# Patient Record
Sex: Female | Born: 1960
Health system: Southern US, Community
[De-identification: ages and names within clinical notes are randomized; demographics above are authoritative.]

## PROBLEM LIST (undated history)

## (undated) DIAGNOSIS — I351 Nonrheumatic aortic (valve) insufficiency: Secondary | ICD-10-CM

## (undated) DIAGNOSIS — Z0389 Encounter for observation for other suspected diseases and conditions ruled out: Secondary | ICD-10-CM

## (undated) DIAGNOSIS — G709 Myoneural disorder, unspecified: Secondary | ICD-10-CM

## (undated) DIAGNOSIS — F3181 Bipolar II disorder: Secondary | ICD-10-CM

## (undated) DIAGNOSIS — G35D Multiple sclerosis, unspecified: Secondary | ICD-10-CM

## (undated) DIAGNOSIS — I471 Supraventricular tachycardia: Principal | ICD-10-CM

## (undated) DIAGNOSIS — T7840XA Allergy, unspecified, initial encounter: Secondary | ICD-10-CM

## (undated) DIAGNOSIS — E559 Vitamin D deficiency, unspecified: Secondary | ICD-10-CM

## (undated) DIAGNOSIS — E785 Hyperlipidemia, unspecified: Secondary | ICD-10-CM

## (undated) DIAGNOSIS — N3281 Overactive bladder: Secondary | ICD-10-CM

## (undated) DIAGNOSIS — I493 Ventricular premature depolarization: Secondary | ICD-10-CM

## (undated) DIAGNOSIS — G35 Multiple sclerosis: Secondary | ICD-10-CM

## (undated) DIAGNOSIS — IMO0001 Reserved for inherently not codable concepts without codable children: Secondary | ICD-10-CM

## (undated) DIAGNOSIS — F419 Anxiety disorder, unspecified: Secondary | ICD-10-CM

## (undated) DIAGNOSIS — I340 Nonrheumatic mitral (valve) insufficiency: Secondary | ICD-10-CM

## (undated) DIAGNOSIS — F32A Depression, unspecified: Secondary | ICD-10-CM

## (undated) DIAGNOSIS — D72819 Decreased white blood cell count, unspecified: Secondary | ICD-10-CM

## (undated) HISTORY — DX: Multiple sclerosis, unspecified: G35.D

## (undated) HISTORY — DX: Hyperlipidemia, unspecified: E78.5

## (undated) HISTORY — DX: Multiple sclerosis: G35

## (undated) HISTORY — DX: Nonrheumatic mitral (valve) insufficiency: I34.0

## (undated) HISTORY — DX: Nonrheumatic aortic (valve) insufficiency: I35.1

## (undated) HISTORY — DX: Depression, unspecified: F32.A

## (undated) HISTORY — PX: CARPAL TUNNEL RELEASE: SHX101

## (undated) HISTORY — DX: Overactive bladder: N32.81

## (undated) HISTORY — DX: Encounter for observation for other suspected diseases and conditions ruled out: Z03.89

## (undated) HISTORY — DX: Reserved for inherently not codable concepts without codable children: IMO0001

## (undated) HISTORY — DX: Allergy, unspecified, initial encounter: T78.40XA

## (undated) HISTORY — DX: Anxiety disorder, unspecified: F41.9

## (undated) HISTORY — DX: Vitamin D deficiency, unspecified: E55.9

## (undated) HISTORY — DX: Ventricular premature depolarization: I49.3

## (undated) HISTORY — DX: Myoneural disorder, unspecified: G70.9

## (undated) HISTORY — DX: Decreased white blood cell count, unspecified: D72.819

## (undated) HISTORY — DX: Supraventricular tachycardia: I47.1

## (undated) HISTORY — DX: Bipolar II disorder: F31.81

## (undated) HISTORY — PX: CHOLECYSTECTOMY: SHX55

## (undated) HISTORY — PX: OTHER SURGICAL HISTORY: SHX169

---

## 2000-07-22 HISTORY — PX: BREAST BIOPSY: SHX20

## 2008-07-22 HISTORY — PX: COLONOSCOPY: SHX174

## 2008-07-22 HISTORY — PX: UPPER GASTROINTESTINAL ENDOSCOPY: SHX188

## 2008-07-22 HISTORY — PX: POLYPECTOMY: SHX149

## 2013-04-20 LAB — HM MAMMOGRAPHY

## 2016-04-09 DIAGNOSIS — N329 Bladder disorder, unspecified: Secondary | ICD-10-CM | POA: Insufficient documentation

## 2016-04-09 DIAGNOSIS — F29 Unspecified psychosis not due to a substance or known physiological condition: Secondary | ICD-10-CM | POA: Insufficient documentation

## 2016-04-09 DIAGNOSIS — I493 Ventricular premature depolarization: Secondary | ICD-10-CM

## 2016-04-09 DIAGNOSIS — G9332 Myalgic encephalomyelitis/chronic fatigue syndrome: Secondary | ICD-10-CM | POA: Insufficient documentation

## 2016-04-09 DIAGNOSIS — R5382 Chronic fatigue, unspecified: Secondary | ICD-10-CM

## 2016-04-09 DIAGNOSIS — R002 Palpitations: Secondary | ICD-10-CM | POA: Insufficient documentation

## 2016-04-09 DIAGNOSIS — G43909 Migraine, unspecified, not intractable, without status migrainosus: Secondary | ICD-10-CM | POA: Insufficient documentation

## 2016-04-09 DIAGNOSIS — N3090 Cystitis, unspecified without hematuria: Secondary | ICD-10-CM | POA: Insufficient documentation

## 2016-04-09 DIAGNOSIS — N6019 Diffuse cystic mastopathy of unspecified breast: Secondary | ICD-10-CM | POA: Insufficient documentation

## 2016-04-09 DIAGNOSIS — M797 Fibromyalgia: Secondary | ICD-10-CM | POA: Insufficient documentation

## 2016-04-09 DIAGNOSIS — F411 Generalized anxiety disorder: Secondary | ICD-10-CM | POA: Insufficient documentation

## 2016-04-09 HISTORY — DX: Ventricular premature depolarization: I49.3

## 2016-06-16 LAB — FECAL OCCULT BLOOD, IMMUNOCHEMICAL: IMMUNOLOGICAL FECAL OCCULT BLOOD TEST: NEGATIVE

## 2017-01-10 DIAGNOSIS — F3181 Bipolar II disorder: Secondary | ICD-10-CM | POA: Diagnosis not present

## 2017-01-28 DIAGNOSIS — B369 Superficial mycosis, unspecified: Secondary | ICD-10-CM | POA: Diagnosis not present

## 2017-02-17 DIAGNOSIS — F3181 Bipolar II disorder: Secondary | ICD-10-CM | POA: Diagnosis not present

## 2017-02-26 ENCOUNTER — Encounter: Payer: Self-pay | Admitting: Family Medicine

## 2017-02-26 ENCOUNTER — Ambulatory Visit (INDEPENDENT_AMBULATORY_CARE_PROVIDER_SITE_OTHER): Payer: Medicare PPO | Admitting: Family Medicine

## 2017-02-26 ENCOUNTER — Telehealth: Payer: Self-pay

## 2017-02-26 VITALS — BP 138/80 | HR 75 | Ht 66.0 in | Wt 223.8 lb

## 2017-02-26 DIAGNOSIS — Z7689 Persons encountering health services in other specified circumstances: Secondary | ICD-10-CM

## 2017-02-26 DIAGNOSIS — F3181 Bipolar II disorder: Secondary | ICD-10-CM

## 2017-02-26 DIAGNOSIS — R300 Dysuria: Secondary | ICD-10-CM

## 2017-02-26 DIAGNOSIS — E785 Hyperlipidemia, unspecified: Secondary | ICD-10-CM | POA: Diagnosis not present

## 2017-02-26 DIAGNOSIS — N3281 Overactive bladder: Secondary | ICD-10-CM

## 2017-02-26 DIAGNOSIS — D72819 Decreased white blood cell count, unspecified: Secondary | ICD-10-CM

## 2017-02-26 DIAGNOSIS — G35 Multiple sclerosis: Secondary | ICD-10-CM | POA: Diagnosis not present

## 2017-02-26 DIAGNOSIS — E559 Vitamin D deficiency, unspecified: Secondary | ICD-10-CM | POA: Diagnosis not present

## 2017-02-26 DIAGNOSIS — F319 Bipolar disorder, unspecified: Secondary | ICD-10-CM | POA: Diagnosis not present

## 2017-02-26 LAB — POCT URINALYSIS DIP (PROADVANTAGE DEVICE)
Bilirubin, UA: NEGATIVE
GLUCOSE UA: NEGATIVE mg/dL
Ketones, POC UA: NEGATIVE mg/dL
LEUKOCYTES UA: NEGATIVE
NITRITE UA: NEGATIVE
Protein Ur, POC: NEGATIVE mg/dL
RBC UA: NEGATIVE
Specific Gravity, Urine: 1.02
UUROB: NEGATIVE
pH, UA: 6.5 (ref 5.0–8.0)

## 2017-02-26 LAB — CBC WITH DIFFERENTIAL/PLATELET
Basophils Absolute: 0 cells/uL (ref 0–200)
Basophils Relative: 0 %
Eosinophils Absolute: 88 cells/uL (ref 15–500)
Eosinophils Relative: 4 %
HEMATOCRIT: 38.2 % (ref 35.0–45.0)
HEMOGLOBIN: 12.5 g/dL (ref 11.7–15.5)
Lymphocytes Relative: 13 %
Lymphs Abs: 286 cells/uL — ABNORMAL LOW (ref 850–3900)
MCH: 28.3 pg (ref 27.0–33.0)
MCHC: 32.7 g/dL (ref 32.0–36.0)
MCV: 86.6 fL (ref 80.0–100.0)
MPV: 9.8 fL (ref 7.5–12.5)
Monocytes Absolute: 220 cells/uL (ref 200–950)
Monocytes Relative: 10 %
NEUTROS PCT: 73 %
Neutro Abs: 1606 cells/uL (ref 1500–7800)
Platelets: 142 10*3/uL (ref 140–400)
RBC: 4.41 MIL/uL (ref 3.80–5.10)
RDW: 13.4 % (ref 11.0–15.0)
WBC: 2.2 10*3/uL — AB (ref 4.0–10.5)

## 2017-02-26 MED ORDER — OXYBUTYNIN CHLORIDE ER 10 MG PO TB24: 10.0000 mg | ORAL_TABLET | Freq: Every day | ORAL | 1 refills | Status: DC

## 2017-02-26 NOTE — Telephone Encounter (Signed)
Release of Information faxed to [facility].

## 2017-02-26 NOTE — Progress Notes (Signed)
Subjective:    Patient ID: Sarah Hogan, female    DOB: [birth year], 56 y.o.   MRN: [id number]  HPI Chief Complaint  Patient presents with  . new pt    new pt get established. possible uti. no other concerns. pt needs refill on oxybutynin which is the only think that her previous pcp filled   She is new to the practice and here to establish care. Moved here 4 months ago from [facility] to be near her younger brother and sister-in-law.  Previous medical care: [facility] [place] she has a complex medical history but that she usually sees specialists for most of her problems.   Her concern today is that she has had some intermittent dysuria. She would like to make sure she does not have a UTI. States she has a difficult time telling because her norm is to have urinary urgency and occasional incontinence. Denies urinary frequency, hematuria, urgency.   Other providers: psychiatrist- Dr. Creig Hines at [facility]. Neurologist - has appointment at [facility] in September.  Cardiologist- Dr. Radford Pax. Dermatologist- needs one.   Bipolar disorder. States she is depressed most days. States her depression is disabling her. Denies SI or HI.  MS- diagnosed in 1990. Reports having mild memory issues.  States she has history of heart palpitations.  Hyperlipidemia- taking pravastatin and Zetia.  Overactive bladder. Has done pelvic floor therapy. States she was offered botox in [facility] but refused. Is taking Oxybutynin. States she was taking Myrbetriq and Oxybutynin in the past together. Her PCP in [place] was managing this for her. States she "fired" her urologist.   Vitamin D deficiency- has been taking 50,000 IU once weekly for several years.   Social history: Lives with alone with her dog and cat in an apartment. 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  Some college.   Diet: states she has an addiction to sugar. Diet is fairly unhealthy.    Reviewed allergies, medications, past medical, surgical, and social  history.   Review of Systems Pertinent positives and negatives in the history of present illness.     Objective:   Physical Exam BP 138/80 Comment: anxious and was late getting to appt  Pulse 75   Ht 5\' 6"  (1.676 m)   Wt 223 lb 12.8 oz (101.5 kg)   LMP 06/28/2008   BMI 36.12 kg/m   Alert and oriented and in no acute distress. Not otherwise examined.   UA dipstick: negative       Assessment & Plan:  Dysuria - Plan: POCT Urinalysis DIP (Proadvantage Device)  Encounter to establish care  Overactive bladder - Plan: POCT Urinalysis DIP (Proadvantage Device), CBC with Differential/Platelet, Comprehensive metabolic panel  MS (multiple sclerosis) (HCC) - Plan: POCT Urinalysis DIP (Proadvantage Device), CBC with Differential/Platelet, Comprehensive metabolic panel  Hyperlipidemia, unspecified hyperlipidemia type - Plan: Lipid panel  Vitamin D deficiency - Plan: VITAMIN D 25 Hydroxy (Vit-D Deficiency, Fractures)  Bipolar 2 disorder (HCC) - Plan: POCT Urinalysis DIP (Proadvantage Device), CBC with Differential/Platelet, Comprehensive metabolic panel  Leukopenia, unspecified type  Discussed that she does not appear to have a UTI. Offered antibiotic based on her symptoms but she declines, states unless she has an obvious infection she does not do well with antibiotics. She will increase water intake and keep me posted if her symptoms worsen.  She will follow up with her specialists for MS and bipolar disorder.  She does not appear to be in any danger and denies SI.  Will check  labs including lipids per patient request.  Vitamin D level ordered.  She brought records from her PCP in [facility] and I will review these.  Follow up pending labs.

## 2017-02-27 LAB — LIPID PANEL
CHOLESTEROL: 221 mg/dL — AB (ref <200)
HDL: 53 mg/dL (ref >50)
LDL CALC: 130 mg/dL — AB (ref <100)
TRIGLYCERIDES: 188 mg/dL — AB (ref <150)
Total CHOL/HDL Ratio: 4.2 Ratio (ref <5.0)
VLDL: 38 mg/dL — AB (ref <30)

## 2017-02-27 LAB — COMPREHENSIVE METABOLIC PANEL
ALBUMIN: 4.2 g/dL (ref 3.6–5.1)
ALT: 12 U/L (ref 6–29)
AST: 15 U/L (ref 10–35)
Alkaline Phosphatase: 50 U/L (ref 33–130)
BUN: 19 mg/dL (ref 7–25)
CALCIUM: 9.3 mg/dL (ref 8.6–10.4)
CO2: 21 mmol/L (ref 20–32)
Chloride: 110 mmol/L (ref 98–110)
Creat: 1.13 mg/dL — ABNORMAL HIGH (ref 0.50–1.05)
GLUCOSE: 87 mg/dL (ref 65–99)
POTASSIUM: 4.4 mmol/L (ref 3.5–5.3)
Sodium: 143 mmol/L (ref 135–146)
Total Bilirubin: 0.3 mg/dL (ref 0.2–1.2)
Total Protein: 6.5 g/dL (ref 6.1–8.1)

## 2017-02-27 LAB — VITAMIN D 25 HYDROXY (VIT D DEFICIENCY, FRACTURES): Vit D, 25-Hydroxy: 40 ng/mL (ref 30–100)

## 2017-03-12 DIAGNOSIS — E785 Hyperlipidemia, unspecified: Secondary | ICD-10-CM | POA: Diagnosis not present

## 2017-03-12 DIAGNOSIS — I10 Essential (primary) hypertension: Secondary | ICD-10-CM | POA: Diagnosis not present

## 2017-03-12 DIAGNOSIS — Z6835 Body mass index (BMI) 35.0-35.9, adult: Secondary | ICD-10-CM | POA: Diagnosis not present

## 2017-03-12 DIAGNOSIS — F330 Major depressive disorder, recurrent, mild: Secondary | ICD-10-CM | POA: Diagnosis not present

## 2017-03-12 DIAGNOSIS — M545 Low back pain: Secondary | ICD-10-CM | POA: Diagnosis not present

## 2017-03-25 DIAGNOSIS — F3181 Bipolar II disorder: Secondary | ICD-10-CM | POA: Diagnosis not present

## 2017-04-03 DIAGNOSIS — G35 Multiple sclerosis: Secondary | ICD-10-CM | POA: Diagnosis not present

## 2017-04-09 ENCOUNTER — Encounter: Payer: Self-pay | Admitting: *Deleted

## 2017-04-22 ENCOUNTER — Ambulatory Visit (INDEPENDENT_AMBULATORY_CARE_PROVIDER_SITE_OTHER): Payer: Medicare PPO | Admitting: Cardiology

## 2017-04-22 ENCOUNTER — Encounter: Payer: Self-pay | Admitting: Cardiology

## 2017-04-22 VITALS — BP 126/74 | HR 67 | Ht 66.0 in | Wt 223.2 lb

## 2017-04-22 DIAGNOSIS — I340 Nonrheumatic mitral (valve) insufficiency: Secondary | ICD-10-CM | POA: Diagnosis not present

## 2017-04-22 DIAGNOSIS — IMO0001 Reserved for inherently not codable concepts without codable children: Secondary | ICD-10-CM | POA: Insufficient documentation

## 2017-04-22 DIAGNOSIS — I471 Supraventricular tachycardia, unspecified: Secondary | ICD-10-CM

## 2017-04-22 DIAGNOSIS — I351 Nonrheumatic aortic (valve) insufficiency: Secondary | ICD-10-CM | POA: Insufficient documentation

## 2017-04-22 DIAGNOSIS — E7800 Pure hypercholesterolemia, unspecified: Secondary | ICD-10-CM | POA: Diagnosis not present

## 2017-04-22 DIAGNOSIS — Z0389 Encounter for observation for other suspected diseases and conditions ruled out: Secondary | ICD-10-CM

## 2017-04-22 DIAGNOSIS — I493 Ventricular premature depolarization: Secondary | ICD-10-CM

## 2017-04-22 HISTORY — DX: Ventricular premature depolarization: I49.3

## 2017-04-22 HISTORY — DX: Supraventricular tachycardia: I47.1

## 2017-04-22 HISTORY — DX: Supraventricular tachycardia, unspecified: I47.10

## 2017-04-22 NOTE — Patient Instructions (Signed)

## 2017-04-22 NOTE — Progress Notes (Signed)
Cardiology Office Note    Date:  04/22/2017   ID:  Sarah Hogan, DOB Jun 06, 1961, MRN [id number]  PCP:  Girtha Rm, NP-C  Cardiologist:  Fransico Him, MD   Chief Complaint  Patient presents with  . Irregular Heart Beat  . Mitral Regurgitation    History of Present Illness:  Sarah Hogan is a 56 y.o. female who is being seen today for the evaluation of PACs, PVCs, SVT  at the request of Raenette Rover, Vickie L, NP-C.  This is a 56yo female with a history of hyperlipidemia, MS, GERD, migraines, bipolar disorder, tobacco abuse with 40 pk year history of smoking and normal coronary arteries by cath for positive stress test done for fagitue in 2015.  She has a history of palpitations and event monitor earlier this year showed SVT with PACs and PVCs. Her echo 08/2016 showed normal LVF with ER 65-70% with mild AR and MR.  She has recently moved to [facility] and is here to establish care.    She is doing well.  She denies any chest pain or pressure,  PND, orthopnea, LE edema, dizziness, palpitations or syncope. She is not used to walking up hills. She lived in [place] where it was flat so she has noticed that she does get DOE with walking her dog but she also has allergies. She is compliant with her meds and is tolerating meds with no SE.     Past Medical History:  Diagnosis Date  . Bipolar 2 disorder ([place])   . Hyperlipidemia   . Leukopenia   . Mild aortic insufficiency    by echo 07/2016  . Mild mitral regurgitation    by echo 07/2016  . MS (multiple sclerosis) ([place])   . Normal coronary arteries cath 2015  . Overactive bladder   . PVC's (premature ventricular contractions) 04/22/2017  . SVT (supraventricular tachycardia) ([place]) 04/22/2017  . Vitamin D deficiency     Past Surgical History:  Procedure Laterality Date  . CARPAL TUNNEL RELEASE    . CHOLECYSTECTOMY      Current Medications: Current Meds  Medication Sig  . baclofen (LIORESAL) 10 MG tablet Take 10 mg by mouth 3 (three) times  daily.  . busPIRone (BUSPAR) 10 MG tablet Take 10 mg by mouth daily.  . clorazepate (TRANXENE) 3.75 MG tablet Take 3.75 mg by mouth as needed for anxiety.  Marland Kitchen ezetimibe (ZETIA) 10 MG tablet Take 10 mg by mouth daily.  . Fingolimod HCl (GILENYA) 0.5 MG CAPS Take 1 capsule by mouth daily.  . metoprolol succinate (TOPROL XL) 50 MG 24 hr tablet Take 50 mg by mouth daily. Take with or immediately following a meal. MUST HAVE BRAND NAME ONLY  . modafinil (PROVIGIL) 100 MG tablet Take 100 mg by mouth as needed.  . NON FORMULARY Uses [employer] things  . OXcarbazepine (TRILEPTAL) 150 MG tablet Take 150 mg by mouth 2 (two) times daily.  Marland Kitchen oxybutynin (DITROPAN-XL) 10 MG 24 hr tablet Take 1 tablet (10 mg total) by mouth at bedtime.  . pravastatin (PRAVACHOL) 40 MG tablet Take 40 mg by mouth daily.  . pregabalin (LYRICA) 50 MG capsule Take 50 mg by mouth daily.  Marland Kitchen topiramate (TOPAMAX) 50 MG tablet Take 50 mg by mouth 2 (two) times daily.  Marland Kitchen venlafaxine XR (EFFEXOR-XR) 75 MG 24 hr capsule Take 75 mg by mouth daily with breakfast.  . Vitamin D, Ergocalciferol, (DRISDOL) 50000 units CAPS capsule Take 50,000 Units by mouth every 7 (seven) days.  Allergies:   Amoxicillin   Social History   Social History  . Marital status: Single    Spouse name: N/A  . Number of children: N/A  . Years of education: N/A   Social History Main Topics  . Smoking status: Former Smoker    Packs/day: 1.50    Years: 20.00    Types: Cigarettes    Quit date: 10/28/1994  . Smokeless tobacco: Never Used  . Alcohol use Yes     Comment: rarely   . Drug use: No  . Sexual activity: Not Asked   Other Topics Concern  . None   Social History Narrative  . None     Family History:  The patient's family history is not on file.   ROS:   Please see the history of present illness.    ROS   She complains of depression, anxiety, and balance problems. All other systems reviewed and are negative.  No flowsheet data  found.     PHYSICAL EXAM:   VS:  BP 126/74   Pulse 67   Ht 5\' 6"  (1.676 m)   Wt 223 lb 3.2 oz (101.2 kg)   LMP 06/28/2008   BMI 36.03 kg/m    GEN: Well nourished, well developed, in no acute distress  HEENT: normal  Neck: no JVD, carotid bruits, or masses Cardiac: RRR; no murmurs, rubs, or gallops,no edema.  Intact distal pulses bilaterally.  Respiratory:  clear to auscultation bilaterally, normal work of breathing GI: soft, nontender, nondistended, + BS MS: no deformity or atrophy  Skin: warm and dry, no rash Neuro:  Alert and Oriented x 3, Strength and sensation are intact Psych: euthymic mood, full affect  Wt Readings from Last 3 Encounters:  04/22/17 223 lb 3.2 oz (101.2 kg)  02/26/17 223 lb 12.8 oz (101.5 kg)      Studies/Labs Reviewed:   EKG:  EKG is ordered today.  The ekg ordered today demonstrates NSR at 67bpm with nonspecific T wave abnormality  Recent Labs: 02/26/2017: ALT 12; BUN 19; Creat 1.13; Hemoglobin 12.5; Platelets 142; Potassium 4.4; Sodium 143   Lipid Panel    Component Value Date/Time   CHOL 221 (H) 02/26/2017 1126   TRIG 188 (H) 02/26/2017 1126   HDL 53 02/26/2017 1126   CHOLHDL 4.2 02/26/2017 1126   VLDL 38 (H) 02/26/2017 1126   LDLCALC 130 (H) 02/26/2017 1126    Additional studies/ records that were reviewed today include:  Records from prior cardiologist    ASSESSMENT:    1. SVT (supraventricular tachycardia) ([place])   2. PVC's (premature ventricular contractions)   3. Mild mitral regurgitation   4. Pure hypercholesterolemia      PLAN:  In order of problems listed above:  1. SVT - this was noted on event monitor earlier this year.  This is well controlled on Toprol XL 50mg  daily.    2. PVCs - these are fairly asymptomatic and well controlled on BB.  3. Mild MR/AR by echo 07/2016 - repeat echo in 3 years.    4. Hyperlipidemia - followed by PCP.  She will continue on Pravastatin 40mg  daily and Zetia 10mg  daily.  Her last LDL  was 130 and recommend that it be maintained < 130.    Medication Adjustments/Labs and Tests Ordered: Current medicines are reviewed at length with the patient today.  Concerns regarding medicines are outlined above.  Medication changes, Labs and Tests ordered today are listed in the Patient Instructions below.  There are no  Patient Instructions on file for this visit.   Signed, Fransico Him, MD  04/22/2017 2:13 PM    [facility] [place], [place], [place]  [address] Phone: [phone]; Fax: [phone]

## 2017-04-28 ENCOUNTER — Ambulatory Visit (INDEPENDENT_AMBULATORY_CARE_PROVIDER_SITE_OTHER): Payer: Medicare PPO | Admitting: Family Medicine

## 2017-04-28 ENCOUNTER — Encounter: Payer: Self-pay | Admitting: Family Medicine

## 2017-04-28 VITALS — BP 112/68 | HR 61 | Temp 97.5°F | Wt 230.6 lb

## 2017-04-28 DIAGNOSIS — J209 Acute bronchitis, unspecified: Secondary | ICD-10-CM | POA: Diagnosis not present

## 2017-04-28 DIAGNOSIS — R058 Other specified cough: Secondary | ICD-10-CM

## 2017-04-28 DIAGNOSIS — R05 Cough: Secondary | ICD-10-CM

## 2017-04-28 MED ORDER — SULFAMETHOXAZOLE-TRIMETHOPRIM 800-160 MG PO TABS: 1.0000 | ORAL_TABLET | Freq: Two times a day (BID) | ORAL | 0 refills | Status: DC

## 2017-04-28 NOTE — Patient Instructions (Signed)
If you are not back to baseline after completing the antibiotic call me.

## 2017-04-28 NOTE — Progress Notes (Signed)
Chief Complaint  Patient presents with  . Cough    lots of mucous in chest   Subjective:  Sarah Hogan is a 56 y.o. female who presents for a 3-4 week history of URI symptoms that started with rhinorrhea, sneezing, cough.and fever for 2 days. She now complains of persistent cough as well. States she typically is not sick for long periods but her immune system is weak due to MS and medications.   Denies ear pain, sore throat, chest pain, palpitations, abdominal pain, N/V/D. No LE edema.   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 She would like a letter stating that she is healthy enough to participate in more vigorous exercise. She recently saw her cardiologist but did not ask her about this.   Treatment to date: Delsym and Mucinex .  Denies sick contacts.  No other aggravating or relieving factors.  No other c/o.  ROS as in subjective.   Objective: Vitals:   04/28/17 1427  BP: 112/68  Pulse: 61  Temp: (!) 97.5 F (36.4 C)  SpO2: 99%    General appearance: Alert, WD/WN, no distress, mildly ill appearing                             Skin: warm, no rash                           Head: no sinus tenderness                            Eyes: conjunctiva normal, corneas clear, PERRLA                            Ears: pearly TMs, external ear canals normal                          Nose: septum midline, turbinates swollen, with erythema and clear discharge             Mouth/throat: MMM, tongue normal, mild pharyngeal erythema                           Neck: supple, no adenopathy, no thyromegaly, nontender                          Heart: RRR, normal S1, S2, no murmurs                         Lungs: CTA bilaterally, no wheezes, rales, or rhonchi      Assessment: Cough present for greater than 3 weeks  Acute bronchitis, unspecified organism   Plan: Discussed diagnosis and treatment of persistent cough. She is allergic to Amoxicillin and high risk of QT prolongation  with Z-pak and MS medications. She states she has done well with Bactrim in the past and would like this to be prescribed.   Suggested symptomatic OTC remedies. Nasal saline spray for congestion.  Tylenol or Ibuprofen OTC for fever and malaise.  Call/return if her symptoms worsen or if not back to baseline after completing the antibiotic. She is aware that we may need to send her for a chest XR or switch her antibiotic to Doxycycline or a  fluoroquinolone if not improving.

## 2017-04-30 ENCOUNTER — Telehealth: Payer: Self-pay | Admitting: Cardiology

## 2017-04-30 NOTE — Telephone Encounter (Signed)
New Message  Pt call requesting to speak with RN about getting a letter of consent from Dr. Radford Pax for pt to be able to take an exercise class. Please call back to discuss

## 2017-04-30 NOTE — Telephone Encounter (Signed)
Pt calling to inform Dr Radford Pax that she will need a clearance letter to provide her new Trainer at [employer].  Pt states she is trying to get into an exercise regimen and has started to attend [employer].  Pt states she is getting ready to start exercising with a Trainer at the gym, and before she can start sessions she will need a clearance note written by Dr Radford Pax.  Informed the pt that I will forward this request to Dr Radford Pax to advise on letter needed, and follow-up with the pt once this is received. Pt verbalized understanding and agrees with this plan.

## 2017-05-02 ENCOUNTER — Encounter: Payer: Self-pay | Admitting: *Deleted

## 2017-05-02 NOTE — Telephone Encounter (Signed)
Patient is stable from a cardiac standpoint to participate in an exercise program.

## 2017-05-02 NOTE — Telephone Encounter (Signed)
Notified the pt that her clearance letter to exercise with her trainer was written by Dr Radford Pax and available to pick up at her earliest convenience.  Per the pt, she prefers this to be mailed to her confirmed mailing address on file.  Informed the pt that I will mail this today.  Pt verbalized understanding and gracious for all the assistance provided.

## 2017-05-06 DIAGNOSIS — F3181 Bipolar II disorder: Secondary | ICD-10-CM | POA: Diagnosis not present

## 2017-05-27 ENCOUNTER — Telehealth: Payer: Self-pay | Admitting: Family Medicine

## 2017-05-27 NOTE — Telephone Encounter (Signed)
Recv'd fax from [facility] t# [phone] requesting refill Oxybutynin ER 10mg , I do not see where pt uses this pharmacy so I left message for pt

## 2017-05-28 MED ORDER — OXYBUTYNIN CHLORIDE ER 10 MG PO TB24: 10.0000 mg | ORAL_TABLET | Freq: Every day | ORAL | 1 refills | Status: DC

## 2017-05-28 NOTE — Telephone Encounter (Signed)
Is this okay to refill? 

## 2017-05-28 NOTE — Telephone Encounter (Signed)
Ok

## 2017-05-28 NOTE — Telephone Encounter (Signed)
Pt called back & states she does want all her maintenance meds filled at [facility] instead of [facility] mail order.  Please send in refill Oxybutynin ER 10mg  90 days

## 2017-05-28 NOTE — Telephone Encounter (Signed)
Sent in med to [facility]

## 2017-06-03 ENCOUNTER — Ambulatory Visit: Payer: Medicare PPO | Admitting: Psychology

## 2017-06-10 ENCOUNTER — Ambulatory Visit: Payer: Medicare PPO | Admitting: Family Medicine

## 2017-06-16 DIAGNOSIS — G35 Multiple sclerosis: Secondary | ICD-10-CM | POA: Diagnosis not present

## 2017-06-25 ENCOUNTER — Ambulatory Visit: Payer: Medicare PPO | Admitting: Psychology

## 2017-06-25 DIAGNOSIS — F3130 Bipolar disorder, current episode depressed, mild or moderate severity, unspecified: Secondary | ICD-10-CM

## 2017-06-30 ENCOUNTER — Ambulatory Visit: Payer: Medicare PPO | Admitting: Psychology

## 2017-07-08 DIAGNOSIS — F3181 Bipolar II disorder: Secondary | ICD-10-CM | POA: Diagnosis not present

## 2017-08-06 ENCOUNTER — Ambulatory Visit: Payer: Medicare PPO | Admitting: Psychology

## 2017-08-06 DIAGNOSIS — F3181 Bipolar II disorder: Secondary | ICD-10-CM

## 2017-08-19 DIAGNOSIS — Z87891 Personal history of nicotine dependence: Secondary | ICD-10-CM | POA: Diagnosis not present

## 2017-08-19 DIAGNOSIS — G35 Multiple sclerosis: Secondary | ICD-10-CM | POA: Diagnosis not present

## 2017-08-19 DIAGNOSIS — F3181 Bipolar II disorder: Secondary | ICD-10-CM | POA: Diagnosis not present

## 2017-08-20 ENCOUNTER — Ambulatory Visit (INDEPENDENT_AMBULATORY_CARE_PROVIDER_SITE_OTHER): Payer: Medicare PPO | Admitting: Psychology

## 2017-08-20 DIAGNOSIS — F3181 Bipolar II disorder: Secondary | ICD-10-CM

## 2017-08-30 DIAGNOSIS — R399 Unspecified symptoms and signs involving the genitourinary system: Secondary | ICD-10-CM | POA: Diagnosis not present

## 2017-09-03 ENCOUNTER — Ambulatory Visit (INDEPENDENT_AMBULATORY_CARE_PROVIDER_SITE_OTHER): Payer: Medicare PPO | Admitting: Psychology

## 2017-09-03 DIAGNOSIS — F3181 Bipolar II disorder: Secondary | ICD-10-CM | POA: Diagnosis not present

## 2017-09-17 ENCOUNTER — Ambulatory Visit (INDEPENDENT_AMBULATORY_CARE_PROVIDER_SITE_OTHER): Payer: Medicare PPO | Admitting: Psychology

## 2017-09-17 DIAGNOSIS — F3181 Bipolar II disorder: Secondary | ICD-10-CM | POA: Diagnosis not present

## 2017-09-30 DIAGNOSIS — F3181 Bipolar II disorder: Secondary | ICD-10-CM | POA: Diagnosis not present

## 2017-10-01 ENCOUNTER — Ambulatory Visit (INDEPENDENT_AMBULATORY_CARE_PROVIDER_SITE_OTHER): Payer: Medicare PPO | Admitting: Psychology

## 2017-10-01 DIAGNOSIS — F3181 Bipolar II disorder: Secondary | ICD-10-CM | POA: Diagnosis not present

## 2017-10-15 ENCOUNTER — Ambulatory Visit (INDEPENDENT_AMBULATORY_CARE_PROVIDER_SITE_OTHER): Payer: Medicare PPO | Admitting: Psychology

## 2017-10-15 DIAGNOSIS — F3181 Bipolar II disorder: Secondary | ICD-10-CM

## 2017-10-29 ENCOUNTER — Ambulatory Visit (INDEPENDENT_AMBULATORY_CARE_PROVIDER_SITE_OTHER): Payer: Medicare PPO | Admitting: Psychology

## 2017-10-29 DIAGNOSIS — F3181 Bipolar II disorder: Secondary | ICD-10-CM | POA: Diagnosis not present

## 2017-11-12 ENCOUNTER — Ambulatory Visit: Payer: Medicare PPO | Admitting: Psychology

## 2017-11-17 DIAGNOSIS — F3181 Bipolar II disorder: Secondary | ICD-10-CM | POA: Diagnosis not present

## 2017-11-24 ENCOUNTER — Other Ambulatory Visit: Payer: Self-pay | Admitting: Family Medicine

## 2017-11-24 NOTE — Telephone Encounter (Signed)
Is this okay to refill? 

## 2017-11-24 NOTE — Telephone Encounter (Signed)
ok 

## 2017-11-26 ENCOUNTER — Ambulatory Visit (INDEPENDENT_AMBULATORY_CARE_PROVIDER_SITE_OTHER): Payer: Medicare PPO | Admitting: Psychology

## 2017-11-26 DIAGNOSIS — F3181 Bipolar II disorder: Secondary | ICD-10-CM | POA: Diagnosis not present

## 2017-12-09 NOTE — Progress Notes (Signed)
Sarah Hogan is a 57 y.o. female who presents for annual wellness visit and follow-up on chronic medical conditions.  She has the following concerns:  Taking prednisone for MS for flare and started this 2 days ago. . Diagnosed with MS 29 years ago.  Reports a new medication, Geodon - for Bipolar depression. States she was doing well on [place] but insurance did not cover it. She reports doing fine on this medication also.  Sees her psychiatrist, Dr. Creig Hines every 6 weeks. She is also seeing her counselor.  Under the care of neurology for MS.   Heart rate is due to medications. She is under the care of Dr. Radford Pax, cardiology.   States overactive bladder and incontinence is stable. She has seen a urogynecologist in the past in [facility].  Thinks she has had painful urination over the past few days.   History of vitamin D deficiency and is taking a supplement.     Immunization History  Administered Date(s) Administered  . Influenza Split 04/04/2013, 07/22/2013  . Influenza, Seasonal, Injecte, Preservative Fre 04/20/2009  . Influenza,inj,quad, With Preservative 07/22/2014  . Tdap 07/22/2000   Last Pap smear: 2017 in [place]. Denies history of abnormal  Last mammogram: 2017 in [place].  Last colonoscopy: colonoscopy in [place] and negative. cologuard march 2018- normal  Last DEXA: 2008 in [place] and normal no steroids since 2010 and on steroids now this week.  Dentist: none- not since 2016 Ophtho: beginning this year. [employer] Exercise: MS water class 3 days. Walks dog  Other doctors caring for patient include:  Psychiatrist- Dr. Creig Hines at [facility].  Neurologist - Dr. George Hugh  Cardiologist- Dr. Radford Pax.  Therapist- Dr. Marlowe Sax at [facility]  accupuncturist- for bladder incontinence.   Vitamin D deficiency.     Depression screen:  See questionnaire below.  Depression screen [facility] 2/9 12/10/2017 02/26/2017  Decreased Interest 3 3  Down, Depressed, Hopeless 3 3  PHQ - 2 Score 6 6   Altered sleeping 0 3  Tired, decreased energy 3 3  Change in appetite 1 3  Feeling bad or failure about yourself  3 3  Trouble concentrating 3 3  Moving slowly or fidgety/restless 3 0  Suicidal thoughts 0 0  PHQ-9 Score 19 21  Difficult doing work/chores Somewhat difficult Somewhat difficult    Fall Risk Screen: see questionnaire below. Fall Risk  12/10/2017 02/26/2017  Falls in the past year? Yes Yes  Number falls in past yr: 2 or more 2 or more  Injury with Fall? No Yes  Risk for fall due to : - History of fall(s);Impaired balance/gait    ADL screen:  See questionnaire below Functional Status Survey: Is the patient deaf or have difficulty hearing?: No Does the patient have difficulty seeing, even when wearing glasses/contacts?: No Does the patient have difficulty concentrating, remembering, or making decisions?: Yes Does the patient have difficulty walking or climbing stairs?: Yes(due to MS) Does the patient have difficulty dressing or bathing?: No Does the patient have difficulty doing errands alone such as visiting a doctor's office or shopping?: No   End of Life Discussion:  Patient does not have a living will and medical power of attorney.   Review of Systems Constitutional: -fever, -chills, -sweats, -unexpected weight change, -anorexia, -fatigue Allergy: -sneezing, -itching, -congestion Dermatology: denies changing moles, rash, lumps, new worrisome lesions ENT: -runny nose, -ear pain, -sore throat, -hoarseness, -sinus pain, -teeth pain, -tinnitus, -hearing loss, -epistaxis Cardiology:  -chest pain, -palpitations, -edema, -orthopnea, -paroxysmal nocturnal dyspnea  Respiratory: -cough, -shortness of breath, -dyspnea on exertion, -wheezing, -hemoptysis Gastroenterology: -abdominal pain, -nausea, -vomiting, -diarrhea, -constipation, -blood in stool, -changes in bowel movement, -dysphagia Hematology: -bleeding or bruising problems Musculoskeletal: -arthralgias, -myalgias,  -joint swelling, -back pain, -neck pain, -cramping, -gait changes Ophthalmology: -vision changes, -eye redness, -itching, -discharge Urology: -+dysuria, -difficulty urinating, -hematuria, -urinary frequency, -urgency, + incontinence Neurology: -headache, -weakness, -tingling, +intermittent LE numbness, -speech abnormality, -memory loss, +falls (MS), -dizziness Psychology:  +depressed mood, -agitation, -sleep problems    PHYSICAL EXAM:  BP 140/80   Pulse (!) 56   Ht 5' 5.5" (1.664 m)   Wt 218 lb 3.2 oz (99 kg)   LMP 06/28/2008   BMI 35.76 kg/m   General Appearance: Alert, cooperative, no distress, appears stated age Head: Normocephalic, without obvious abnormality, atraumatic Eyes: PERRL, conjunctiva/corneas clear, EOM's intact, fundi benign Ears: Normal TM's and external ear canals Nose: Nares normal, mucosa normal, no drainage or sinus tenderness Throat: Lips, mucosa, and tongue normal; teeth and gums normal Neck: Supple, no lymphadenopathy; thyroid: no enlargement/tenderness/nodules; no carotid bruit or JVD Back: Spine nontender, no curvature, ROM normal, no CVA tenderness Lungs: Clear to auscultation bilaterally without wheezes, rales or ronchi; respirations unlabored Chest Wall: No tenderness or deformity Heart: Regular rate and rhythm, S1 and S2 normal, no murmur, rub or gallop Breast Exam: No tenderness, masses, or nipple discharge or inversion. No axillary lymphadenopathy Abdomen: Soft, non-tender, nondistended, normoactive bowel sounds, no masses, no hepatosplenomegaly Genitalia: declines. Will see OB/GYN Extremities: No clubbing, cyanosis or edema Pulses: 2+ and symmetric all extremities Skin: Skin color, texture, turgor normal, no rashes or lesions Lymph nodes: Cervical, supraclavicular, and axillary nodes normal Neurologic: CNII-XII intact, normal strength, sensation and gait; reflexes 2+ and symmetric throughout Psych: Normal mood, affect, hygiene and  grooming.  ASSESSMENT/PLAN: Medicare annual wellness visit, subsequent  Vaccine counseling  Multiple sclerosis ([place]) - Plan: CBC with Differential/Platelet, Comprehensive metabolic panel  Overactive bladder - Plan: CBC with Differential/Platelet, Comprehensive metabolic panel, CANCELED: POCT Urinalysis DIP (Proadvantage Device)  Pure hypercholesterolemia - Plan: Lipid panel  Bipolar 1 disorder (HCC)  Vitamin D deficiency - Plan: VITAMIN D 25 Hydroxy (Vit-D Deficiency, Fractures)  Medication management - Plan: VITAMIN D 25 Hydroxy (Vit-D Deficiency, Fractures), Lipid panel  Dysuria - Plan: CBC with Differential/Platelet, CANCELED: POCT Urinalysis DIP (Proadvantage Device)  Leukopenia, unspecified type - Plan: CBC with Differential/Platelet, Comprehensive metabolic panel  She is unhappy about her health. She is under the care of multiple specialists  She is unable to leave a urine specimen today. She may return for this if symptoms persist. Offered to refer to urology and she would like to do research on this first.  She plans to schedule with a gynecologist. A list was given.  Continue seeing psychiatrist and on psych medications. Does not appear to be in any danger.  Appears to be up to date with pap smear, bone density and colonoscopy.  Does not meet criteria for low dose CT lung cancer screening.  Mammogram ordered. She will call to schedule.  Vaccine counseling done.  Prescription given for Shingrix and Tdap.  MOST form filled out today.  Advance directive counseling done and paperwork given to take with her.  Reports being up to date on HIV and hepatitis C screening.  We will attempt to get records from [facility] regarding immunizations and health maintenance.  Follow up pending labs.   Discussed monthly self breast exams and yearly mammograms; at least 30 minutes of aerobic activity at least 5 days/week and  weight-bearing exercise 2x/week; proper sunscreen use reviewed; healthy  diet, including goals of calcium and vitamin D intake and alcohol recommendations (less than or equal to 1 drink/day) reviewed; regular seatbelt use; changing batteries in smoke detectors.  Immunization recommendations discussed.  Colonoscopy recommendations reviewed   Medicare Attestation I have personally reviewed: The patient's medical and social history Their use of alcohol, tobacco or illicit drugs Their current medications and supplements The patient's functional ability including ADLs,fall risks, home safety risks, cognitive, and hearing and visual impairment Diet and physical activities Evidence for depression or mood disorders  The patient's weight, height, and BMI have been recorded in the chart.  I have made referrals, counseling, and provided education to the patient based on review of the above and I have provided the patient with a written personalized care plan for preventive services.     Harland Dingwall, NP-C   12/10/2017

## 2017-12-10 ENCOUNTER — Ambulatory Visit: Payer: Medicare PPO | Admitting: Family Medicine

## 2017-12-10 ENCOUNTER — Encounter: Payer: Self-pay | Admitting: Family Medicine

## 2017-12-10 VITALS — BP 140/80 | HR 56 | Ht 65.5 in | Wt 218.2 lb

## 2017-12-10 DIAGNOSIS — N3281 Overactive bladder: Secondary | ICD-10-CM | POA: Diagnosis not present

## 2017-12-10 DIAGNOSIS — Z79899 Other long term (current) drug therapy: Secondary | ICD-10-CM | POA: Diagnosis not present

## 2017-12-10 DIAGNOSIS — Z7189 Other specified counseling: Secondary | ICD-10-CM | POA: Diagnosis not present

## 2017-12-10 DIAGNOSIS — Z0000 Encounter for general adult medical examination without abnormal findings: Secondary | ICD-10-CM

## 2017-12-10 DIAGNOSIS — E559 Vitamin D deficiency, unspecified: Secondary | ICD-10-CM | POA: Insufficient documentation

## 2017-12-10 DIAGNOSIS — E7800 Pure hypercholesterolemia, unspecified: Secondary | ICD-10-CM | POA: Diagnosis not present

## 2017-12-10 DIAGNOSIS — Z7185 Encounter for immunization safety counseling: Secondary | ICD-10-CM

## 2017-12-10 DIAGNOSIS — G35 Multiple sclerosis: Secondary | ICD-10-CM | POA: Diagnosis not present

## 2017-12-10 DIAGNOSIS — R300 Dysuria: Secondary | ICD-10-CM | POA: Diagnosis not present

## 2017-12-10 DIAGNOSIS — D72819 Decreased white blood cell count, unspecified: Secondary | ICD-10-CM

## 2017-12-10 DIAGNOSIS — F319 Bipolar disorder, unspecified: Secondary | ICD-10-CM

## 2017-12-10 NOTE — Patient Instructions (Signed)
Obgyn Offices:   [facility] [address] [place], [place] [address] [phone]  [facility] Address: [address] [place], [place] [address] Phone: [phone]  [facility] [address] [place], [place] [address] Phone: [phone]   [facility] [address] [place], [place] [address] Phone: [phone] 

## 2017-12-11 LAB — COMPREHENSIVE METABOLIC PANEL
A/G RATIO: 2.3 — AB (ref 1.2–2.2)
ALT: 16 IU/L (ref 0–32)
AST: 15 IU/L (ref 0–40)
Albumin: 4.5 g/dL (ref 3.5–5.5)
Alkaline Phosphatase: 61 IU/L (ref 39–117)
BILIRUBIN TOTAL: 0.3 mg/dL (ref 0.0–1.2)
BUN / CREAT RATIO: 12 (ref 9–23)
BUN: 14 mg/dL (ref 6–24)
CO2: 24 mmol/L (ref 20–29)
Calcium: 9.6 mg/dL (ref 8.7–10.2)
Chloride: 108 mmol/L — ABNORMAL HIGH (ref 96–106)
Creatinine, Ser: 1.18 mg/dL — ABNORMAL HIGH (ref 0.57–1.00)
GFR calc Af Amer: 59 mL/min/{1.73_m2} — ABNORMAL LOW (ref >59)
GFR calc non Af Amer: 51 mL/min/{1.73_m2} — ABNORMAL LOW (ref >59)
Globulin, Total: 2 g/dL (ref 1.5–4.5)
Glucose: 112 mg/dL — ABNORMAL HIGH (ref 65–99)
Potassium: 5.2 mmol/L (ref 3.5–5.2)
SODIUM: 143 mmol/L (ref 134–144)
Total Protein: 6.5 g/dL (ref 6.0–8.5)

## 2017-12-11 LAB — CBC WITH DIFFERENTIAL/PLATELET
BASOS: 0 %
Basophils Absolute: 0 10*3/uL (ref 0.0–0.2)
EOS (ABSOLUTE): 0.1 10*3/uL (ref 0.0–0.4)
Eos: 1 %
Hematocrit: 38.9 % (ref 34.0–46.6)
Hemoglobin: 12.9 g/dL (ref 11.1–15.9)
Immature Grans (Abs): 0 10*3/uL (ref 0.0–0.1)
Immature Granulocytes: 0 %
Lymphocytes Absolute: 0.8 10*3/uL (ref 0.7–3.1)
Lymphs: 17 %
MCH: 28.3 pg (ref 26.6–33.0)
MCHC: 33.2 g/dL (ref 31.5–35.7)
MCV: 85 fL (ref 79–97)
MONOS ABS: 0.2 10*3/uL (ref 0.1–0.9)
Monocytes: 4 %
NEUTROS ABS: 3.7 10*3/uL (ref 1.4–7.0)
NEUTROS PCT: 78 %
PLATELETS: 187 10*3/uL (ref 150–450)
RBC: 4.56 x10E6/uL (ref 3.77–5.28)
RDW: 13.7 % (ref 12.3–15.4)
WBC: 4.7 10*3/uL (ref 3.4–10.8)

## 2017-12-11 LAB — VITAMIN D 25 HYDROXY (VIT D DEFICIENCY, FRACTURES): Vit D, 25-Hydroxy: 47.7 ng/mL (ref 30.0–100.0)

## 2017-12-11 LAB — LIPID PANEL
CHOLESTEROL TOTAL: 142 mg/dL (ref 100–199)
Chol/HDL Ratio: 2.6 ratio (ref 0.0–4.4)
HDL: 54 mg/dL (ref >39)
LDL CALC: 67 mg/dL (ref 0–99)
TRIGLYCERIDES: 103 mg/dL (ref 0–149)
VLDL CHOLESTEROL CAL: 21 mg/dL (ref 5–40)

## 2017-12-17 ENCOUNTER — Encounter: Payer: Self-pay | Admitting: *Deleted

## 2017-12-17 ENCOUNTER — Telehealth: Payer: Self-pay | Admitting: Family Medicine

## 2017-12-17 NOTE — Telephone Encounter (Signed)
Received requested mammogram from [facility]

## 2017-12-19 ENCOUNTER — Encounter: Payer: Self-pay | Admitting: Family Medicine

## 2017-12-23 ENCOUNTER — Ambulatory Visit (INDEPENDENT_AMBULATORY_CARE_PROVIDER_SITE_OTHER): Payer: Medicare PPO | Admitting: Psychology

## 2017-12-23 DIAGNOSIS — F3181 Bipolar II disorder: Secondary | ICD-10-CM | POA: Diagnosis not present

## 2017-12-29 DIAGNOSIS — F3181 Bipolar II disorder: Secondary | ICD-10-CM | POA: Diagnosis not present

## 2018-01-14 ENCOUNTER — Ambulatory Visit: Payer: Self-pay | Admitting: Psychology

## 2018-01-15 ENCOUNTER — Telehealth: Payer: Self-pay | Admitting: Family Medicine

## 2018-01-15 NOTE — Telephone Encounter (Signed)
Call [place] in [facility] to inquire about request sent for Pap and Bone Density. I was advised they do not either on file for pt.

## 2018-01-23 ENCOUNTER — Other Ambulatory Visit: Payer: Self-pay | Admitting: Family Medicine

## 2018-01-23 NOTE — Telephone Encounter (Signed)
Is this okay to refill? 

## 2018-01-23 NOTE — Telephone Encounter (Signed)
ok 

## 2018-02-02 ENCOUNTER — Ambulatory Visit (INDEPENDENT_AMBULATORY_CARE_PROVIDER_SITE_OTHER): Payer: Medicare PPO | Admitting: Psychology

## 2018-02-02 DIAGNOSIS — F3181 Bipolar II disorder: Secondary | ICD-10-CM

## 2018-02-10 DIAGNOSIS — F3181 Bipolar II disorder: Secondary | ICD-10-CM | POA: Diagnosis not present

## 2018-02-16 ENCOUNTER — Ambulatory Visit (INDEPENDENT_AMBULATORY_CARE_PROVIDER_SITE_OTHER): Payer: Medicare PPO | Admitting: Psychology

## 2018-02-16 DIAGNOSIS — F3181 Bipolar II disorder: Secondary | ICD-10-CM | POA: Diagnosis not present

## 2018-03-02 ENCOUNTER — Ambulatory Visit: Payer: Medicare PPO | Admitting: Psychology

## 2018-03-18 ENCOUNTER — Ambulatory Visit: Payer: Self-pay | Admitting: Psychology

## 2018-03-26 ENCOUNTER — Other Ambulatory Visit: Payer: Self-pay | Admitting: Family Medicine

## 2018-03-26 NOTE — Telephone Encounter (Signed)
Ok to give 3 months and then follow up

## 2018-03-26 NOTE — Telephone Encounter (Signed)
Is this okay to refill? 

## 2018-03-31 ENCOUNTER — Ambulatory Visit: Payer: Medicare PPO | Admitting: Psychology

## 2018-04-07 DIAGNOSIS — F3181 Bipolar II disorder: Secondary | ICD-10-CM | POA: Diagnosis not present

## 2018-04-15 ENCOUNTER — Ambulatory Visit: Payer: Medicare PPO | Admitting: Psychology

## 2018-04-16 DIAGNOSIS — G35 Multiple sclerosis: Secondary | ICD-10-CM | POA: Diagnosis not present

## 2018-04-16 DIAGNOSIS — N319 Neuromuscular dysfunction of bladder, unspecified: Secondary | ICD-10-CM | POA: Diagnosis not present

## 2018-04-28 DIAGNOSIS — G35 Multiple sclerosis: Secondary | ICD-10-CM | POA: Diagnosis not present

## 2018-04-28 DIAGNOSIS — R9082 White matter disease, unspecified: Secondary | ICD-10-CM | POA: Diagnosis not present

## 2018-04-28 DIAGNOSIS — G9389 Other specified disorders of brain: Secondary | ICD-10-CM | POA: Diagnosis not present

## 2018-04-29 ENCOUNTER — Ambulatory Visit: Payer: Medicare PPO | Admitting: Psychology

## 2018-05-05 ENCOUNTER — Other Ambulatory Visit: Payer: Self-pay

## 2018-05-05 MED ORDER — OXCARBAZEPINE 150 MG PO TABS: 150.0000 mg | ORAL_TABLET | Freq: Two times a day (BID) | ORAL | 0 refills | Status: DC

## 2018-05-05 MED ORDER — OXCARBAZEPINE 150 MG PO TABS: 150.0000 mg | ORAL_TABLET | Freq: Two times a day (BID) | ORAL | 1 refills | Status: DC

## 2018-05-15 ENCOUNTER — Other Ambulatory Visit: Payer: Self-pay

## 2018-05-15 MED ORDER — TOPIRAMATE 50 MG PO TABS: 50.0000 mg | ORAL_TABLET | Freq: Every day | ORAL | 1 refills | Status: DC

## 2018-05-19 ENCOUNTER — Encounter: Payer: Self-pay | Admitting: Medical

## 2018-05-19 ENCOUNTER — Ambulatory Visit: Payer: Medicare PPO | Admitting: Medical

## 2018-05-19 VITALS — BP 90/54 | HR 60 | Ht 65.5 in | Wt 208.1 lb

## 2018-05-19 DIAGNOSIS — I351 Nonrheumatic aortic (valve) insufficiency: Secondary | ICD-10-CM

## 2018-05-19 DIAGNOSIS — I340 Nonrheumatic mitral (valve) insufficiency: Secondary | ICD-10-CM

## 2018-05-19 DIAGNOSIS — I471 Supraventricular tachycardia: Secondary | ICD-10-CM

## 2018-05-19 DIAGNOSIS — R002 Palpitations: Secondary | ICD-10-CM

## 2018-05-19 DIAGNOSIS — E7800 Pure hypercholesterolemia, unspecified: Secondary | ICD-10-CM | POA: Diagnosis not present

## 2018-05-19 MED ORDER — METOPROLOL SUCCINATE ER 50 MG PO TB24: 50.0000 mg | ORAL_TABLET | Freq: Every day | ORAL | 4 refills | Status: DC

## 2018-05-19 NOTE — Progress Notes (Signed)
Cardiology Office Note   Date:  05/19/2018   ID:  Sarah Hogan, DOB 1961/03/31, MRN [id number]  PCP:  Girtha Rm, NP-C  Cardiologist:  Fransico Him, MD EP: None  Chief Complaint  Patient presents with  . Follow-up    SVT      History of Present Illness: Sarah Hogan is a 57 y.o. female with a PMH of PACs/PVCs, SVT, HLD, MS, GERD, Migraines, Bipolar disorder, and former tobacco abuse who presents for routine follow-up of her SVT. She has a history of palpitations and was found to have SVT, PACs, and PVCs on an event monitor in 2018. Her last ischemic evaluation was in 2015 when she underwent a cardiac catheterization which revealed normal coronaries in work-up of +stress test for evaluation of fatigue. Her last echocardiogram 08/2016 showed EF 65-70%, mild AR, and mild MR.   She was last seen by Dr. Radford Pax 04/2017 to establish cardiology care after moving to [facility] from [place]. She had no complaints at that time and no medication changes occurred at that visit.  Today, she presents for routine follow-up. She reports doing well from a cardiac standpoint. Palpitations are well controlled and she has not had any concerning episodes recently. She had a recent viral illness with vertigo and diarrhea last week which has resolved with the exception of a poor appetite. She had been doing water aerobics earlier this year and is hopeful to get back into this this winter. She has some DOE which is unchanged in recent months/years. Denies chest pain or SOB at rest. Her only request today is for a refill of her Toprol-XL.    Past Medical History:  Diagnosis Date  . Bipolar 2 disorder ([place])   . Hyperlipidemia   . Leukopenia   . Mild aortic insufficiency    by echo 07/2016  . Mild mitral regurgitation    by echo 07/2016  . MS (multiple sclerosis) ([place])   . Normal coronary arteries cath 2015  . Overactive bladder   . PVC's (premature ventricular contractions) 04/22/2017  . SVT  (supraventricular tachycardia) ([place]) 04/22/2017  . Vitamin D deficiency     Past Surgical History:  Procedure Laterality Date  . CARPAL TUNNEL RELEASE    . CHOLECYSTECTOMY       Current Outpatient Medications  Medication Sig Dispense Refill  . baclofen (LIORESAL) 10 MG tablet Take 10 mg by mouth 2 (two) times daily.     . busPIRone (BUSPAR) 10 MG tablet Take 10 mg by mouth 2 (two) times daily.    Marland Kitchen ezetimibe (ZETIA) 10 MG tablet Take 10 mg by mouth daily.    . metoprolol succinate (TOPROL XL) 50 MG 24 hr tablet Take 1 tablet (50 mg total) by mouth daily. Take with or immediately following a meal. MUST HAVE BRAND NAME ONLY 90 tablet 4  . modafinil (PROVIGIL) 100 MG tablet Take 50 mg by mouth as needed.     . Omega-3 Fatty Acids (OMEGA 3 PO) Take by mouth daily. Patient said she's taking 1 tablespoon of Omega 3 daily    . OXcarbazepine (TRILEPTAL) 150 MG tablet Take 1 tablet (150 mg total) by mouth 2 (two) times daily. 180 tablet 0  . oxybutynin (DITROPAN-XL) 10 MG 24 hr tablet Take 1 tablet (10 mg total) at bedtime by mouth. 90 tablet 0  . pregabalin (LYRICA) 50 MG capsule Take 50 mg by mouth daily.    Marland Kitchen topiramate (TOPAMAX) 50 MG tablet Take 1 tablet (50 mg total)  by mouth at bedtime. 90 tablet 1  . venlafaxine XR (EFFEXOR-XR) 75 MG 24 hr capsule Take 37.5 mg by mouth daily with breakfast.     . ziprasidone (GEODON) 20 MG capsule Take 20 mg by mouth 2 (two) times daily with a meal.     No current facility-administered medications for this visit.     Allergies:   Amoxicillin    Social History:  The patient  reports that she quit smoking about 23 years ago. Her smoking use included cigarettes. She has a 30.00 pack-year smoking history. She has never used smokeless tobacco. She reports that she drinks alcohol. She reports that she does not use drugs.   Family History:  The patient's family history includes Bipolar disorder in her mother; Breast cancer (age of onset: 58) in her paternal  grandmother; Dementia in her mother; Hyperlipidemia in her brother; Hypertension in her brother; Obesity in her brother; Skin cancer in her paternal grandmother; Stroke (age of onset: 48) in her mother.    ROS:  Please see the history of present illness.   Otherwise, review of systems are positive for none.   All other systems are reviewed and negative.    PHYSICAL EXAM: VS:  BP (!) 90/54   Pulse 60   Ht 5' 5.5" (1.664 m)   Wt 208 lb 1.9 oz (94.4 kg)   LMP 06/28/2008   BMI 34.11 kg/m  , BMI Body mass index is 34.11 kg/m. GEN: Well nourished, well developed, sitting in chair in no acute distress HEENT: sclera anicteric Neck: no JVD, carotid bruits, or masses Cardiac: RRR; no murmurs, rubs, or gallops, no edema  Respiratory:  clear to auscultation bilaterally, normal work of breathing GI: soft, nontender, nondistended, + BS MS: no deformity or atrophy Skin: warm and dry, no rash Neuro:  Strength and sensation are intact Psych: euthymic mood, full affect   EKG:  EKG is ordered today. The ekg ordered today demonstrates sinus rhythm with rate 60 bpm, non-ischemic; no significant change from previous   Recent Labs: 12/10/2017: ALT 16; BUN 14; Creatinine, Ser 1.18; Hemoglobin 12.9; Platelets 187; Potassium 5.2; Sodium 143    Lipid Panel    Component Value Date/Time   CHOL 142 12/10/2017 1445   TRIG 103 12/10/2017 1445   HDL 54 12/10/2017 1445   CHOLHDL 2.6 12/10/2017 1445   CHOLHDL 4.2 02/26/2017 1126   VLDL 38 (H) 02/26/2017 1126   LDLCALC 67 12/10/2017 1445      Wt Readings from Last 3 Encounters:  05/19/18 208 lb 1.9 oz (94.4 kg)  12/10/17 218 lb 3.2 oz (99 kg)  04/28/17 230 lb 9.6 oz (104.6 kg)      Other studies Reviewed: Additional studies/ records that were reviewed today include: None.    ASSESSMENT AND PLAN:   1. SVT: well controlled without significant episodes in recent months - Continue metoprolol XL  2. PVCs: occurs occasionally but are not  bothersome - Continue metoprolol XL   3. Mild MR/AR: noted on last echo 08/2016. Asymptomatic at this time - Continue routine monitoring with an echocardiogram every 3 years or earlier if symptoms develop - due 08/2019  4. HLD: cholesterol followed by PCP. Last LDL improved to 67 11/2017. - Continue pravastatin and zetia   Current medicines are reviewed at length with the patient today.  The patient does not have concerns regarding medicines.  The following changes have been made:  no change  Labs/ tests ordered today include: None  Orders Placed  This Encounter  Procedures  . EKG 12-Lead     Disposition:   FU with Dr. Radford Pax in 1 year.  Signed, Abigail Butts, PA-C  05/19/2018 9:16 AM

## 2018-05-19 NOTE — Patient Instructions (Addendum)
Medication Instructions:   Your physician recommends that you continue on your current medications as directed. Please refer to the Current Medication list given to you today.  If you need a refill on your cardiac medications before your next appointment, please call your pharmacy.   Lab work:  NONE ORDERED  TODAY  If you have labs (blood work) drawn today and your tests are completely normal, you will receive your results only by: Marland Kitchen MyChart Message (if you have MyChart) OR . A paper copy in the mail If you have any lab test that is abnormal or we need to change your treatment, we will call you to review the results.  Testing/Procedures: NONE ORDERED  TODAY   Follow-Up: At  [facility], you and your health needs are our priority.  As part of our continuing mission to provide you with exceptional heart care, we have created designated Provider Care Teams.  These Care Teams include your primary Cardiologist (physician) and Advanced Practice Providers (APPs -  Physician Assistants and Nurse Practitioners) who all work together to provide you with the care you need, when you need it. You will need a follow up appointment in 1 years.  Please call our office 2 months in advance to schedule this appointment.  You may see Fransico Him, MD or one of the following Advanced Practice Providers on your designated Care Team:   [place], PA-C Melina Copa, PA-C . Ermalinda Barrios, PA-C  Any Other Special Instructions Will Be Listed Below (If Applicable).

## 2018-05-25 DIAGNOSIS — N319 Neuromuscular dysfunction of bladder, unspecified: Secondary | ICD-10-CM | POA: Diagnosis not present

## 2018-05-25 DIAGNOSIS — G35 Multiple sclerosis: Secondary | ICD-10-CM | POA: Diagnosis not present

## 2018-05-25 DIAGNOSIS — N952 Postmenopausal atrophic vaginitis: Secondary | ICD-10-CM | POA: Diagnosis not present

## 2018-05-25 DIAGNOSIS — R8290 Unspecified abnormal findings in urine: Secondary | ICD-10-CM | POA: Diagnosis not present

## 2018-05-25 DIAGNOSIS — N3941 Urge incontinence: Secondary | ICD-10-CM | POA: Diagnosis not present

## 2018-06-02 ENCOUNTER — Ambulatory Visit: Payer: Medicare PPO | Admitting: Psychiatry

## 2018-06-02 ENCOUNTER — Other Ambulatory Visit: Payer: Self-pay

## 2018-06-02 ENCOUNTER — Encounter: Payer: Self-pay | Admitting: Psychiatry

## 2018-06-02 VITALS — BP 106/74 | HR 84 | Ht 66.0 in | Wt 208.0 lb

## 2018-06-02 DIAGNOSIS — G4720 Circadian rhythm sleep disorder, unspecified type: Secondary | ICD-10-CM | POA: Diagnosis not present

## 2018-06-02 DIAGNOSIS — F3181 Bipolar II disorder: Secondary | ICD-10-CM | POA: Diagnosis not present

## 2018-06-02 DIAGNOSIS — F411 Generalized anxiety disorder: Secondary | ICD-10-CM | POA: Diagnosis not present

## 2018-06-02 DIAGNOSIS — D8989 Other specified disorders involving the immune mechanism, not elsewhere classified: Secondary | ICD-10-CM | POA: Diagnosis not present

## 2018-06-02 DIAGNOSIS — R5382 Chronic fatigue, unspecified: Secondary | ICD-10-CM

## 2018-06-02 DIAGNOSIS — G9332 Myalgic encephalomyelitis/chronic fatigue syndrome: Secondary | ICD-10-CM

## 2018-06-02 DIAGNOSIS — F3130 Bipolar disorder, current episode depressed, mild or moderate severity, unspecified: Secondary | ICD-10-CM

## 2018-06-02 MED ORDER — METOPROLOL SUCCINATE ER 50 MG PO TB24: 50.0000 mg | ORAL_TABLET | Freq: Every day | ORAL | 3 refills | Status: DC

## 2018-06-02 NOTE — Progress Notes (Signed)
[facility]  Patient ID: Sarah Hogan,  MRN: [id number]  PCP: Girtha Rm, NP-C  Date of Evaluation: 06/02/2018 Time spent:20 minutes  Chief Complaint:  Chief Complaint    Anxiety; Depression      HISTORY/CURRENT STATUS: Sarah Hogan is seen individually face-to-face with consent not collateral for psychiatric interview and exam in 59-month evaluation and management of bipolar depressed, generalized anxiety, chronic fatigue syndrome associated with immune dysfunction, and circadian rhythm sleep disorder.She is doing well having terminated therapy with Dr. Marlowe Sax remaining active in her [facility] as well as with family and friends.  [place] friends who were staying in her home for 6 weeks have moved to their own home in [place], and patient is expecting brother from [place] for Thanksgiving. She is better in mood, anxiety, and cognitive function.  She is much more effective expressing self-confidence and independence, self control and regulation of emotion, and full range of cognitive and memory processing.  She distinguishes that she has not taken Tranxene 3.75 mg in years due to benzodiazepines being relatively contraindicated in dementia, though she experienced it as an optimal antianxiety.  She does take trazodone as one-half of a 50 mg tablet a couple of times weekly as needed for insomnia without adverse effect.  Otherwise her medications are helpful without adverse effects, and she is pleased with medications with weight down another 2 pounds now having a second dog she took over from a man who died but feeling her cat is getting squeezed out of the house.    Anxiety  Presents for follow-up visit. Symptoms include confusion, decreased concentration, depressed mood, excessive worry, nervous/anxious behavior and restlessness. Patient reports no chest pain, compulsions, dizziness, dry mouth, feeling of choking, hyperventilation, impotence, insomnia,  irritability, malaise, muscle tension, nausea, obsessions, palpitations, panic, shortness of breath or suicidal ideas. Symptoms occur most days. The most recent episode lasted 5 hours. The severity of symptoms is mild. The patient sleeps 5 hours per night. The quality of sleep is fair. Nighttime awakenings: one to two.   Compliance with medications is 51-75%. Side effects of treatment include headaches.  Depression         Associated symptoms include decreased concentration and restlessness.  Associated symptoms include does not have insomnia and no suicidal ideas.  Past medical history includes anxiety.     Individual Medical History/ Review of Systems: Changes? :Yes .  In the interim, MRI per patient was said to be okay but to have black holes from Alzheimer's changes surrounding MS.  Her uro-gynecologist has helped greatly with bladder medication for her neurogenic bladder, so she now wears underclothing rather than depends.  However the uro-gynecologist questioned whether she has MS with the pattern of symptoms relative to bladder and spinal muscle derivations.  The patient reviews all of her history since 1990 of exacerbations and remissions as miracles or answers to prayers such as 1996 but then relapsed again in 2003.  She will start natural estrogens soon likely.  Allergies: Amoxicillin  Current Medications:  Current Outpatient Medications:  .  venlafaxine XR (EFFEXOR-XR) 37.5 MG 24 hr capsule, Take 37.5 mg by mouth daily with breakfast., Disp: , Rfl:  .  ziprasidone (GEODON) 20 MG capsule, Take 20 mg by mouth at bedtime., Disp: , Rfl:  .  baclofen (LIORESAL) 10 MG tablet, Take 10 mg by mouth 2 (two) times daily. , Disp: , Rfl:  .  busPIRone (BUSPAR) 10 MG tablet, Take 10 mg by mouth 2 (two) times daily., Disp: ,  Rfl:  .  ezetimibe (ZETIA) 10 MG tablet, Take 10 mg by mouth daily., Disp: , Rfl:  .  metoprolol succinate (TOPROL XL) 50 MG 24 hr tablet, Take 1 tablet (50 mg total) by mouth daily.  Take with or immediately following a meal. MUST HAVE BRAND NAME ONLY, Disp: 90 tablet, Rfl: 3 .  modafinil (PROVIGIL) 100 MG tablet, Take 50 mg by mouth as needed. , Disp: , Rfl:  .  Omega-3 Fatty Acids (OMEGA 3 PO), Take by mouth daily. Patient said she's taking 1 tablespoon of Omega 3 daily, Disp: , Rfl:  .  OXcarbazepine (TRILEPTAL) 150 MG tablet, Take 1 tablet (150 mg total) by mouth 2 (two) times daily., Disp: 180 tablet, Rfl: 0 .  oxybutynin (DITROPAN-XL) 10 MG 24 hr tablet, Take 1 tablet (10 mg total) at bedtime by mouth., Disp: 90 tablet, Rfl: 0 .  pregabalin (LYRICA) 50 MG capsule, Take 50 mg by mouth daily., Disp: , Rfl:  .  topiramate (TOPAMAX) 50 MG tablet, Take 1 tablet (50 mg total) by mouth at bedtime., Disp: 90 tablet, Rfl: 1 Medication Side Effects: none  Family Medical/ Social History: Changes? Yes .  Mother had bipolar disorder, lupus, and dementia may have died in 2009-11-02.  Father had alcohol use disorder.  MENTAL HEALTH EXAM: Muscle strength 4+/4+, postural reflexes -2/-2, and AIMS = 0 Blood pressure 106/74, pulse 84, height 5\' 6"  (1.676 m), weight 208 lb (94.3 kg), last menstrual period 06/28/2008.Body mass index is 33.57 kg/m.  General Appearance: Casual, Guarded and Meticulous and less obese  Eye Contact:  Good  Speech:  Clear and Coherent and Talkative  Volume:  Normal  Mood:  Anxious, Dysphoric, Euphoric and Worthless  Affect:  Congruent, Labile and Anxious  Thought Process:  Goal Directed  Orientation:  Full (Time, Place, and Person)  Thought Content: Logical   Suicidal Thoughts:  No  Homicidal Thoughts:  No  Memory:  Recent;   Fair Remote;   Good  Judgement:  Fair  Insight:  Fair  Psychomotor Activity:  Normal and Mannerisms  Concentration:  Concentration: Fair and Attention Span: Fair  Recall:  Good to fair  [employer] of Knowledge: Fair  Language: Fair  Assets:  Communication Skills Desire for Improvement Resilience  ADL's:  Intact  Cognition: WNL   Prognosis:  Fair    DIAGNOSES:    ICD-10-CM   1. Bipolar II disorder, moderate, depressed, with anxious distress, in partial remission ([place]) F31.81   2. Circadian rhythm sleep disorder, unspecified type G47.20   3. Generalized anxiety disorder F41.1   4. CFIDS (chronic fatigue and immune dysfunction syndrome) ([facility]) R53.82    D89.89     Receiving Psychotherapy: No .  She terminated therapy with Dr. Marlowe Sax as no-show charges accumulated when she could not be in receipt of social contacts from the office as reminders of her upcoming appointment.    RECOMMENDATIONS: Nyesha is both modestly anxious in her defensiveness about problems while at the same time being somewhat exhibitionistic with the problems.She continues Provigil helping circadian rhythm and chronic fatigue symptoms without any current side effects from any of her treatments.  She considers Geodon most important to her recovery and the low-dose Effexor next so for bipolar and GAD.  She is not taking Tranxene but does take trazodone one half of a 50 mg nightly as needed for sleep.  She continues Topamax 50 mg nightly, BuSpar 10 mg twice daily, venlafaxine 37.5 mg XR every morning, Geodon 20 mg nightly,  modafinil 100 mg every morning and Trileptal 150 mg twice daily with trazodone 50 mg as 1/2 tablet nightly as needed for sleep and treatment of her bipolar, GAD, circadian rhythm sleep disorder, and chronic fatigue with immune dysfunction.  She returns in 2 months.    Delight Hoh, MD

## 2018-06-15 ENCOUNTER — Other Ambulatory Visit: Payer: Self-pay

## 2018-06-15 DIAGNOSIS — F3130 Bipolar disorder, current episode depressed, mild or moderate severity, unspecified: Secondary | ICD-10-CM

## 2018-06-15 MED ORDER — ZIPRASIDONE HCL 20 MG PO CAPS: 20.0000 mg | ORAL_CAPSULE | Freq: Every day | ORAL | 0 refills | Status: DC

## 2018-06-15 MED ORDER — ZIPRASIDONE HCL 20 MG PO CAPS: 20.0000 mg | ORAL_CAPSULE | Freq: Every day | ORAL | 1 refills | Status: DC

## 2018-06-15 NOTE — Telephone Encounter (Signed)
Patient phones that she is down to 1 dose of Geodon remaining, so sent today to pill pack by [employer] 20 mg nightly #90 with no refill noting today as well as last appointments that she is doing well on it for bipolar disorder.

## 2018-06-16 ENCOUNTER — Encounter: Payer: Medicare PPO | Admitting: Family Medicine

## 2018-06-16 ENCOUNTER — Encounter: Payer: Self-pay | Admitting: Family Medicine

## 2018-06-16 NOTE — Progress Notes (Signed)
   Subjective:    Patient ID: Sarah Hogan, female    DOB: [birth year], 57 y.o.   MRN: [id number]  HPI Chief Complaint  Patient presents with  . follow-up on med    follow-up of med.- off of oxybutynin   Patient has established with a urogynecologist at [facility] and no longer needs me to refill her medication for urinary incontinence. She is unsure why she needs to see me today. States all of her medical care is being managed by specialists.   No concerns today.    Review of Systems     Objective:   Physical Exam BP 104/62   Pulse 61   Wt 210 lb 3.2 oz (95.3 kg)   LMP 06/28/2008   BMI 33.93 kg/m         Assessment & Plan:  ERRONEOUS ENCOUNTER--DISREGARD  Follow up PRN.

## 2018-06-17 ENCOUNTER — Telehealth: Payer: Self-pay | Admitting: Cardiology

## 2018-06-17 MED ORDER — METOPROLOL TARTRATE 50 MG PO TABS: 25.0000 mg | ORAL_TABLET | Freq: Two times a day (BID) | ORAL | 3 refills | Status: DC

## 2018-06-17 NOTE — Telephone Encounter (Signed)
Spoke with the patient, she accepted the change and will let our office know if this medication is too expensive.

## 2018-06-17 NOTE — Telephone Encounter (Signed)
Change to Lopressor 25mg  BID

## 2018-06-17 NOTE — Telephone Encounter (Signed)
  Pt c/o medication issue:  1. Name of Medication: metoprolol succinate (TOPROL XL) 50 MG 24 hr tablet  2. How are you currently taking this medication (dosage and times per day)? Yes, Take 1 tablet (50 mg total) by mouth daily. Take with or immediately following a meal. MUST HAVE BRAND NAME ONLY  3. Are you having a reaction (difficulty breathing--STAT)?  No  4. What is your medication issue? Patient has to get medications at [facility] now and this medication is $200. Patient is on disability and would like to know if she can take another medication that is cheaper or get help with the cost.

## 2018-06-22 ENCOUNTER — Ambulatory Visit: Payer: Medicare PPO | Admitting: Family Medicine

## 2018-06-22 ENCOUNTER — Encounter: Payer: Self-pay | Admitting: Family Medicine

## 2018-06-22 VITALS — BP 132/84 | HR 68 | Temp 98.0°F | Resp 16 | Wt 208.6 lb

## 2018-06-22 DIAGNOSIS — R110 Nausea: Secondary | ICD-10-CM | POA: Diagnosis not present

## 2018-06-22 DIAGNOSIS — D3132 Benign neoplasm of left choroid: Secondary | ICD-10-CM | POA: Diagnosis not present

## 2018-06-22 DIAGNOSIS — G35 Multiple sclerosis: Secondary | ICD-10-CM | POA: Diagnosis not present

## 2018-06-22 DIAGNOSIS — G43909 Migraine, unspecified, not intractable, without status migrainosus: Secondary | ICD-10-CM | POA: Diagnosis not present

## 2018-06-22 DIAGNOSIS — H539 Unspecified visual disturbance: Secondary | ICD-10-CM | POA: Diagnosis not present

## 2018-06-22 DIAGNOSIS — G43119 Migraine with aura, intractable, without status migrainosus: Secondary | ICD-10-CM | POA: Diagnosis not present

## 2018-06-22 DIAGNOSIS — H2513 Age-related nuclear cataract, bilateral: Secondary | ICD-10-CM | POA: Diagnosis not present

## 2018-06-22 MED ORDER — ONDANSETRON 4 MG PO TBDP: 4.0000 mg | ORAL_TABLET | Freq: Three times a day (TID) | ORAL | 0 refills | Status: DC | PRN

## 2018-06-22 MED ORDER — KETOROLAC TROMETHAMINE 60 MG/2ML IM SOLN
60.0000 mg | Freq: Once | INTRAMUSCULAR | Status: AC
  Administered 2018-06-22: 60 mg via INTRAMUSCULAR

## 2018-06-22 NOTE — Patient Instructions (Signed)
Here is a list of Eye Doctors that you call and schedule an appointment with.   [facility] Address: [place], [place], [place] [address] Phone: [phone]   [facility] Address: [address] [place], [place], [place] [address] Phone: [phone]  Dr. Katy Fitch Address: [address] [place], [place] [address] Phone: [phone]  [facility] [address] [place],   [address] Telephone: [phone]

## 2018-06-22 NOTE — Progress Notes (Signed)
Subjective:    Patient ID: Sarah Hogan, female    DOB: 07/11/1961, 57 y.o.   MRN: [id number]  HPI Chief Complaint  Patient presents with  . headache    headache- friday afternoon. its not as bad today. no blurred vision, got an aura friday evening,  light sentsitivity , nasuea   She is a 57 year old female with a history of MS and migraine headaches who is here with complaints of a migraine headache that she describes as her typical migraine except for a new onset of right eye visual disturbance. Denies dizziness, numbness, tingling or weakness.   Headache is generalized and "sore". Pain described initially as ache prior to today.   Right eye with intermittent flashing light on the right side only. Still present even when closing her eye.  This started Friday night and changed from a wavy lines to a flickering light on the right.   States headache is at least 50% improved since Friday night but the vision is worse.  She reports having some mild nausea. Tinnitus is present but this is not new.   States she took Excedrin migraine Saturday but nothing since.   Last migraine headache was 2 months ago. Is current on a daily preventive medication Topamax.   Neurologist is in [place].  She did not contact them regarding new visual disturbance.  Denies chest pain, palpitations, shortness of breath, abdominal pain, vomiting, diarrhea, urinary symptoms.   Reviewed allergies, medications, past medical, surgical, family, and social history.   Review of Systems Pertinent positives and negatives in the history of present illness.     Objective:   Physical Exam  Constitutional: She is oriented to person, place, and time. She appears well-developed and well-nourished. She does not have a sickly appearance. No distress.  HENT:  Right Ear: Hearing, tympanic membrane and ear canal normal.  Left Ear: Hearing, tympanic membrane and ear canal normal.  Nose: Nose normal.  Mouth/Throat:  Uvula is midline, oropharynx is clear and moist and mucous membranes are normal.  Eyes: Pupils are equal, round, and reactive to light. Conjunctivae, EOM and lids are normal.  Neck: Trachea normal, full passive range of motion without pain and phonation normal. Neck supple. Carotid bruit is not present. No thyromegaly present.  Cardiovascular: Normal rate, regular rhythm, normal heart sounds and intact distal pulses.  No LE edema  Pulmonary/Chest: Effort normal and breath sounds normal.  Abdominal: Normal appearance.  Musculoskeletal: Normal range of motion.  Lymphadenopathy:    She has no cervical adenopathy.  Neurological: She is alert and oriented to person, place, and time. She has normal strength and normal reflexes. No cranial nerve deficit or sensory deficit. She displays a negative Romberg sign. Coordination and gait normal.  No facial asymmetry or pronator drift. Normal finger to nose and heel to shin.  Peripheral vision field test normal.   Skin: Skin is warm and dry. Capillary refill takes less than 2 seconds. No rash noted.  Psychiatric: She has a normal mood and affect. Her speech is normal and behavior is normal. Cognition and memory are normal.   BP 132/84   Pulse 68   Temp 98 F (36.7 C) (Oral)   Resp 16   Wt 208 lb 9.6 oz (94.6 kg)   LMP 06/28/2008   SpO2 98%   BMI 33.67 kg/m      Assessment & Plan:  Intractable migraine with aura without status migrainosus - Plan: ketorolac (TORADOL) injection 60 mg  MS (multiple  sclerosis) (HCC)  Nausea without vomiting - Plan: ondansetron (ZOFRAN ODT) 4 MG disintegrating tablet  Visual disturbance of one eye  She is not in any distress.  Neurological exam unremarkable. Migraine is described as her usual migraine except for the new right eye visual disturbance. She has been taking her daily Topamax and tried Excedrin on day 1 of her migraine but nothing for the past 2 days. We will give her Toradol 60 mg IM injection to see  if this improves her pain.  I will also prescribe Zofran for nausea. Discussed that if the visual disturbance resolves with the migraine treatment then this is most likely related to migraines.  Discussed that if the visual disturbance does not improve with headache resolution that this could be related to her MS. Advised her to call and schedule an appointment with her neurologist. Reviewed her recent MRI results from October which did show extensive white matter disease suggesting MS. Strict precautions that if she has any strokelike symptoms or worsening visual changes that she should go to the emergency department.  She will also call and schedule with an ophthalmologist since she has not established with one since moving to the area.

## 2018-07-06 DIAGNOSIS — N3941 Urge incontinence: Secondary | ICD-10-CM | POA: Diagnosis not present

## 2018-07-06 DIAGNOSIS — R399 Unspecified symptoms and signs involving the genitourinary system: Secondary | ICD-10-CM | POA: Diagnosis not present

## 2018-07-06 DIAGNOSIS — G35 Multiple sclerosis: Secondary | ICD-10-CM | POA: Diagnosis not present

## 2018-07-06 DIAGNOSIS — N952 Postmenopausal atrophic vaginitis: Secondary | ICD-10-CM | POA: Diagnosis not present

## 2018-07-06 DIAGNOSIS — N949 Unspecified condition associated with female genital organs and menstrual cycle: Secondary | ICD-10-CM | POA: Diagnosis not present

## 2018-07-17 ENCOUNTER — Encounter: Payer: Self-pay | Admitting: Emergency Medicine

## 2018-07-27 ENCOUNTER — Telehealth: Payer: Self-pay | Admitting: Cardiology

## 2018-07-27 NOTE — Telephone Encounter (Signed)
PCP needs to refill this Rx

## 2018-07-27 NOTE — Telephone Encounter (Signed)
Sarah Hogan is requesting a refill on ezetimide 10 mg tablet. Dr. Radford Pax did not prescribe this medication. Would Dr. Radford Pax like to refill this medication? Please address

## 2018-07-28 ENCOUNTER — Telehealth: Payer: Self-pay

## 2018-07-28 NOTE — Telephone Encounter (Signed)
Spoke with the patient, she expressed understanding and had no further questions.

## 2018-07-28 NOTE — Telephone Encounter (Signed)
Spoke with the patient, she was receiving Zetia from cardiologist in [place] and now she has transferred to [place].

## 2018-07-28 NOTE — Telephone Encounter (Signed)
Patient was prescribed Zetia from her Cardiologist in [place] however her cardiologist has informed her that she needs to get her refills from her new pcp. Please advise.

## 2018-07-28 NOTE — Telephone Encounter (Signed)
I stated in my note the last time that I do not treat her lipids her new PCP does and she will need to get it  From them since they are checking her lipids

## 2018-07-29 ENCOUNTER — Other Ambulatory Visit: Payer: Self-pay | Admitting: Family Medicine

## 2018-07-29 MED ORDER — EZETIMIBE 10 MG PO TABS: 10.0000 mg | ORAL_TABLET | Freq: Every day | ORAL | 0 refills | Status: DC

## 2018-07-29 NOTE — Telephone Encounter (Signed)
Pt has been on this med for years but her doctor would not refill this anymore in [place]. I will send in rx until her appt

## 2018-07-29 NOTE — Telephone Encounter (Signed)
Looks like her cholesterol was last checked 7 months ago. Not sure how long she has been on the Zetia, we did not discuss this. Ask her to come in for a visit please.

## 2018-08-03 ENCOUNTER — Ambulatory Visit: Payer: Medicare PPO | Admitting: Psychiatry

## 2018-08-03 ENCOUNTER — Encounter: Payer: Self-pay | Admitting: Psychiatry

## 2018-08-03 VITALS — BP 118/82 | HR 72 | Ht 65.5 in | Wt 202.0 lb

## 2018-08-03 DIAGNOSIS — F411 Generalized anxiety disorder: Secondary | ICD-10-CM | POA: Diagnosis not present

## 2018-08-03 DIAGNOSIS — F3181 Bipolar II disorder: Secondary | ICD-10-CM

## 2018-08-03 DIAGNOSIS — G4720 Circadian rhythm sleep disorder, unspecified type: Secondary | ICD-10-CM | POA: Diagnosis not present

## 2018-08-03 NOTE — Progress Notes (Signed)
[facility]  Patient ID: Sarah Hogan,  MRN: [id number]  PCP: Sarah Rm, NP-C  Date of Evaluation: 08/03/2018 Time spent:20 minutes  Chief Complaint:   HISTORY/CURRENT STATUS: Sarah Hogan is seen individually face-to-face with consent not collateral for psychiatric interview and exam in 80-month evaluation and management of bipolar depression associated with MS, GAD, and Circadian sleep disorder describing much distress but attempting to maintain in the interim.  She is preparing to start bioidentical estrogen for prevention of Alzheimer's considering her extensive black holes on her MRI of the brain from [facility].  Her hyperlipidemia medication is declined by cardiology so that she will seek that from primary care as she still obtains refills from cardiologist in [place].  She notes the need for completion of disability paperwork as by psychiatrist in [place].  She has ophthalmology care for migraines with flashes but not scotomata of floaters with incidental nevus apparently of the retina being monitored but no resolution.  Migraine symptoms are new unlike those of the past.  She does not have chronic fatigue in follow-up of last appointment.  She reports migraines were barometric in the past and triggered by rainy weather.  She has been provided an anti-medic as she misses two thirds of her medications when migraine nausea is underway, though club soda helps somewhat.  Certainly the absence from her usual medications might trigger additional headaches.  She continues Topamax 50 mg nightly for migraine prevention and will see her neurologist in April in this regard as well as her MS.  Weight is down 8 pounds total in 3 months.  Prednisone from neurology did not help the current headaches.  Depression       The patient presents with depression.  This is a recurrent problem.  The current episode started more than 1 year ago.   The onset quality is sudden.   The problem occurs intermittently.   The most recent episode lasted 6 weeks.    Associated symptoms include decreased concentration, fatigue, hopelessness, decreased interest, appetite change, headaches and sad.  Associated symptoms include does not have insomnia and no suicidal ideas.     The symptoms are aggravated by medication, social issues and family issues.  Past treatments include SSRIs - Selective serotonin reuptake inhibitors, SNRIs - Serotonin and norepinephrine reuptake inhibitors, TCAs - Tricyclic antidepressants, psychotherapy and other medications.  Compliance with treatment is good.  Past compliance problems include difficulty understanding directions, medical issues, medication issues, difficulty with treatment plan, pharmacy issues and insurance issues.  Previous treatment provided mild relief.  Risk factors include the patient not taking medications correctly, stress, menopause, major life event, history of mental illness, family history of mental illness and family history.   Past medical history includes chronic illness, recent illness, physical disability, dementia, anxiety, bipolar disorder, depression and mental health disorder.     Pertinent negatives include no eating disorder, no obsessive-compulsive disorder, no post-traumatic stress disorder, no schizophrenia, no suicide attempts and no head trauma.   Individual Medical History/ Review of Systems: Changes? :No   Allergies: Amoxicillin  Current Medications:  Current Outpatient Medications:  .  baclofen (LIORESAL) 10 MG tablet, Take 10 mg by mouth 2 (two) times daily. , Disp: , Rfl:  .  busPIRone (BUSPAR) 10 MG tablet, Take 10 mg by mouth 2 (two) times daily., Disp: , Rfl:  .  ezetimibe (ZETIA) 10 MG tablet, Take 1 tablet (10 mg total) by mouth daily., Disp: 90 tablet, Rfl: 0 .  metoprolol tartrate (LOPRESSOR) 50 MG  tablet, Take 0.5 tablets (25 mg total) by mouth 2 (two) times daily., Disp: 90 tablet, Rfl: 3 .  modafinil (PROVIGIL) 100 MG tablet, Take 50 mg by  mouth as needed. , Disp: , Rfl:  .  MYRBETRIQ 25 MG TB24 tablet, Take 25 mg by mouth daily., Disp: , Rfl: 3 .  Omega-3 Fatty Acids (OMEGA 3 PO), Take by mouth daily. Patient said she's taking 1 tablespoon of Omega 3 daily, Disp: , Rfl:  .  ondansetron (ZOFRAN ODT) 4 MG disintegrating tablet, Take 1 tablet (4 mg total) by mouth every 8 (eight) hours as needed for nausea or vomiting., Disp: 20 tablet, Rfl: 0 .  OXcarbazepine (TRILEPTAL) 150 MG tablet, Take 1 tablet (150 mg total) by mouth 2 (two) times daily., Disp: 180 tablet, Rfl: 0 .  pregabalin (LYRICA) 50 MG capsule, Take 50 mg by mouth daily., Disp: , Rfl:  .  PREMARIN vaginal cream, PLACE A PEA SIZED AMOUNT IN THE VAGINA NIGHTLY FOR 3 WEEKS THEN USE EVERY OTHER NIGHT, Disp: , Rfl: 5 .  topiramate (TOPAMAX) 50 MG tablet, Take 1 tablet (50 mg total) by mouth at bedtime., Disp: 90 tablet, Rfl: 1 .  venlafaxine XR (EFFEXOR-XR) 37.5 MG 24 hr capsule, Take 37.5 mg by mouth daily with breakfast., Disp: , Rfl:  .  venlafaxine XR (EFFEXOR-XR) 37.5 MG 24 hr capsule, Take 37.5 mg by mouth daily with breakfast., Disp: , Rfl:  .  ziprasidone (GEODON) 20 MG capsule, Take 1 capsule (20 mg total) by mouth at bedtime., Disp: 90 capsule, Rfl: 1 Medication Side Effects: GI irritation, headache and nausea  Family Medical/ Social History: Changes? Yes report of attempting to strengthen her spiritual life is not a suicide equivalent but a genuine pursuit.  MENTAL HEALTH EXAM: Muscle strength 4/4, postural reflexes -2/-2 and AIMS equals 0. Blood pressure 118/82, pulse 72, height 5' 5.5" (1.664 m), weight 202 lb (91.6 kg), last menstrual period 06/28/2008.Body mass index is 33.1 kg/m.  General Appearance: Disheveled, Fairly Groomed and Guarded  Eye Contact:  Fair  Speech:  Garbled and Normal Rate  Volume:  Normal  Mood:  Anxious, Depressed, Dysphoric, Hopeless and Worthless  Affect:  Depressed, Labile, Restricted and Anxious  Thought Process:  Disorganized and  Goal Directed  Orientation:  Full (Time, Place, and Person)  Thought Content: Obsessions and Rumination   Suicidal Thoughts:  No  Homicidal Thoughts:  No  Memory:  Immediate;   Fair Remote;   Fair  Judgement:  Fair  Insight:  Fair and Lacking  Psychomotor Activity:  Decreased  Concentration:  Concentration: Poor and Attention Span: Poor  Recall:  [employer] of Knowledge: Fair  Language: Good  Assets:  Desire for Improvement Social Support Talents/Skills  ADL's:  Intact  Cognition: Impaired,  Mild  Prognosis:  Poor    DIAGNOSES:    ICD-10-CM   1. Bipolar II disorder, moderate, depressed, with anxious distress, in partial remission ([place]) F31.81   2. Generalized anxiety disorder F41.1   3. Circadian rhythm sleep disorder, unspecified type G47.20     Receiving Psychotherapy: No closed therapy with Royetta Crochet, PhD   RECOMMENDATIONS: Estrogens and prednisone continue along with her lipid-lowering medication from other providers.  Her Topamax is currently prescribed here as 50 mg nightly for migraine.  She continues ziprasidone 20 mg nightly, venlafaxine 37.5 mg ER every morning, and Trileptal 150 mg twice daily for bipolar and GAD.  She also continues BuSpar twice daily for anxiety and focus she  has Provigil 100 mg every morning for alertness and she of application.  He has Tranxene 3.75 mg daily if needed for anxiety.  His current supply of medications which are prescribed on a yearly basis with pill pack and [employer] turn in 2 months.  Disability work is to be completed.   Delight Hoh, MD

## 2018-08-12 ENCOUNTER — Encounter: Payer: Self-pay | Admitting: Family Medicine

## 2018-08-12 ENCOUNTER — Ambulatory Visit: Payer: Medicare PPO | Admitting: Family Medicine

## 2018-08-12 VITALS — BP 120/64 | HR 61 | Wt 204.4 lb

## 2018-08-12 DIAGNOSIS — E559 Vitamin D deficiency, unspecified: Secondary | ICD-10-CM

## 2018-08-12 DIAGNOSIS — G43109 Migraine with aura, not intractable, without status migrainosus: Secondary | ICD-10-CM | POA: Diagnosis not present

## 2018-08-12 DIAGNOSIS — H539 Unspecified visual disturbance: Secondary | ICD-10-CM

## 2018-08-12 DIAGNOSIS — E7800 Pure hypercholesterolemia, unspecified: Secondary | ICD-10-CM

## 2018-08-12 NOTE — Progress Notes (Signed)
   Subjective:    Patient ID: Sarah Hogan, female    DOB: 07-Dec-1960, 58 y.o.   MRN: [id number]  HPI Chief Complaint  Patient presents with  . follow-up    follow-up on cholesterol. had cholesterol done back in october with Dr. Coralee Pesa. having migraines- 4 big migraines since thanksgiving   She is here to follow up on hyperlipidemia. She has labs done at another facility in October and does not want labs done today. She is taking Zetia and omega 3 fatty acids. Intolerant to statins per patient.  Normal HDL and triglycerides. LDL mildly elevated at 113.   Reports having migraine headaches and visual disturbances unchanged since November. Will see her neurologist for this. Has MS.  Taking Topamax for prevention. Excedrin migraine is not helping.  She thinks the barometric pressure is affecting her migraines.   Still has flashing in her right eye, not as often. No vision loss.  Saw her ophthamologist Dr. Katy Fitch on 06/22/2018.  She took a course of steroids in early January. States this did nothing for the flashing lights.   Denies fever, chills, dizziness, chest pain, palpitations, shortness of breath, abdominal pain, N/V/D, urinary symptoms, LE edema.    Normal TSH 2.060, Free T4 1.10 Vitamin D 35.2.  Homocysteine 9.4  Dr[facility] Creig Hines at [facility] is her psychiatrist.   Reviewed allergies, medications, past medical, surgical, family, and social history.    Review of Systems Pertinent positives and negatives in the history of present illness.     Objective:   Physical Exam BP 120/64   Pulse 61   Wt 204 lb 6.4 oz (92.7 kg)   LMP 06/28/2008   BMI 33.50 kg/m   Alert and oriented and in no distress. Not otherwise examined.       Assessment & Plan:  Pure hypercholesterolemia  Vitamin D deficiency  Migraine with aura and without status migrainosus, not intractable  Visual disturbance of one eye  Continue on Zetia and Omega 3 fatty acid. Cholesterol is not at goal but  pretty well controlled. She reports history of intolerance to statins.  Vitamin D is normal.  She will see her neurologist for MS, migraines and visual disturbance. No red flags. She has been evaluated by eye doctor for this.  Follow up for AWV.

## 2018-08-13 ENCOUNTER — Encounter: Payer: Self-pay | Admitting: Family Medicine

## 2018-08-26 ENCOUNTER — Other Ambulatory Visit: Payer: Self-pay | Admitting: Family Medicine

## 2018-08-26 ENCOUNTER — Other Ambulatory Visit: Payer: Self-pay | Admitting: Psychiatry

## 2018-08-26 DIAGNOSIS — Z1231 Encounter for screening mammogram for malignant neoplasm of breast: Secondary | ICD-10-CM

## 2018-09-21 DIAGNOSIS — H2513 Age-related nuclear cataract, bilateral: Secondary | ICD-10-CM | POA: Diagnosis not present

## 2018-09-21 DIAGNOSIS — H43811 Vitreous degeneration, right eye: Secondary | ICD-10-CM | POA: Diagnosis not present

## 2018-09-21 DIAGNOSIS — D3132 Benign neoplasm of left choroid: Secondary | ICD-10-CM | POA: Diagnosis not present

## 2018-09-25 ENCOUNTER — Ambulatory Visit
Admission: RE | Admit: 2018-09-25 | Discharge: 2018-09-25 | Disposition: A | Discharge status: Home / Self Care | Payer: Medicare PPO | Source: Ambulatory Visit | Attending: Family Medicine | Admitting: Family Medicine

## 2018-09-25 DIAGNOSIS — Z1231 Encounter for screening mammogram for malignant neoplasm of breast: Secondary | ICD-10-CM

## 2018-09-29 ENCOUNTER — Other Ambulatory Visit: Payer: Self-pay

## 2018-09-29 DIAGNOSIS — F3130 Bipolar disorder, current episode depressed, mild or moderate severity, unspecified: Secondary | ICD-10-CM

## 2018-09-29 MED ORDER — OXCARBAZEPINE 150 MG PO TABS: 150.0000 mg | ORAL_TABLET | Freq: Two times a day (BID) | ORAL | 0 refills | Status: DC

## 2018-09-29 MED ORDER — VENLAFAXINE HCL ER 37.5 MG PO CP24: 37.5000 mg | ORAL_CAPSULE | Freq: Every day | ORAL | 0 refills | Status: DC

## 2018-09-29 MED ORDER — ZIPRASIDONE HCL 20 MG PO CAPS: 20.0000 mg | ORAL_CAPSULE | Freq: Every day | ORAL | 0 refills | Status: DC

## 2018-09-29 MED ORDER — TOPIRAMATE 50 MG PO TABS: 50.0000 mg | ORAL_TABLET | Freq: Every day | ORAL | 0 refills | Status: DC

## 2018-10-02 ENCOUNTER — Telehealth: Payer: Self-pay

## 2018-10-02 MED ORDER — EZETIMIBE 10 MG PO TABS: 10.0000 mg | ORAL_TABLET | Freq: Every day | ORAL | 0 refills | Status: DC

## 2018-10-02 NOTE — Telephone Encounter (Signed)
[facility] called and stated that patient would like to have Zetia 90 day supply sent to them. Please advise change.

## 2018-10-02 NOTE — Telephone Encounter (Signed)
Sent med to pharmacy  

## 2018-10-05 ENCOUNTER — Other Ambulatory Visit: Payer: Self-pay

## 2018-10-05 ENCOUNTER — Encounter: Payer: Self-pay | Admitting: Psychiatry

## 2018-10-05 ENCOUNTER — Ambulatory Visit: Payer: Medicare PPO | Admitting: Psychiatry

## 2018-10-05 VITALS — BP 128/86 | HR 84 | Ht 65.5 in | Wt 201.0 lb

## 2018-10-05 DIAGNOSIS — F411 Generalized anxiety disorder: Secondary | ICD-10-CM | POA: Diagnosis not present

## 2018-10-05 DIAGNOSIS — G4720 Circadian rhythm sleep disorder, unspecified type: Secondary | ICD-10-CM | POA: Diagnosis not present

## 2018-10-05 DIAGNOSIS — F3181 Bipolar II disorder: Secondary | ICD-10-CM | POA: Diagnosis not present

## 2018-10-05 NOTE — Patient Instructions (Signed)
Increase Effexor 37.5 mg XR every morning to 2 capsules every morning during anxiety and depression currently.

## 2018-10-05 NOTE — Progress Notes (Signed)
[facility]  Patient ID: Sarah Hogan,  MRN: [id number]  PCP: Girtha Rm, NP-C  Date of Evaluation: 10/05/2018 Time spent:20 minutes  Chief Complaint:  Chief Complaint    Depression; Anxiety; Stress      HISTORY/CURRENT STATUS: Sarah Hogan is seen individually face-to-face with consent not collateral for psychiatric interview and exam in 58-month evaluation and management of bipolar depression and GAD comorbid with depression associated with her multiple sclerosis as a double depression.  She has not established another therapist and is less established in support groups.  She does have Bible study and church very supportively but finds attendance difficult physically at times.  She declines to take Tranxene again as in the past for such distress considering it blunting cognitively.  She otherwise continues her medications without change, reporting she is less concerned about her own health problems as friend is ill, though likely subconsciously she has intensified worry and despair for her illness also.  Psychosupportive and cognitive behavioral therapy is essential today to along with consideration of increase in Effexor again after having been reduced for manic symptoms months ago.  Has no psychosis, suicidality, or delirium.  Depression       The patient presents with depression.  This is a recurrent problem.  The current episode started 1 to 4 weeks ago.   The onset quality is sudden.   The problem occurs daily.  The problem has been rapidly worsening since onset.  Associated symptoms include decreased concentration, fatigue, hopelessness, insomnia, decreased interest, headaches, indigestion and sad.  Associated symptoms include no helplessness, not irritable, no restlessness, no appetite change and no suicidal ideas.     The symptoms are aggravated by social issues and family issues.  Past treatments include SNRIs - Serotonin and norepinephrine reuptake inhibitors, other  medications and psychotherapy.  Compliance with treatment is good.  Past compliance problems include difficulty with treatment plan, medication issues, medical issues and pharmacy issues.  Previous treatment provided mild relief.  Risk factors include a change in medication usage/dosage, family history, family history of mental illness, history of mental illness, major life event, menopause, stress and prior traumatic experience.   Past medical history includes chronic fatigue syndrome, chronic pain, chronic illness, physical disability, anxiety, bipolar disorder, depression and mental health disorder.     Pertinent negatives include no life-threatening condition, no recent psychiatric admission, no eating disorder, no obsessive-compulsive disorder, no post-traumatic stress disorder, no schizophrenia, no suicide attempts and no head trauma.   Individual Medical History/ Review of Systems: Changes? :Yes She has not benefited from bioidentical estrogens so that she plans to change over to less expensive prevention of Alzheimer's with other estrogens.  Disability paperwork was completed after last session curiously required only by psychiatry.  She is canceling her bubble pack service as they do not deliver medications when needed planing return to [facility] only.  Allergies: Amoxicillin  Current Medications:  Current Outpatient Medications:  .  baclofen (LIORESAL) 10 MG tablet, Take 10 mg by mouth 2 (two) times daily. , Disp: , Rfl:  .  busPIRone (BUSPAR) 10 MG tablet, TAKE 1 TABLET TWICE DAILY, Disp: 180 tablet, Rfl: 0 .  ezetimibe (ZETIA) 10 MG tablet, Take 1 tablet (10 mg total) by mouth daily., Disp: 90 tablet, Rfl: 0 .  metoprolol tartrate (LOPRESSOR) 50 MG tablet, Take 0.5 tablets (25 mg total) by mouth 2 (two) times daily., Disp: 90 tablet, Rfl: 3 .  modafinil (PROVIGIL) 100 MG tablet, Take 50 mg by mouth as needed. ,  Disp: , Rfl:  .  MYRBETRIQ 25 MG TB24 tablet, Take 25 mg by mouth daily., Disp: ,  Rfl: 3 .  Omega-3 Fatty Acids (OMEGA 3 PO), Take by mouth daily. Patient said she's taking 1 tablespoon of Omega 3 daily, Disp: , Rfl:  .  ondansetron (ZOFRAN ODT) 4 MG disintegrating tablet, Take 1 tablet (4 mg total) by mouth every 8 (eight) hours as needed for nausea or vomiting., Disp: 20 tablet, Rfl: 0 .  OXcarbazepine (TRILEPTAL) 150 MG tablet, Take 1 tablet (150 mg total) by mouth 2 (two) times daily., Disp: 180 tablet, Rfl: 0 .  pregabalin (LYRICA) 50 MG capsule, Take 50 mg by mouth daily., Disp: , Rfl:  .  topiramate (TOPAMAX) 50 MG tablet, Take 1 tablet (50 mg total) by mouth at bedtime., Disp: 90 tablet, Rfl: 0 .  venlafaxine XR (EFFEXOR-XR) 37.5 MG 24 hr capsule, Take 1 capsule (37.5 mg total) by mouth 2 (two) times daily., Disp: 90 capsule, Rfl: 0 .  ziprasidone (GEODON) 20 MG capsule, Take 1 capsule (20 mg total) by mouth at bedtime., Disp: 90 capsule, Rfl: 0   Medication Side Effects: none  Family Medical/ Social History: Changes? Yes best friend living with her temporarily until residence relocated from [place] as new diagnosis of breast cancer for which treatment has been initiated patient taking her to treatments having much anxiety and depression exacerbating as she mourns supportively.  MENTAL HEALTH EXAM: Muscle strengths and tone 5/5, postural reflexes and gait 0/0, and AIMS = 0. Blood pressure 128/86, pulse 84, height 5' 5.5" (1.664 m), weight 201 lb (91.2 kg), last menstrual period 06/28/2008.Body mass index is 32.94 kg/m.  General Appearance: Casual, Fairly Groomed, Guarded, Meticulous and Obese  Eye Contact:  Fair  Speech:  Clear and Coherent and Talkative  Volume:  Normal  Mood:  Anxious, Depressed, Dysphoric, Hopeless and Worthless  Affect:  Constricted, Depressed and Anxious  Thought Process:  Coherent and Goal Directed  Orientation: Full X 4  Thought Content: Obsessions and Rumination   Suicidal Thoughts:  No  Homicidal Thoughts:  No  Memory:  Immediate;    Fair Remote;   Good  Judgement:  Fair  Insight:  Fair  Psychomotor Activity:  Increased  Concentration:  Concentration: Fair and Attention Span: Fair  Recall:  [employer] of Knowledge: Good  Language: Good  Assets:  [employer] Social Support Talents/Skills  ADL's:  Intact  Cognition: WNL  Prognosis:  Fair    DIAGNOSES:    ICD-10-CM   1. Bipolar II disorder, moderate, depressed, with anxious distress ([place]) F31.81   2. Generalized anxiety disorder F41.1   3. Circadian rhythm sleep disorder, unspecified type G47.20     Receiving Psychotherapy: No    RECOMMENDATIONS: She continues BuSpar 10 mg twice daily often just 1 dose relieving anxiety of the past but not so much acutely having current supply for GAD.  She is no longer using as needed Tranxene.  We increase Effexor to 37.5 mg XR twice daily for menopause symptoms, bipolar depression, and generalized anxiety having current supply having previously to reduce to once daily due to hypomania symptoms.  She continues Trileptal 150 mg twice daily and Geodon 20 mg nightly for bipolar II having current supply.  Over 50% of the time today is spent in counseling and coordination of care is supportive and CBT for acute anxiety and depression including for friend's illness as well as her own currently feeling overwhelmed with responsibility and change again.  She  returns 6 weeks or sooner if needed.   Delight Hoh, MD

## 2018-10-19 ENCOUNTER — Telehealth: Payer: Self-pay | Admitting: Psychiatry

## 2018-10-19 DIAGNOSIS — F411 Generalized anxiety disorder: Secondary | ICD-10-CM

## 2018-10-19 MED ORDER — CLORAZEPATE DIPOTASSIUM 3.75 MG PO TABS: 3.7500 mg | ORAL_TABLET | Freq: Every day | ORAL | 1 refills | Status: DC | PRN

## 2018-10-19 NOTE — Telephone Encounter (Signed)
Phone review with Natale Milch regarding interim 2 weeks of exhausting anxiety now agitating such that she is willing to obtain a small supply of Tranxene which has worked well in the past though higher doses are overly tranquilizing and negatively impacting of memory.  She will stick with Tranxene 3.75 mg daily as needed for generalized anxiety #30 with 1 refill sent to [facility] on [address] medically necessary with no contraindication as increasing Effexor to 75 mg XR daily has not stabilized symptoms yet since 10/05/2018 appointment.

## 2018-10-19 NOTE — Telephone Encounter (Signed)
Patient called and said that she is non functioning right now and needs a tranquilizer. Please call her at [phone]

## 2018-10-23 ENCOUNTER — Other Ambulatory Visit: Payer: Self-pay | Admitting: Cardiology

## 2018-10-23 MED ORDER — METOPROLOL TARTRATE 50 MG PO TABS: 25.0000 mg | ORAL_TABLET | Freq: Two times a day (BID) | ORAL | 1 refills | Status: DC

## 2018-10-28 DIAGNOSIS — Z79899 Other long term (current) drug therapy: Secondary | ICD-10-CM | POA: Diagnosis not present

## 2018-10-28 DIAGNOSIS — G35 Multiple sclerosis: Secondary | ICD-10-CM | POA: Diagnosis not present

## 2018-11-16 ENCOUNTER — Other Ambulatory Visit: Payer: Self-pay

## 2018-11-16 ENCOUNTER — Encounter: Payer: Self-pay | Admitting: Psychiatry

## 2018-11-16 ENCOUNTER — Ambulatory Visit: Payer: Medicare PPO | Admitting: Psychiatry

## 2018-11-16 DIAGNOSIS — F3181 Bipolar II disorder: Secondary | ICD-10-CM | POA: Diagnosis not present

## 2018-11-16 DIAGNOSIS — F411 Generalized anxiety disorder: Secondary | ICD-10-CM

## 2018-11-16 DIAGNOSIS — G4720 Circadian rhythm sleep disorder, unspecified type: Secondary | ICD-10-CM

## 2018-11-16 NOTE — Progress Notes (Signed)
[facility]  Patient ID: Sarah Hogan,  MRN: [id number]  PCP: Girtha Rm, NP-C  Date of Evaluation: 11/16/2018 Time spent:20 minutes from 1105 to 1125  I connected with patient by a video enabled telemedicine application or telephone, with their informed consent, and verified patient privacy and that I am speaking with the correct person using two identifiers.  I was located at [facility] and patient is individually at her home townhouse residence.   Chief Complaint:   HISTORY/CURRENT STATUS: Sarah Hogan is provided telemedicine audio visual appointment session, as she declines video camera for generalized anxiety worse for consequences of multiple sclerosis, with consent with interim phone call collateral from 4 days after last appointment for psychiatric interview and exam in 6-week evaluation and management of bipolar II depressed menopausal, double depression having multiple sclerosis associated mood dysfunction, and generalized anxiety.  Whereas at last appointment patient hoped doubling Effexor 37.5 mg XR twice daily would partially alleviate anxiety to be able to function, she phoned back needing the Tranxene offered that she avoided fearing blunting of cognitive efficiency when she already has MRI findings of black holes from MS.  She reports distress that Tranxene with a potassium ligand was much more expensive $95 than that last obtained in [place] E scribed #30 with 1 refill 3.75 mg.  However the medicine did work well overall doing better .  She sleeps better and has less eye twitching as anxiety is reduced.  She has no interim manic symptoms, psychosis, delirium, or suicidality.  Depression       The patient presents with depression.  This is a recurrent problem.  The current episode started more than 1 month ago.   The onset quality is sudden.   The problem occurs intermittently.  The problem has been gradually improving since onset.  Associated symptoms include  decreased concentration, fatigue, helplessness, hopelessness, decreased interest, headaches and sad.  Associated symptoms include does not have insomnia, not irritable, no restlessness, no appetite change, no body aches and no suicidal ideas.     The symptoms are aggravated by social issues and family issues.  Past treatments include SSRIs - Selective serotonin reuptake inhibitors, SNRIs - Serotonin and norepinephrine reuptake inhibitors, other medications and psychotherapy.  Compliance with treatment is good.  Past compliance problems include medical issues, medication issues, difficulty with treatment plan and pharmacy issues.  Previous treatment provided moderate relief.  Risk factors include a change in medication usage/dosage, family history, family history of mental illness, history of mental illness, menopause, major life event, a recent illness and stress.   Past medical history includes chronic illness, recent illness, physical disability, brain trauma, anxiety, bipolar disorder, depression, mental health disorder and obsessive-compulsive disorder.     Pertinent negatives include no life-threatening condition, no eating disorder, no post-traumatic stress disorder, no schizophrenia, no suicide attempts and no head trauma.   Individual Medical History/ Review of Systems: Changes? :Yes Seeing neurologist 10/28/2018 having increased migraines addressing interventions to have a repeat MRI of the head in the fall.  Allergies: Amoxicillin  Current Medications:  Current Outpatient Medications:  .  baclofen (LIORESAL) 10 MG tablet, Take 10 mg by mouth 2 (two) times daily. , Disp: , Rfl:  .  busPIRone (BUSPAR) 10 MG tablet, TAKE 1 TABLET TWICE DAILY, Disp: 180 tablet, Rfl: 0 .  clorazepate (TRANXENE) 3.75 MG tablet, Take 1 tablet (3.75 mg total) by mouth daily as needed for anxiety., Disp: 30 tablet, Rfl: 1 .  ezetimibe (ZETIA) 10 MG tablet,  Take 1 tablet (10 mg total) by mouth daily., Disp: 90 tablet, Rfl:  0 .  metoprolol tartrate (LOPRESSOR) 50 MG tablet, Take 0.5 tablets (25 mg total) by mouth 2 (two) times daily. Please make yearly appt with Dr. Radford Pax before anymore refills. 1st attempt, Disp: 90 tablet, Rfl: 1 .  modafinil (PROVIGIL) 100 MG tablet, Take 50 mg by mouth as needed. , Disp: , Rfl:  .  MYRBETRIQ 25 MG TB24 tablet, Take 25 mg by mouth daily., Disp: , Rfl: 3 .  Omega-3 Fatty Acids (OMEGA 3 PO), Take by mouth daily. Patient said she's taking 1 tablespoon of Omega 3 daily, Disp: , Rfl:  .  ondansetron (ZOFRAN ODT) 4 MG disintegrating tablet, Take 1 tablet (4 mg total) by mouth every 8 (eight) hours as needed for nausea or vomiting., Disp: 20 tablet, Rfl: 0 .  OXcarbazepine (TRILEPTAL) 150 MG tablet, Take 1 tablet (150 mg total) by mouth 2 (two) times daily., Disp: 180 tablet, Rfl: 0 .  pregabalin (LYRICA) 50 MG capsule, Take 50 mg by mouth daily., Disp: , Rfl:  .  topiramate (TOPAMAX) 50 MG tablet, Take 1 tablet (50 mg total) by mouth at bedtime., Disp: 90 tablet, Rfl: 0 .  venlafaxine XR (EFFEXOR-XR) 37.5 MG 24 hr capsule, Take 1 capsule (37.5 mg total) by mouth 2 (two) times daily., Disp: 90 capsule, Rfl: 0 .  ziprasidone (GEODON) 20 MG capsule, Take 1 capsule (20 mg total) by mouth at bedtime., Disp: 90 capsule, Rfl: 0 Medication Side Effects: none  Family Medical/ Social History: Changes? Yes friend with breast cancer staying with patient is doing better now starting her third round of chemo.  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  MENTAL HEALTH EXAM:  Last menstrual period 06/28/2008.There is no height or weight on file to calculate BMI.  as not present here today.  General Appearance: N/A  Eye Contact:  N/A  Speech:  Clear and Coherent,  Normal Rate and Talkative  Volume:  Normal  Mood:  Anxious, Depressed, Dysphoric and Worthless  Affect:  Depressed, Inappropriate, Labile, Full Range and Anxious  Thought Process:  Disorganized, Goal Directed, Irrelevant and Linear  Orientation:  Full (Time, Place, and Person)  Thought Content: Illogical, Obsessions, Paranoid Ideation and Rumination   Suicidal Thoughts:  No  Homicidal Thoughts:  No  Memory:  Immediate;   Fair Remote;   Fair  Judgement:  Good  Insight:  Good and Fair  Psychomotor Activity:  Normal, Decreased, Mannerisms and Psychomotor Retardation  Concentration:  Concentration: Fair and Attention Span: Fair  Recall:  [employer] of Knowledge: Fair  Language: Fair  Assets:  Desire for Improvement Resilience Social Support  ADL's:  Intact  Cognition: Impaired,  Mild  Prognosis:  Poor    DIAGNOSES:    ICD-10-CM   1. Bipolar II disorder, moderate, depressed, with anxious distress, in partial remission ([place]) F31.81   2. Generalized anxiety disorder F41.1   3. Circadian rhythm sleep disorder, unspecified type G47.20     Receiving Psychotherapy: No    RECOMMENDATIONS: Patient resourcefully attempts compensation for her deficits and limitations that are multifactorial.  Therapeutic alliance with patient is essential to gaining optimal application of available treatments and resources.  Psychotherapy reached a point of maximum benefit and was  discontinued.  She is switching over from pill pack to [facility] again making her own pill pack equivalents for confident assurance that medications are dosed correctly.  She has current supply to continue by previous eScriptions of Provigil 100 mg every morning for MS associated depressive unfocused fatigue, Trileptal 150 mg twice daily for bipolar, Topamax 50 mg nightly for headache and mood/weight regulation, Effexor 37.5 mg XR twice daily for depression including of menopause and anxiety, Geodon 20 mg nightly for bipolar disorder,  and Tranxene 3.75 mg at bedtime every 3 to 4 days and daily as needed for anxiety and somatic depressive equivalents.  Return appointment is 6 weeks not yet able to stretch to 2 months.  Virtual Visit via Video Note  I connected with Wiktoria Hemrick on 11/16/18 at 11:00 AM EDT by a video enabled telemedicine application and verified that I am speaking with the correct person using two identifiers.   I discussed the limitations of evaluation and management by telemedicine and the availability of in person appointments. The patient expressed understanding and agreed to proceed.  History of Present Illness: 6-week evaluation and management address bipolar II depressed menopausal, double depression having multiple sclerosis associated mood dysfunction, and generalized anxiety.  Whereas at last appointment patient hoped doubling Effexor 37.5 mg XR twice daily would partially alleviate anxiety to be able to function, she phoned back needing the Tranxene offered that she avoided fearing blunting of cognitive efficiency when she already has MRI findings of black holes from MS.   Observations/Objective: Mood:  Anxious, Depressed, Dysphoric and Worthless  Affect:  Depressed, Inappropriate, Labile, Full Range and Anxious  Thought Process:  Disorganized, Goal Directed, Irrelevant and Linear  Orientation:  Full (Time, Place, and Person)  Thought Content: Illogical, Obsessions, Paranoid Ideation and Rumination    Assessment and Plan: She has current supply to continue by previous eScriptions of Provigil 100 mg every morning for MS associated depressive unfocused fatigue, Trileptal 150 mg twice daily for bipolar, Topamax 50 mg nightly for headache and mood/weight regulation, Effexor 37.5 mg XR twice daily for depression including of menopause and anxiety, Geodon 20 mg nightly for bipolar disorder, and Tranxene 3.75 mg at bedtime every 3 to 4 days and daily as needed for anxiety and somatic depressive  equivalents.  Follow Up Instructions: Return appointment is 6 weeks not yet able to stretch to 2 months.   I discussed the assessment and treatment plan with the patient. The patient was provided an opportunity to ask questions and all were answered. The patient agreed with the plan and demonstrated an understanding of the instructions.   The patient was advised to call back or seek an in-person evaluation if the symptoms worsen or if the condition fails to improve as anticipated.  I provided 20 minutes of non-face-to-face time during this encounter. [employer] Web[employer] meeting #[id number] Password: [email or web address]  Delight Hoh, MD   Delight Hoh, MD

## 2018-11-27 ENCOUNTER — Other Ambulatory Visit: Payer: Self-pay | Admitting: Psychiatry

## 2018-11-27 DIAGNOSIS — F3130 Bipolar disorder, current episode depressed, mild or moderate severity, unspecified: Secondary | ICD-10-CM

## 2018-12-16 ENCOUNTER — Encounter: Payer: Self-pay | Admitting: Family Medicine

## 2018-12-16 ENCOUNTER — Encounter: Payer: Self-pay | Admitting: Obstetrics & Gynecology

## 2018-12-16 ENCOUNTER — Ambulatory Visit (INDEPENDENT_AMBULATORY_CARE_PROVIDER_SITE_OTHER): Payer: Medicare PPO | Admitting: Family Medicine

## 2018-12-16 ENCOUNTER — Other Ambulatory Visit: Payer: Self-pay

## 2018-12-16 VITALS — BP 122/80 | HR 69 | Temp 98.1°F | Resp 16 | Ht 66.0 in | Wt 204.6 lb

## 2018-12-16 DIAGNOSIS — Z1159 Encounter for screening for other viral diseases: Secondary | ICD-10-CM | POA: Diagnosis not present

## 2018-12-16 DIAGNOSIS — Z789 Other specified health status: Secondary | ICD-10-CM | POA: Diagnosis not present

## 2018-12-16 DIAGNOSIS — Z114 Encounter for screening for human immunodeficiency virus [HIV]: Secondary | ICD-10-CM

## 2018-12-16 DIAGNOSIS — E559 Vitamin D deficiency, unspecified: Secondary | ICD-10-CM | POA: Diagnosis not present

## 2018-12-16 DIAGNOSIS — E2839 Other primary ovarian failure: Secondary | ICD-10-CM | POA: Diagnosis not present

## 2018-12-16 DIAGNOSIS — Z7189 Other specified counseling: Secondary | ICD-10-CM

## 2018-12-16 DIAGNOSIS — R300 Dysuria: Secondary | ICD-10-CM

## 2018-12-16 DIAGNOSIS — Z92241 Personal history of systemic steroid therapy: Secondary | ICD-10-CM | POA: Insufficient documentation

## 2018-12-16 DIAGNOSIS — N6019 Diffuse cystic mastopathy of unspecified breast: Secondary | ICD-10-CM | POA: Diagnosis not present

## 2018-12-16 DIAGNOSIS — Z87891 Personal history of nicotine dependence: Secondary | ICD-10-CM | POA: Insufficient documentation

## 2018-12-16 DIAGNOSIS — N841 Polyp of cervix uteri: Secondary | ICD-10-CM | POA: Diagnosis not present

## 2018-12-16 DIAGNOSIS — Z7185 Encounter for immunization safety counseling: Secondary | ICD-10-CM

## 2018-12-16 DIAGNOSIS — E7800 Pure hypercholesterolemia, unspecified: Secondary | ICD-10-CM

## 2018-12-16 DIAGNOSIS — Z0000 Encounter for general adult medical examination without abnormal findings: Secondary | ICD-10-CM | POA: Diagnosis not present

## 2018-12-16 HISTORY — DX: Personal history of nicotine dependence: Z87.891

## 2018-12-16 HISTORY — DX: Polyp of cervix uteri: N84.1

## 2018-12-16 LAB — POCT URINALYSIS DIP (PROADVANTAGE DEVICE)
Bilirubin, UA: NEGATIVE
Blood, UA: NEGATIVE
Glucose, UA: NEGATIVE mg/dL
Ketones, POC UA: NEGATIVE mg/dL
Leukocytes, UA: NEGATIVE
Nitrite, UA: NEGATIVE
Protein Ur, POC: NEGATIVE mg/dL
Specific Gravity, Urine: 1.01
Urobilinogen, Ur: NEGATIVE
pH, UA: 7.5 (ref 5.0–8.0)

## 2018-12-16 LAB — LIPID PANEL

## 2018-12-16 NOTE — Progress Notes (Signed)
Sarah Hogan is a 58 y.o. female who presents for annual wellness visit and follow-up on chronic medical conditions.  She has the following concerns:  States she is no longer using pill pack.   States she is taking a "bootlegged 800 ibuprofen". This is for back pain.  She is requesting that I prescribe her 800 mg ibuprofen tablets.  Dysuria- burning with urination x 2 days but resolved. She brought in a urine specimen from home. Would like to have it checked.   Hyperlipidemia- Zeta and Omega 3 fatty acid some days.   Vitamin D deficiency - taking a supplement OTC   States she is now a patient at the [facility] in [place]. Sees Mary Coffee is the provider there and states she is being prescribed Bioidentical hormones. Taking progesterone   No daily steroid use since 2007. This was for MS.    Immunization History  Administered Date(s) Administered  . Influenza Split 04/04/2013, 07/22/2013  . Influenza, Seasonal, Injecte, Preservative Fre 04/20/2009  . Influenza,inj,quad, With Preservative 07/22/2014  . Influenza-Unspecified 03/26/2018  . Tdap 07/22/2000   Last Pap smear: 2017 in [place].  History of cervical polyps. Would like to see OB/GYN.  Last mammogram: April 2020 Last colonoscopy: normal Cologuard in March 2018. Had colonoscopy when she was 45.  Last DEXA: 2008 and normal  Dentist: none Ophtho: Dr. Katy Fitch Exercise: walking dog   Smoking history - 30 pack years and stopped in 1996   Other doctors caring for patient include: Psychiatrist- Dr. Creig Hines at [facility].  Neurologist - Dr. George Hugh  Cardiologist- Dr. Radford Pax.  urogynoecology- [place] - bladder control  Depression screen:  See questionnaire below.  Depression screen [facility] 2/9 12/16/2018 12/10/2017 02/26/2017  Decreased Interest 0 3 3  Down, Depressed, Hopeless 0 3 3  PHQ - 2 Score 0 6 6  Altered sleeping - 0 3  Tired, decreased energy - 3 3  Change in appetite - 1 3  Feeling bad or failure about  yourself  - 3 3  Trouble concentrating - 3 3  Moving slowly or fidgety/restless - 3 0  Suicidal thoughts - 0 0  PHQ-9 Score - 19 21  Difficult doing work/chores - Somewhat difficult Somewhat difficult    Fall Risk Screen: see questionnaire below. Fall Risk  12/16/2018 12/10/2017 02/26/2017  Falls in the past year? 1 Yes Yes  Number falls in past yr: 0 2 or more 2 or more  Injury with Fall? 1 No Yes  Risk for fall due to : - - History of fall(s);Impaired balance/gait    ADL screen:  See questionnaire below Functional Status Survey: Is the patient deaf or have difficulty hearing?: No Does the patient have difficulty seeing, even when wearing glasses/contacts?: No Does the patient have difficulty concentrating, remembering, or making decisions?: Yes Does the patient have difficulty walking or climbing stairs?: Yes(MS) Does the patient have difficulty dressing or bathing?: No Does the patient have difficulty doing errands alone such as visiting a doctor's office or shopping?: No   End of Life Discussion:  Patient does not have a living will and medical power of attorney. MOST form reviewed and signed today. Has the paperwork but has not filled this out.   Review of Systems Constitutional: -fever, -chills, -sweats, -unexpected weight change, -anorexia, -fatigue Allergy: -sneezing, -itching, -congestion Dermatology: denies changing moles, rash, lumps, new worrisome lesions ENT: -runny nose, -ear pain, -sore throat, -hoarseness, -sinus pain, -teeth pain, -tinnitus, -hearing loss, -epistaxis Cardiology:  -chest  pain, -palpitations, -edema, -orthopnea, -paroxysmal nocturnal dyspnea Respiratory: -cough, -shortness of breath, -dyspnea on exertion, -wheezing, -hemoptysis Gastroenterology: -abdominal pain, -nausea, -vomiting, -diarrhea, -constipation, -blood in stool, -changes in bowel movement, -dysphagia Hematology: -bleeding or bruising problems Musculoskeletal: -arthralgias, -myalgias, -joint  swelling, +back pain, -neck pain, -cramping, -gait changes Ophthalmology: -vision changes, -eye redness, -itching, -discharge Urology: +dysuria, -difficulty urinating, -hematuria, -urinary frequency, -urgency, incontinence Neurology: +chronic headache, -weakness, -tingling, -numbness, -speech abnormality, -memory loss, +falls, -dizziness Psychology:  -depressed mood, -agitation, -sleep problems    PHYSICAL EXAM:  BP 122/80   Pulse 69   Temp 98.1 F (36.7 C) (Oral)   Resp 16   Ht 5\' 6"  (1.676 m)   Wt 204 lb 9.6 oz (92.8 kg)   LMP 06/28/2008   SpO2 98%   BMI 33.02 kg/m   General Appearance: Alert, cooperative, no distress, appears stated age Head: Normocephalic, without obvious abnormality, atraumatic Eyes: PERRL, conjunctiva/corneas clear, EOM's intact, fundi benign Ears: Normal TM's and external ear canals Nose: Deferred.  Mask in place due to coronavirus pandemic Throat: Deferred.  Mask in place due to current pandemic. Neck: Supple, no lymphadenopathy; thyroid: no enlargement/tenderness/nodules; no carotid bruit or JVD Back: Spine nontender, no curvature, ROM normal, no CVA tenderness Lungs: Clear to auscultation bilaterally without wheezes, rales or ronchi; respirations unlabored Chest Wall: No tenderness or deformity Heart: Regular rate and rhythm, S1 and S2 normal, no murmur, rub or gallop Breast Exam: Referral to gynecologist per her request Abdomen: Soft, non-tender, nondistended, normoactive bowel sounds, no masses, no hepatosplenomegaly Genitalia: Referral to gynecologist per her request Extremities: No clubbing, cyanosis or edema Pulses: 2+ and symmetric all extremities Skin: Skin color, texture, turgor normal, no rashes or lesions Lymph nodes: Cervical, supraclavicular, and axillary nodes normal Neurologic: CNII-XII intact, normal strength, sensation and gait; reflexes 2+ and symmetric throughout Psych: Normal mood, affect, hygiene and  grooming.  ASSESSMENT/PLAN: Medicare annual wellness visit, subsequent  Routine general medical examination at a health care facility - Plan: CBC with Differential/Platelet, Comprehensive metabolic panel, T4, free, TSH, Lipid panel  Burning with urination - Plan: POCT Urinalysis DIP (Proadvantage Device)  Cervical polyp - Plan: Ambulatory referral to Gynecology  Estrogen deficiency  History of steroid therapy  Vitamin D deficiency - Plan: VITAMIN D 25 Hydroxy (Vit-D Deficiency, Fractures)  Pure hypercholesterolemia - Plan: Lipid panel  Dysuria  Fibrocystic breast disease (FCBD), unspecified laterality - Plan: Ambulatory referral to Gynecology  Former smoker  Advance directive discussed with patient  Immunization counseling  Statin intolerance  Screening for HIV (human immunodeficiency virus) - Plan: HIV Antibody (routine testing w rflx)  Encounter for hepatitis C screening test for low risk patient - Plan: Hepatitis C antibody  She is here today for a fasting CPE, Medicare annual wellness visit as well as a follow-up on chronic health conditions. Discussed that I will defer to her neurologist who is treating her for pain.  Ibuprofen not prescribed today. Her urinalysis is negative. Does have a history of steroid therapy with a normal DEXA in the past. She is a former smoker but does not meet criteria for lung cancer screening. Per her request, I will refer her to gynecologist.  States she knows she has cervical polyps. Screening for HIV and hepatitis C per guidelines.  No documentation of this being done in the past. Vitamin D deficiency-check vitamin D level and adjust vitamin D supplement as needed. She has advanced directive paperwork at home.  States she has not filled this out.  We did update  her MOST form today and no changes.  Immunization counseling done.  She does have a prescription that I gave her last year for Tdap and Shingrix if she decides to get this.  She  knows she will need to contact her pharmacy due to her insurance. Offered pneumonia vaccine today due to her immunocompromised state and she declines. History of statin intolerance.  She does take Zetia.  Some days she takes omega-3 fish oil.  Check lipids. She will continue seeing her multiple specialists. She will be due for Cologuard in March 2021 Follow-up pending labs   Discussed monthly self breast exams and yearly mammograms; at least 30 minutes of aerobic activity at least 5 days/week and weight-bearing exercise 2x/week; proper sunscreen use reviewed; healthy diet, including goals of calcium and vitamin D intake and alcohol recommendations (less than or equal to 1 drink/day) reviewed; regular seatbelt use; changing batteries in smoke detectors.  Immunization recommendations discussed.  Colonoscopy recommendations reviewed   Medicare Attestation I have personally reviewed: The patient's medical and social history Their use of alcohol, tobacco or illicit drugs Their current medications and supplements The patient's functional ability including ADLs,fall risks, home safety risks, cognitive, and hearing and visual impairment Diet and physical activities Evidence for depression or mood disorders  The patient's weight, height, and BMI have been recorded in the chart.  I have made referrals, counseling, and provided education to the patient based on review of the above and I have provided the patient with a written personalized care plan for preventive services.     Harland Dingwall, NP-C   12/16/2018

## 2018-12-16 NOTE — Patient Instructions (Signed)
You will receive a call from [facility] gy[facility].   Sarah Hogan , Thank you for taking time to come for your Medicare Wellness Visit. I appreciate your ongoing commitment to your health goals. Please review the following plan we discussed and let me know if I can assist you in the future.      This is a list of the screening recommended for you and due dates:  Health Maintenance  Topic Date Due  .  Hepatitis C: One time screening is recommended by Center for Disease Control  (CDC) for  adults born from [birth year] through 1966.   January 17, 1961  . HIV Screening  02/09/1975  . Pap Smear  02/08/1981  . Colon Cancer Screening  02/08/2010  . Tetanus Vaccine  07/22/2010  . Flu Shot  02/20/2019  . Mammogram  09/24/2020     Preventive Care 40-64 Years, Female Preventive care refers to lifestyle choices and visits with your health care provider that can promote health and wellness. What does preventive care include?   A yearly physical exam. This is also called an annual well check.  Dental exams once or twice a year.  Routine eye exams. Ask your health care provider how often you should have your eyes checked.  Personal lifestyle choices, including: ? Daily care of your teeth and gums. ? Regular physical activity. ? Eating a healthy diet. ? Avoiding tobacco and drug use. ? Limiting alcohol use. ? Practicing safe sex. ? Taking low-dose aspirin daily starting at age 22. ? Taking vitamin and mineral supplements as recommended by your health care provider. What happens during an annual well check? The services and screenings done by your health care provider during your annual well check will depend on your age, overall health, lifestyle risk factors, and family history of disease. Counseling Your health care provider may ask you questions about your:  Alcohol use.  Tobacco use.  Drug use.  Emotional well-being.  Home and relationship well-being.  Sexual activity.  Eating habits.   Work and work Statistician.  Method of birth control.  Menstrual cycle.  Pregnancy history. Screening You may have the following tests or measurements:  Height, weight, and BMI.  Blood pressure.  Lipid and cholesterol levels. These may be checked every 5 years, or more frequently if you are over 63 years old.  Skin check.  Lung cancer screening. You may have this screening every year starting at age 36 if you have a 30-pack-year history of smoking and currently smoke or have quit within the past 15 years.  Colorectal cancer screening. All adults should have this screening starting at age 60 and continuing until age 91. Your health care provider may recommend screening at age 62. You will have tests every 1-10 years, depending on your results and the type of screening test. People at increased risk should start screening at an earlier age. Screening tests may include: ? Guaiac-based fecal occult blood testing. ? Fecal immunochemical test (FIT). ? Stool DNA test. ? Virtual colonoscopy. ? Sigmoidoscopy. During this test, a flexible tube with a tiny camera (sigmoidoscope) is used to examine your rectum and lower colon. The sigmoidoscope is inserted through your anus into your rectum and lower colon. ? Colonoscopy. During this test, a long, thin, flexible tube with a tiny camera (colonoscope) is used to examine your entire colon and rectum.  Hepatitis C blood test.  Hepatitis B blood test.  Sexually transmitted disease (STD) testing.  Diabetes screening. This is done by checking your blood  sugar (glucose) after you have not eaten for a while (fasting). You may have this done every 1-3 years.  Mammogram. This may be done every 1-2 years. Talk to your health care provider about when you should start having regular mammograms. This may depend on whether you have a family history of breast cancer.  BRCA-related cancer screening. This may be done if you have a family history of breast,  ovarian, tubal, or peritoneal cancers.  Pelvic exam and Pap test. This may be done every 3 years starting at age 49. Starting at age 93, this may be done every 5 years if you have a Pap test in combination with an HPV test.  Bone density scan. This is done to screen for osteoporosis. You may have this scan if you are at high risk for osteoporosis. Discuss your test results, treatment options, and if necessary, the need for more tests with your health care provider. Vaccines Your health care provider may recommend certain vaccines, such as:  Influenza vaccine. This is recommended every year.  Tetanus, diphtheria, and acellular pertussis (Tdap, Td) vaccine. You may need a Td booster every 10 years.  Varicella vaccine. You may need this if you have not been vaccinated.  Zoster vaccine. You may need this after age 70.  Measles, mumps, and rubella (MMR) vaccine. You may need at least one dose of MMR if you were born in 1957 or later. You may also need a second dose.  Pneumococcal 13-valent conjugate (PCV13) vaccine. You may need this if you have certain conditions and were not previously vaccinated.  Pneumococcal polysaccharide (PPSV23) vaccine. You may need one or two doses if you smoke cigarettes or if you have certain conditions.  Meningococcal vaccine. You may need this if you have certain conditions.  Hepatitis A vaccine. You may need this if you have certain conditions or if you travel or work in places where you may be exposed to hepatitis A.  Hepatitis B vaccine. You may need this if you have certain conditions or if you travel or work in places where you may be exposed to hepatitis B.  Haemophilus influenzae type b (Hib) vaccine. You may need this if you have certain conditions. Talk to your health care provider about which screenings and vaccines you need and how often you need them. This information is not intended to replace advice given to you by your health care provider. Make  sure you discuss any questions you have with your health care provider. Document Released: 08/04/2015 Document Revised: 08/28/2017 Document Reviewed: 05/09/2015 Elsevier Interactive Patient Education  2019 [employer].

## 2018-12-17 LAB — CBC WITH DIFFERENTIAL/PLATELET
Basophils Absolute: 0 10*3/uL (ref 0.0–0.2)
Basos: 0 %
EOS (ABSOLUTE): 0.1 10*3/uL (ref 0.0–0.4)
Eos: 2 %
Hematocrit: 37.7 % (ref 34.0–46.6)
Hemoglobin: 13.2 g/dL (ref 11.1–15.9)
Immature Grans (Abs): 0 10*3/uL (ref 0.0–0.1)
Immature Granulocytes: 0 %
Lymphocytes Absolute: 1 10*3/uL (ref 0.7–3.1)
Lymphs: 28 %
MCH: 30.4 pg (ref 26.6–33.0)
MCHC: 35 g/dL (ref 31.5–35.7)
MCV: 87 fL (ref 79–97)
Monocytes Absolute: 0.3 10*3/uL (ref 0.1–0.9)
Monocytes: 9 %
Neutrophils Absolute: 2.2 10*3/uL (ref 1.4–7.0)
Neutrophils: 61 %
Platelets: 150 10*3/uL (ref 150–450)
RBC: 4.34 x10E6/uL (ref 3.77–5.28)
RDW: 12.1 % (ref 11.7–15.4)
WBC: 3.5 10*3/uL (ref 3.4–10.8)

## 2018-12-17 LAB — LIPID PANEL
Chol/HDL Ratio: 4 ratio (ref 0.0–4.4)
Cholesterol, Total: 222 mg/dL — ABNORMAL HIGH (ref 100–199)
HDL: 56 mg/dL (ref >39)
LDL Calculated: 141 mg/dL — ABNORMAL HIGH (ref 0–99)
Triglycerides: 124 mg/dL (ref 0–149)
VLDL Cholesterol Cal: 25 mg/dL (ref 5–40)

## 2018-12-17 LAB — COMPREHENSIVE METABOLIC PANEL
ALT: 9 IU/L (ref 0–32)
AST: 16 IU/L (ref 0–40)
Albumin/Globulin Ratio: 2.3 — ABNORMAL HIGH (ref 1.2–2.2)
Albumin: 4.5 g/dL (ref 3.8–4.9)
Alkaline Phosphatase: 61 IU/L (ref 39–117)
BUN/Creatinine Ratio: 12 (ref 9–23)
BUN: 14 mg/dL (ref 6–24)
Bilirubin Total: 0.3 mg/dL (ref 0.0–1.2)
CO2: 22 mmol/L (ref 20–29)
Calcium: 9.7 mg/dL (ref 8.7–10.2)
Chloride: 103 mmol/L (ref 96–106)
Creatinine, Ser: 1.14 mg/dL — ABNORMAL HIGH (ref 0.57–1.00)
GFR calc Af Amer: 61 mL/min/{1.73_m2} (ref >59)
GFR calc non Af Amer: 53 mL/min/{1.73_m2} — ABNORMAL LOW (ref >59)
Globulin, Total: 2 g/dL (ref 1.5–4.5)
Glucose: 91 mg/dL (ref 65–99)
Potassium: 4.7 mmol/L (ref 3.5–5.2)
Sodium: 139 mmol/L (ref 134–144)
Total Protein: 6.5 g/dL (ref 6.0–8.5)

## 2018-12-17 LAB — HIV ANTIBODY (ROUTINE TESTING W REFLEX): HIV Screen 4th Generation wRfx: NONREACTIVE

## 2018-12-17 LAB — HEPATITIS C ANTIBODY: Hep C Virus Ab: <0.1 s/co ratio (ref 0.0–0.9)

## 2018-12-17 LAB — VITAMIN D 25 HYDROXY (VIT D DEFICIENCY, FRACTURES): Vit D, 25-Hydroxy: 26.4 ng/mL — ABNORMAL LOW (ref 30.0–100.0)

## 2018-12-17 LAB — TSH: TSH: 1.23 u[IU]/mL (ref 0.450–4.500)

## 2018-12-17 LAB — T4, FREE: Free T4: 0.81 ng/dL — ABNORMAL LOW (ref 0.82–1.77)

## 2019-01-11 ENCOUNTER — Ambulatory Visit: Payer: Medicare PPO | Admitting: Obstetrics & Gynecology

## 2019-01-12 ENCOUNTER — Ambulatory Visit: Payer: Medicare PPO | Admitting: Obstetrics & Gynecology

## 2019-01-12 ENCOUNTER — Other Ambulatory Visit: Payer: Self-pay

## 2019-01-12 ENCOUNTER — Other Ambulatory Visit (HOSPITAL_COMMUNITY)
Admission: RE | Admit: 2019-01-12 | Discharge: 2019-01-12 | Disposition: A | Discharge status: Home / Self Care | Payer: Medicare PPO | Source: Ambulatory Visit | Attending: Obstetrics & Gynecology | Admitting: Obstetrics & Gynecology

## 2019-01-12 ENCOUNTER — Encounter: Payer: Self-pay | Admitting: Obstetrics & Gynecology

## 2019-01-12 VITALS — BP 90/60 | HR 68 | Temp 97.7°F | Ht 66.25 in | Wt 194.0 lb

## 2019-01-12 DIAGNOSIS — Z01419 Encounter for gynecological examination (general) (routine) without abnormal findings: Secondary | ICD-10-CM | POA: Diagnosis not present

## 2019-01-12 DIAGNOSIS — Z23 Encounter for immunization: Secondary | ICD-10-CM

## 2019-01-12 DIAGNOSIS — Z124 Encounter for screening for malignant neoplasm of cervix: Secondary | ICD-10-CM

## 2019-01-12 DIAGNOSIS — Z0000 Encounter for general adult medical examination without abnormal findings: Secondary | ICD-10-CM

## 2019-01-12 DIAGNOSIS — Z1151 Encounter for screening for human papillomavirus (HPV): Secondary | ICD-10-CM | POA: Diagnosis not present

## 2019-01-12 MED ORDER — NONFORMULARY OR COMPOUNDED ITEM: 4 refills | Status: DC

## 2019-01-12 NOTE — Patient Instructions (Signed)
[facility]: [address] [place], [place], [place] [address]   Phone: [phone]

## 2019-01-12 NOTE — Progress Notes (Signed)
58 y.o. G0P0000 Single White or Caucasian female here for discussion of several issues/concerns and physical exam.  She is referred from [facility].  Pt does have h/o recurrent endocervical polyps.  Has been on bio-identical hormonal therapy.  Sees Dwyane Luo who prescribing her hormones.  She's never been on estrogen but has been on progesterone only.  This has really helped her hot flashes.    Is dating someone and would like to be SA in the future.  Desires recommendations.    Psychiatrist:  Dr. Creig Hines Neurologist:  Dr. George Hugh, MS specialist  Patient's last menstrual period was 06/28/2008.          Sexually active: No.  The current method of family planning is abstinence and post menopausal status.    Exercising: Yes.    walking Smoker:  no  Health Maintenance: Pap:  2018 Normal  History of abnormal Pap:  no MMG:  09/25/18 BIRADS1:neg  Colonoscopy:  2010 Normal.  Had cologuard done in [place].  This was negative but it is not due at this time.   BMD:   ~2008 Normal  TDaP:  Due  Pneumonia vaccine(s):  n/a Shingrix: No Hep C testing: 12/16/18 Neg  Screening Labs: 12/16/2018   reports that she quit smoking about 24 years ago. Her smoking use included cigarettes. She has a 30.00 pack-year smoking history. She has never used smokeless tobacco. She reports current alcohol use. She reports that she does not use drugs.  Past Medical History:  Diagnosis Date  . Bipolar 2 disorder ([place])   . Cervical polyp 12/16/2018  . Former smoker 12/16/2018  . Hyperlipidemia   . Leukopenia   . Mild aortic insufficiency    by echo 07/2016  . Mild mitral regurgitation    by echo 07/2016  . MS (multiple sclerosis) ([place])   . Normal coronary arteries cath 2015  . Overactive bladder   . PVC's (premature ventricular contractions) 04/22/2017  . SVT (supraventricular tachycardia) ([place]) 04/22/2017  . Vitamin D deficiency     Past Surgical History:  Procedure Laterality Date  . BREAST BIOPSY Left  2002  . CARPAL TUNNEL RELEASE    . CHOLECYSTECTOMY      Current Outpatient Medications  Medication Sig Dispense Refill  . baclofen (LIORESAL) 10 MG tablet Take 10 mg by mouth 2 (two) times daily.     . busPIRone (BUSPAR) 10 MG tablet TAKE 1 TABLET TWICE DAILY 180 tablet 0  . clorazepate (TRANXENE) 3.75 MG tablet Take 1 tablet (3.75 mg total) by mouth daily as needed for anxiety. 30 tablet 1  . Coenzyme Q10 (CO Q 10) 10 MG CAPS Take by mouth.    . ezetimibe (ZETIA) 10 MG tablet Take 1 tablet (10 mg total) by mouth daily. 90 tablet 0  . metoprolol tartrate (LOPRESSOR) 50 MG tablet Take 0.5 tablets (25 mg total) by mouth 2 (two) times daily. Please make yearly appt with Dr. Radford Pax before anymore refills. 1st attempt (Patient taking differently: Take 50 mg by mouth daily. Please make yearly appt with Dr. Radford Pax before anymore refills. 1st attempt) 90 tablet 1  . modafinil (PROVIGIL) 100 MG tablet Take 50 mg by mouth as needed.     Marland Kitchen MYRBETRIQ 25 MG TB24 tablet Take 25 mg by mouth daily.  3  . Omega-3 Fatty Acids (OMEGA 3 PO) Take by mouth daily. Patient said she's taking 1 tablespoon of Omega 3 daily    . ondansetron (ZOFRAN ODT) 4 MG disintegrating tablet Take 1  tablet (4 mg total) by mouth every 8 (eight) hours as needed for nausea or vomiting. 20 tablet 0  . OXcarbazepine (TRILEPTAL) 150 MG tablet TAKE 1 TABLET TWICE DAILY 180 tablet 0  . pregabalin (LYRICA) 50 MG capsule Take 50 mg by mouth daily.    . Progesterone Micronized (PROGESTERONE PO) Take 50 mg by mouth daily. Takes 100mg  daily. Bio- Identical Hormones    . topiramate (TOPAMAX) 50 MG tablet TAKE 1 TABLET AT BEDTIME 90 tablet 0  . venlafaxine (EFFEXOR) 75 MG tablet Take 75 mg by mouth daily.    . ziprasidone (GEODON) 20 MG capsule TAKE 1 CAPSULE AT BEDTIME 90 capsule 0   No current facility-administered medications for this visit.     Family History  Problem Relation Age of Onset  . Stroke Mother 12  . Dementia Mother   .  Bipolar disorder Mother   . Hypertension Brother   . Hyperlipidemia Brother   . Obesity Brother   . Breast cancer Paternal Grandmother        over 79  . Skin cancer Paternal Grandmother     Review of Systems  Genitourinary:       Cramping   All other systems reviewed and are negative.   Exam:   BP 90/60   Pulse 68   Temp 97.7 F (36.5 C) (Temporal)   Ht 5' 6.25" (1.683 m)   Wt 194 lb (88 kg)   LMP 06/28/2008   BMI 31.08 kg/m      Height: 5' 6.25" (168.3 cm)  Ht Readings from Last 3 Encounters:  01/12/19 5' 6.25" (1.683 m)  12/16/18 5\' 6"  (1.676 m)  05/19/18 5' 5.5" (1.664 m)    General appearance: alert, cooperative and appears stated age Head: Normocephalic, without obvious abnormality, atraumatic Neck: no adenopathy, supple, symmetrical, trachea midline and thyroid normal to inspection and palpation Lungs: clear to auscultation bilaterally Breasts: normal appearance, no masses or tenderness Heart: regular rate and rhythm Abdomen: soft, non-tender; bowel sounds normal; no masses,  no organomegaly Extremities: extremities normal, atraumatic, no cyanosis or edema Skin: Skin color, texture, turgor normal. No rashes or lesions Lymph nodes: Cervical, supraclavicular, and axillary nodes normal. No abnormal inguinal nodes palpated Neurologic: Grossly normal   Pelvic: External genitalia:  no lesions              Urethra:  normal appearing urethra with no masses, tenderness or lesions              Bartholins and Skenes: normal                 Vagina: atrophic appearance, no vaginal discharge or lesions              Cervix: no lesions              Pap taken: Yes.   Bimanual Exam:  Uterus:  normal size, contour, position, consistency, mobility, non-tender              Adnexa: normal adnexa and no mass, fullness, tenderness               Rectovaginal: Confirms               Anus:  normal sphincter tone, no lesions  Chaperone was present for exam.  A:  Well Woman with  normal exam PMP, on progesterone therapy MS, diagnosed 15 H/o recurrent polyps of the cervix/endocervix Family hx of breast cancer in PGM (was in her 5's) H/O  bipolar d/o OAB (saw urogyn at [facility])  P:   Mammogram guidelines reviewed.  Doing 3D MMG. Did cologuard within the last three years pap smear with HR HPV obtained today Rx for Prometrium 100mg  daily.  #90/3RF.  Rx will be sent to [facility]. Colon cancer screening discussed BMD planned around age 20 Tdap updated today Information for getting Shingrix vaccination discussed. Return annually or prn

## 2019-01-14 ENCOUNTER — Other Ambulatory Visit: Payer: Self-pay

## 2019-01-14 ENCOUNTER — Encounter: Payer: Self-pay | Admitting: Psychiatry

## 2019-01-14 ENCOUNTER — Other Ambulatory Visit: Payer: Self-pay | Admitting: Psychiatry

## 2019-01-14 ENCOUNTER — Ambulatory Visit (INDEPENDENT_AMBULATORY_CARE_PROVIDER_SITE_OTHER): Payer: Medicare PPO | Admitting: Psychiatry

## 2019-01-14 VITALS — Ht 66.0 in | Wt 192.0 lb

## 2019-01-14 DIAGNOSIS — G4720 Circadian rhythm sleep disorder, unspecified type: Secondary | ICD-10-CM | POA: Diagnosis not present

## 2019-01-14 DIAGNOSIS — F411 Generalized anxiety disorder: Secondary | ICD-10-CM | POA: Diagnosis not present

## 2019-01-14 DIAGNOSIS — F3181 Bipolar II disorder: Secondary | ICD-10-CM | POA: Diagnosis not present

## 2019-01-14 LAB — CYTOLOGY - PAP
Diagnosis: NEGATIVE
HPV: NOT DETECTED

## 2019-01-14 MED ORDER — CLONAZEPAM 0.5 MG PO TABS: 0.5000 mg | ORAL_TABLET | Freq: Two times a day (BID) | ORAL | 1 refills | Status: DC | PRN

## 2019-01-14 NOTE — Progress Notes (Signed)
[facility]  Patient ID: Sarah Hogan,  MRN: [id number]  PCP: Girtha Rm, NP-C  Date of Evaluation: 01/14/2019 Time spent:20 minutes from 1100 to 1120  Chief Complaint:  Chief Complaint    Depression; Manic Behavior; Anxiety      HISTORY/CURRENT STATUS: Sarah Hogan is seen onsite in office individually face-to-face with consent with epic collateral for psychiatric interview and exam in 32-month evaluation and management of bipolar depression, generalized anxiety, and circadian rhythm sleep disorder comorbid with multiple sclerosis having multiple complications.  She has a 66-month interval since last appointment which is the greatest length of time she has emotionally coped without review and support here over the last 2 years.  She extended only one phone call here during the last 3 months which was 2 weeks after March appointment to obtain Tranxene as Effexor increase to 75 mg XR was not sufficient for anxiety and depression. She has lost weight 9 pounds in 3 months about which she simultaneously complains but appreciates acknowledgment.  She considers herself more depressed and anxiously stressed with somatic symptoms, stating that her eyes roll with her MS lately.  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  She has reduced Lioresal and Topamax while continuing her Lyrica. She adjusts her BuSpar at night but compliantly continues her Effexor and Geodon, having 1 dose of Tranxene remaining as [employer] prescriptions requires her to change to Xanax, Ativan, or Klonopin.  [place] registry documents last Tranxene fill was 10/19/2018 at the same time she picked up her Lyrica.  In summary she is more adaptive and productive overall even though she is not accustomed to the loneliness and worry that  can accompany committed relationships and activities.  She reviews her boyfriend's acceptance of symptoms and disability as she seems to question the origin and management. She has no mania, psychosis, suicidality or delirium now.   Depression  The patient presents with depression.  This is a recurrent problem.  The current episode started more than 1 month ago.   The onset quality is sudden.   The problem occurs intermittently.  The problem has been gradually improving since onset.  Associated symptoms include decreased concentration, fatigue, helplessness, hopelessness, decreased interest, headaches and sad.  Associated symptoms include does not have insomnia, not irritable, no restlessness, no appetite change, no body aches and no suicidal ideas.     The symptoms are aggravated by social issues and family issues.  Past treatments include SSRIs - Selective serotonin reuptake inhibitors, SNRIs - Serotonin and norepinephrine reuptake inhibitors, other medications and psychotherapy.  Compliance with treatment is good.  Past compliance problems include medical issues, medication issues, difficulty with treatment plan and pharmacy issues.  Previous treatment provided moderate relief.  Risk factors include a change in medication usage/dosage, family history, family history of mental illness, history of mental illness, menopause, major life event, a recent illness and stress.   Past medical history includes chronic illness, recent illness, physical disability, brain trauma, anxiety, bipolar disorder, depression, mental health disorder and obsessive-compulsive disorder.     Pertinent negatives include no life-threatening condition, no eating disorder, no post-traumatic stress disorder, no schizophrenia, no suicide attempts and no head trauma.  Individual Medical History/ Review of Systems: Changes? :Yes  having primary care annual exam, gynecology, and neurology appointments in  the last 3 months  Allergies:  Amoxicillin  Current Medications:  Current Outpatient Medications:  .  baclofen (LIORESAL) 10 MG tablet, Take 10 mg by mouth 2 (two) times daily. , Disp: , Rfl:  .  busPIRone (BUSPAR) 10 MG tablet, TAKE 1 TABLET TWICE DAILY, Disp: 180 tablet, Rfl: 0 .  clonazePAM (KLONOPIN) 0.5 MG tablet, Take 1 tablet (0.5 mg total) by mouth 2 (two) times daily as needed for anxiety., Disp: 30 tablet, Rfl: 1 .  Coenzyme Q10 (CO Q 10) 10 MG CAPS, Take by mouth., Disp: , Rfl:  .  ezetimibe (ZETIA) 10 MG tablet, Take 1 tablet (10 mg total) by mouth daily., Disp: 90 tablet, Rfl: 0 .  metoprolol tartrate (LOPRESSOR) 50 MG tablet, Take 0.5 tablets (25 mg total) by mouth 2 (two) times daily. Please make yearly appt with Dr. Radford Pax before anymore refills. 1st attempt (Patient taking differently: Take 50 mg by mouth daily. Please make yearly appt with Dr. Radford Pax before anymore refills. 1st attempt), Disp: 90 tablet, Rfl: 1 .  modafinil (PROVIGIL) 100 MG tablet, Take 50 mg by mouth as needed. , Disp: , Rfl:  .  MYRBETRIQ 25 MG TB24 tablet, Take 25 mg by mouth daily., Disp: , Rfl: 3 .  NONFORMULARY OR COMPOUNDED ITEM, Micronized progesterone 100mg .  1 capsule orally daily., Disp: 90 each, Rfl: 4 .  Omega-3 Fatty Acids (OMEGA 3 PO), Take by mouth daily. Patient said she's taking 1 tablespoon of Omega 3 daily, Disp: , Rfl:  .  ondansetron (ZOFRAN ODT) 4 MG disintegrating tablet, Take 1 tablet (4 mg total) by mouth every 8 (eight) hours as needed for nausea or vomiting., Disp: 20 tablet, Rfl: 0 .  OXcarbazepine (TRILEPTAL) 150 MG tablet, TAKE 1 TABLET TWICE DAILY, Disp: 180 tablet, Rfl: 0 .  pregabalin (LYRICA) 50 MG capsule, Take 50 mg by mouth daily., Disp: , Rfl:  .  Progesterone Micronized (PROGESTERONE PO), Take 50 mg by mouth daily. Takes 100mg  daily. Bio- Identical Hormones, Disp: , Rfl:  .  topiramate (TOPAMAX) 50 MG tablet, Take 0.5 tablets (25 mg total) by mouth at bedtime., Disp: 90 tablet, Rfl: 0 .  venlafaxine  (EFFEXOR) 75 MG tablet, Take 75 mg by mouth daily., Disp: , Rfl:  .  ziprasidone (GEODON) 20 MG capsule, TAKE 1 CAPSULE AT BEDTIME, Disp: 90 capsule, Rfl: 0   Medication Side Effects: none that she wishes to reduce medication to cope more naturally in her new relationship.  Family Medical/ Social History: Changes? No with mother having bipolar, lupus and dementia while father had substance use with alcohol.  MENTAL HEALTH EXAM:  Height 5\' 6"  (1.676 m), weight 192 lb (87.1 kg), last menstrual period 06/28/2008.Body mass index is 30.99 kg/m.  Others deferred as nonessential in coronavirus pandemic  General Appearance: Fairly Groomed, Guarded, Meticulous and Obese  Eye Contact:  Good  Speech:  Clear and Coherent, Normal Rate and Talkative  Volume:  Normal  Mood:  Anxious, Depressed, Dysphoric, Irritable and Worthless  Affect:  Inappropriate, Labile, Full Range and Anxious and depressed  Thought Process:  Coherent, Goal Directed and Irrelevant  Orientation:  Full (Time, Place, and Person)  Thought Content: Illogical, Ilusions, Obsessions and Rumination   Suicidal Thoughts:  No  Homicidal Thoughts:  No  Memory:  Immediate;   Fair Remote;   Fair  Judgement:  Fair  Insight:  Fair  Psychomotor Activity:  Increased, Decreased, Mannerisms and Restlessness  Concentration:  Concentration: Fair and Attention Span: Fair  Recall:  Turner of Knowledge: Good  Language: Good  Assets:  Leisure Time Resilience Talents/Skills  ADL's:  Intact  Cognition: WNL  Prognosis:  Poor    DIAGNOSES:    ICD-10-CM   1. Bipolar II disorder, moderate, depressed, with anxious distress (HCC)  F31.81 topiramate (TOPAMAX) 50 MG tablet  2. Generalized anxiety disorder  F41.1 clonazePAM (KLONOPIN) 0.5 MG tablet  3. Circadian rhythm sleep disorder, unspecified type  G47.20    Depression screen:  See questionnaire below.  Depression screen [facility] 2/9 12/16/2018 12/10/2017 02/26/2017  Decreased Interest 0 3 3  Down,  Depressed, Hopeless 0 3 3  PHQ - 2 Score 0 6 6  Altered sleeping - 0 3  Tired, decreased energy - 3 3  Change in appetite - 1 3  Feeling bad or failure about yourself  - 3 3  Trouble concentrating - 3 3  Moving slowly or fidgety/restless - 3 0  Suicidal thoughts - 0 0  PHQ-9 Score - 19 21    Receiving Psychotherapy: No    RECOMMENDATIONS: Treatment need is evident from all perspectives including PHQ 9 in PCP office.  Over there are some distractive adaptations possible psychosocial function.  Tranxene is discontinued and changed to Klonopin 0.5 mg twice daily if needed for generalized anxiety, though she often uses just 1/2 tablet a day as #30 with 1 refill to [facility] on [address].  Topamax for migraine and compulsivity concerns for weight is reduced to 25 mg nightly having current supply.  Current supply also for Trileptal 150 mg twice daily, Geodon 20 mg nightly, Buspar up to 10 mg twice daily, and Effexor 75 mg XR every morning for bipolar II and generalized anxiety.  She has Provigil 100 mg taking usually 1/2 to 1  tablet daily as needed for dysphoria, fatigue, and focus problems with bipolar and multiple sclerosis.  She returns in 2 months for follow up.   Delight Hoh, MD

## 2019-01-15 ENCOUNTER — Other Ambulatory Visit: Payer: Self-pay

## 2019-01-15 MED ORDER — EZETIMIBE 10 MG PO TABS: 10.0000 mg | ORAL_TABLET | Freq: Every day | ORAL | 0 refills | Status: DC

## 2019-02-02 MED FILL — SHINGRIX 50 MCG SUS: 50 | 1 days supply | Qty: 1 | Fill #0

## 2019-03-04 ENCOUNTER — Telehealth: Payer: Self-pay | Admitting: Psychiatry

## 2019-03-04 NOTE — Telephone Encounter (Signed)
Sarah Hogan leaves message through reception that she is stressed but not fully manic but is excessively shopping, eating, and reactive whether from the environment or her bipolar pathology doubtfully anxiety.  I returned her call reaching only the answering machine clarifying the roles of Geodon, Trileptal, and Effexor.  Her options would be to reduce the Effexor or increase one or both of the other 2 medications.  I discussed the possibility of a short-term and long-term adjustment based on the stress phenomena she is formulating.  I suggest foremost that she double the Trileptal 150 mg to 2 tablets twice daily for at least several weeks to months, and she plans an appointment in the near future where we can discuss whether to return to just the 150 mg twice daily or to go up to the 300 mg tablet twice daily.  She will call back for any additional concerns

## 2019-03-04 NOTE — Telephone Encounter (Signed)
Phone call to patient regarding options for managing self-described hypomanic shopping and spending symptoms of the last week or two agreeing to increase Trileptal 150mg  to 2 tablets twice daily without change in Geodon or yet in Effexor unless not improved in a week then Effexor can be reduced again to 37.5 mg XR.  She understands and formulates her own next steps for medications and hand with prayer partner there in support.

## 2019-03-16 ENCOUNTER — Other Ambulatory Visit: Payer: Self-pay

## 2019-03-16 ENCOUNTER — Encounter: Payer: Self-pay | Admitting: Psychiatry

## 2019-03-16 ENCOUNTER — Ambulatory Visit (INDEPENDENT_AMBULATORY_CARE_PROVIDER_SITE_OTHER): Payer: Medicare PPO | Admitting: Psychiatry

## 2019-03-16 VITALS — Ht 66.0 in | Wt 192.0 lb

## 2019-03-16 DIAGNOSIS — G4720 Circadian rhythm sleep disorder, unspecified type: Secondary | ICD-10-CM | POA: Diagnosis not present

## 2019-03-16 DIAGNOSIS — F411 Generalized anxiety disorder: Secondary | ICD-10-CM | POA: Diagnosis not present

## 2019-03-16 DIAGNOSIS — F3181 Bipolar II disorder: Secondary | ICD-10-CM | POA: Diagnosis not present

## 2019-03-16 NOTE — Progress Notes (Signed)
[facility]  Patient ID: Sarah Hogan,  MRN: [id number]  PCP: Girtha Rm, NP-C  Date of Evaluation: 03/16/2019 Time spent:20 minutes from 1100 to 1120  Chief Complaint:  Chief Complaint    Manic Behavior; Depression; Anxiety; Stress      HISTORY/CURRENT STATUS: Sarah Hogan is seen onsite in office face-to-face with consent individually with epic collateral for psychiatric interview and exam in 89-month evaluation and management of bipolar, generalized anxiety, circadian rhythm sleep disorder, and comorbid complicated multiple sclerosis.  Sarah Hogan reviews foremost that she increased Trileptal by 1 tablet 2 weeks ago after urgent phone call that she was hypomanic in her shopping and eating unable to sleep well sometimes for days.  She is therefore taking the Trileptal 150 mg as 1.5 tablets total 225 mg twice daily without decrease in Effexor or increase in Geodon all discussed at that time.  Sleep is improved including by taking Klonopin 0.5 mg at night which also helps some eye twitching that has emerged again whether associated with MS, stress, or modest primary tic disorder.  Her therapeutic goal is to prevent MS flareup and to prevent depression as the other problems can be more readily addressed.  Her psychosocial stressors otherwise are the most difficult with friend still needin treatment for breast cancer delayed until healing from heart attack deferring the treatment needing Sarah Hogan's care and support.  Boyfriend Sarah Hogan remains stuck in [place] as she states Sarah Hogan is compounding some of his computer shipment wanting 80K in tariffs.  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  She worries about weight gain but such has not occurred in the last 2 months.  She is attempting to deal with her overspending by credit card.  She has no psychosis, suicidality, delirium, or dissociation.  Depression             The patient presents withrelief of  depression a recurrentproblem by hypomanic episode episode starting  2 to 4 weeks ago. The onset quality is sudden. The problem occurs intermittently.The problem has been gradually improvingsince onset.Associated symptoms include insomnia,irritable,restlessness, increased concentration,increased interest,headachesand agitated. Associated symptoms include does not have,no appetite change,no fatigue,no helplessness, nohopelessness,no body achesand no suicidal ideas.The symptoms are aggravated by social issues and family issues.Past treatments include SSRIs - Selective serotonin reuptake inhibitors, SNRIs - Serotonin and norepinephrine reuptake inhibitors, other medications and psychotherapy.Compliance with treatment is good.Past compliance problems include medical issues, medication issues, difficulty with treatment plan and pharmacy issues.Previous treatment provided moderaterelief.Risk factors include a change in medication usage/dosage, family history, family history of mental illness, history of mental illness, menopause, major life event, a recent illness and stress. Past medical history includes chronic illness,recent illness,physical disability,brain trauma,anxiety,bipolar disorder,depression,mental health disorderand obsessive-compulsive disorder. Pertinent negatives include no life-threatening condition,no eating disorder,no post-traumatic stress disorder,no schizophrenia,no suicide attemptsand no head trauma.  Individual Medical History/ Review of Systems: Changes? :Yes Periorbital eye twitching is likely sleepless fatigue and benign though she worries about MS or tic disorder.  [place] registry documents Lyrica 02/01/2019 and Klonopin on 01/14/2019 all appropriate.  Allergies: Amoxicillin  Current Medications:  Current Outpatient Medications:  .  baclofen (LIORESAL) 10 MG tablet, Take 10 mg by mouth 2 (two) times daily. , Disp: , Rfl:  .  busPIRone  (BUSPAR) 10 MG tablet, TAKE 1 TABLET TWICE DAILY, Disp: 180 tablet, Rfl: 0 .  clonazePAM (KLONOPIN) 0.5 MG tablet, Take 1 tablet (0.5 mg total) by mouth 2 (two) times daily as needed for anxiety.,  Disp: 30 tablet, Rfl: 1 .  Coenzyme Q10 (CO Q 10) 10 MG CAPS, Take by mouth., Disp: , Rfl:  .  ezetimibe (ZETIA) 10 MG tablet, Take 1 tablet (10 mg total) by mouth daily., Disp: 90 tablet, Rfl: 0 .  metoprolol tartrate (LOPRESSOR) 50 MG tablet, Take 0.5 tablets (25 mg total) by mouth 2 (two) times daily. Please make yearly appt with Dr. Radford Pax before anymore refills. 1st attempt (Patient taking differently: Take 50 mg by mouth daily. Please make yearly appt with Dr. Radford Pax before anymore refills. 1st attempt), Disp: 90 tablet, Rfl: 1 .  modafinil (PROVIGIL) 100 MG tablet, Take 50 mg by mouth as needed. , Disp: , Rfl:  .  MYRBETRIQ 25 MG TB24 tablet, Take 25 mg by mouth daily., Disp: , Rfl: 3 .  NONFORMULARY OR COMPOUNDED ITEM, Micronized progesterone 100mg .  1 capsule orally daily., Disp: 90 each, Rfl: 4 .  Omega-3 Fatty Acids (OMEGA 3 PO), Take by mouth daily. Patient said she's taking 1 tablespoon of Omega 3 daily, Disp: , Rfl:  .  ondansetron (ZOFRAN ODT) 4 MG disintegrating tablet, Take 1 tablet (4 mg total) by mouth every 8 (eight) hours as needed for nausea or vomiting., Disp: 20 tablet, Rfl: 0 .  OXcarbazepine (TRILEPTAL) 150 MG tablet, Take 1.5 tablets (225 mg total) by mouth 2 (two) times daily., Disp: 180 tablet, Rfl: 0 .  pregabalin (LYRICA) 50 MG capsule, Take 50 mg by mouth daily., Disp: , Rfl:  .  Progesterone Micronized (PROGESTERONE PO), Take 50 mg by mouth daily. Takes 100mg  daily. Bio- Identical Hormones, Disp: , Rfl:  .  topiramate (TOPAMAX) 50 MG tablet, Take 0.5 tablets (25 mg total) by mouth at bedtime., Disp: 90 tablet, Rfl: 0 .  venlafaxine (EFFEXOR) 75 MG tablet, Take 75 mg by mouth daily., Disp: , Rfl:  .  ziprasidone (GEODON) 20 MG capsule, TAKE 1 CAPSULE AT BEDTIME, Disp: 90  capsule, Rfl: 0   Medication Side Effects: none  Family Medical/ Social History: Changes? Yes as she seems stressed by niece having a baby in [place] as though she would not be able to visit unless traveling there with other relatives.  MENTAL HEALTH EXAM:  Height 5\' 6"  (1.676 m), weight 192 lb (87.1 kg), last menstrual period 06/28/2008.Body mass index is 30.99 kg/m.  Otherwise deferred for coronavirus pandemic except Muscle strengths 4+/4+, postural reflexes -2/-2, and and tandem gait not possible, with AIMS = 0.  General Appearance: Casual, Fairly Groomed, Guarded, Meticulous and Obese  Eye Contact:  Fair  Speech:  Clear and Coherent, Normal Rate and Talkative  Volume:  Normal  Mood:  Anxious, Euthymic and Irritable  Affect:  Inappropriate, Labile, Full Range and Anxious  Thought Process:  Coherent, Goal Directed and Irrelevant  Orientation:  Full (Time, Place, and Person)  Thought Content: Ilusions, Obsessions and Rumination   Suicidal Thoughts:  No  Homicidal Thoughts:  No  Memory:  Immediate;   Good Remote;   Fair  Judgement:  Fair  Insight:  Fair  Psychomotor Activity:  Normal, Increased, Mannerisms and Restlessness  Concentration:  Concentration: Fair and Attention Span: Fair  Recall:  Good  Fund of Knowledge: Good  Language: Good  Assets:  Desire for Improvement Leisure Time Resilience  ADL's:  Intact  Cognition: WNL  Prognosis:  Poor    DIAGNOSES:    ICD-10-CM   1. Bipolar II disorder, moderate, depressed, with anxious distress ([place])  F31.81   2. Generalized anxiety disorder  F41.1  3. Circadian rhythm sleep disorder, unspecified type  G47.20     Receiving Psychotherapy: No    RECOMMENDATIONS: Patrcia is stressed by whether she is making too many changes in her medications and thereby destabilizing her mood disorder and MS.  Sustaining the change 2 weeks ago and her Trileptal is therefore necessary for mood disorder and anxiety.  Trileptal current supply  is maintained is 150 mg tablet taking 1.5 tablets twice daily for bipolar disorder.  She continues Topamax 50 mg taking 1/2 tablet daily at bedtime having current supply for migraine that will also stabilize tics and hyperphagia.  She continues Effexor 75 mg XR every morning current supply for bipolar depression and anxiety.  She continues Geodon 20 mg capsule every bedtime for bipolar disorder.  Provigil 100 mg discontinued as 1/2 tablet every morning last E scribed by neurology for circadian rhythm sleep disorder and fatigue and centration problems of MS having current supply.  BuSpar 10 mg twice daily is generally taken only at night having current supply at [facility] for generalized anxiety.  Ativan 0.5 mg twice daily as needed current supply through [facility] is for generalized anxiety and bipolar hypomania.  She returns in 2 months off often calling in the interim for advice medically on her medication and psychosupportive care as with today.   Delight Hoh, MD

## 2019-03-30 ENCOUNTER — Other Ambulatory Visit: Payer: Self-pay | Admitting: Psychiatry

## 2019-03-30 DIAGNOSIS — F3130 Bipolar disorder, current episode depressed, mild or moderate severity, unspecified: Secondary | ICD-10-CM

## 2019-04-02 ENCOUNTER — Telehealth: Payer: Self-pay

## 2019-04-02 NOTE — Telephone Encounter (Signed)
Prior authorization submitted and approved for Oxcarbazepine 150 mg 1.5 tablets bid effective 07/22/2018-07/22/2019 through [facility]

## 2019-04-05 MED FILL — SHINGRIX 50 MCG SUS: 50 | 1 days supply | Qty: 1 | Fill #1

## 2019-04-09 ENCOUNTER — Other Ambulatory Visit: Payer: Self-pay | Admitting: Cardiology

## 2019-04-16 ENCOUNTER — Other Ambulatory Visit: Payer: Self-pay

## 2019-04-16 MED ORDER — METOPROLOL TARTRATE 50 MG PO TABS: 25.0000 mg | ORAL_TABLET | Freq: Two times a day (BID) | ORAL | 0 refills | Status: DC

## 2019-04-22 DIAGNOSIS — G35 Multiple sclerosis: Secondary | ICD-10-CM | POA: Diagnosis not present

## 2019-04-22 DIAGNOSIS — M792 Neuralgia and neuritis, unspecified: Secondary | ICD-10-CM | POA: Diagnosis not present

## 2019-04-22 DIAGNOSIS — Z79899 Other long term (current) drug therapy: Secondary | ICD-10-CM | POA: Diagnosis not present

## 2019-04-22 DIAGNOSIS — R252 Cramp and spasm: Secondary | ICD-10-CM | POA: Diagnosis not present

## 2019-04-24 ENCOUNTER — Other Ambulatory Visit: Payer: Self-pay | Admitting: Family Medicine

## 2019-05-11 ENCOUNTER — Other Ambulatory Visit: Payer: Self-pay

## 2019-05-11 ENCOUNTER — Ambulatory Visit (INDEPENDENT_AMBULATORY_CARE_PROVIDER_SITE_OTHER): Payer: Medicare PPO | Admitting: Psychiatry

## 2019-05-11 ENCOUNTER — Encounter: Payer: Self-pay | Admitting: Psychiatry

## 2019-05-11 VITALS — Ht 66.0 in | Wt 195.0 lb

## 2019-05-11 DIAGNOSIS — F3181 Bipolar II disorder: Secondary | ICD-10-CM | POA: Diagnosis not present

## 2019-05-11 DIAGNOSIS — G4720 Circadian rhythm sleep disorder, unspecified type: Secondary | ICD-10-CM

## 2019-05-11 DIAGNOSIS — F411 Generalized anxiety disorder: Secondary | ICD-10-CM

## 2019-05-11 NOTE — Progress Notes (Signed)
[facility]  Patient ID: Sarah Hogan,  MRN: [id number]  PCP: Girtha Rm, NP-C  Date of Evaluation: 05/11/2019 Time spent:20 minutes from 1100 to 1120  Chief Complaint:  Chief Complaint    Depression; Manic Behavior; Agitation; Anxiety      HISTORY/CURRENT STATUS: Sarah Hogan is seen onsite in office 20 minutes face-to-face with consent individually with epic collateral for psychiatric interview and exam in 67-month evaluation and management of bipolar depressed, generalized anxiety, and circadian rhythm sleep disorder.  She takes modafinil 100 mg in the morning for her sleep disorder and major responsibilities such as driving, however she is using less modafinil overall and is taking clonazepam 0.5 mg every night but not often the trazodone.  She is reducing her Trileptal the last 1 to 2 weeks from 225 mg to 150 mg both being twice daily, continuing Effexor 75 mg and Geodon 20 mg.  Her BuSpar is mainly 10 mg at night, and clonazepam is taken 0.5 mg nightly and once in the day as needed, as she also continues her Topamax.  She knows her medications and remains most often mildly to moderately depressed with her bipolar type II.  She attributes her lack of discussing politics and speaking out at family gatherings to her generalized anxiety.  She is currently still worried about Eddie in [place] who has not acquired the money to obtain his shipment from [place] and cannot return home to the [place] until business is completed, so that she misses him talking on the phone once or twice a month otherwise just texting.  Her dog is a 63 year old Maltese afflicted with spinal stenosis, so that his activity is limited and he is frustrated.  She has family plans for Thanksgiving but not beyond.  She is reading more.  She has lost 30 pounds in the last year with weight fluctuating down from 185-195 which she does not understand .  She is greatly worried about the election and country.  Neurology appointment  October 1 was on line of which she disapproved as they estimated increased tone in the right lower extremity and weakness in the left hip flexors by telemedicine.  She has no suicidality, mania, psychosis, or delirium, as she observes in her dog as he takes prednisone the same excessive eating and excessive urination that she experiences when she requires prednisone for her MS. she has no current mania, psychosis, suicidality or delirium.   Individual Medical History/ Review of Systems: Changes? [facility] appointment 04/22/2019 was a phone appointment that patient did not wish to complete but had no other time alternative allowed by that office.  Patient processes the conflicts of those reacting to her medical problems from providers to relatives.  She tends to keep medication number and dose low as possible though still being mildly concerned about difficult to manage pathology.  Weight is up 3 pounds after having lost 30 pounds in the last year.  Allergies: Amoxicillin  Current Medications:  Current Outpatient Medications:  .  baclofen (LIORESAL) 10 MG tablet, Take 10 mg by mouth 2 (two) times daily. , Disp: , Rfl:  .  busPIRone (BUSPAR) 10 MG tablet, TAKE 1 TABLET TWICE DAILY, Disp: 180 tablet, Rfl: 0 .  clonazePAM (KLONOPIN) 0.5 MG tablet, Take 1 tablet (0.5 mg total) by mouth 2 (two) times daily as needed for anxiety., Disp: 30 tablet, Rfl: 1 .  Coenzyme Q10 (CO Q 10) 10 MG CAPS, Take by mouth., Disp: , Rfl:  .  ezetimibe (ZETIA) 10  MG tablet, Take 1 tablet by mouth daily, Disp: 90 tablet, Rfl: 0 .  metoprolol tartrate (LOPRESSOR) 50 MG tablet, Take 0.5 tablets (25 mg total) by mouth 2 (two) times daily. Pt needs to keep upcoming appt in Oct for further refills., Disp: 90 tablet, Rfl: 0 .  modafinil (PROVIGIL) 100 MG tablet, Take 50 mg by mouth as needed. , Disp: , Rfl:  .  MYRBETRIQ 25 MG TB24 tablet, Take 25 mg by mouth daily., Disp: , Rfl: 3 .  NONFORMULARY OR COMPOUNDED ITEM, Micronized  progesterone 100mg .  1 capsule orally daily., Disp: 90 each, Rfl: 4 .  Omega-3 Fatty Acids (OMEGA 3 PO), Take by mouth daily. Patient said she's taking 1 tablespoon of Omega 3 daily, Disp: , Rfl:  .  ondansetron (ZOFRAN ODT) 4 MG disintegrating tablet, Take 1 tablet (4 mg total) by mouth every 8 (eight) hours as needed for nausea or vomiting., Disp: 20 tablet, Rfl: 0 .  OXcarbazepine (TRILEPTAL) 150 MG tablet, Take 1 tablet (150 mg total) by mouth 2 (two) times daily., Disp: 270 tablet, Rfl: 0 .  pregabalin (LYRICA) 50 MG capsule, Take 50 mg by mouth daily., Disp: , Rfl:  .  Progesterone Micronized (PROGESTERONE PO), Take 50 mg by mouth daily. Takes 100mg  daily. Bio- Identical Hormones, Disp: , Rfl:  .  topiramate (TOPAMAX) 50 MG tablet, Take 0.5 tablets (25 mg total) by mouth at bedtime., Disp: 90 tablet, Rfl: 0 .  venlafaxine (EFFEXOR) 75 MG tablet, Take 75 mg by mouth daily., Disp: , Rfl:  .  ziprasidone (GEODON) 20 MG capsule, TAKE 1 CAPSULE AT BEDTIME, Disp: 90 capsule, Rfl: 0 j Medication Side Effects: none  Family Medical/ Social History: Changes? Yes brother from [place] is coming for Thanksgiving be held at home of patient's other brother locally.    MENTAL HEALTH EXAM:  Height 5\' 6"  (1.676 m), weight 195 lb (88.5 kg), last menstrual period 06/28/2008.Body mass index is 31.47 kg/m. Muscle strengths 5/4+ and tone +1/0, postural reflexes and gait 0/0, and AIMS = 0 others deferred for coronavirus shutdown  General Appearance: Casual, Fairly Groomed, Guarded and Obese  Eye Contact:  Good  Speech:  Clear and Coherent, Normal Rate and Talkative  Volume:  Normal  Mood:  Anxious, Depressed, Dysphoric, Euthymic and Irritable  Affect:  Depressed, Inappropriate, Restricted and Anxious  Thought Process:  Coherent, Goal Directed, Irrelevant and Descriptions of Associations: Tangential  Orientation:  Full (Time, Place, and Person)  Thought Content: Ilusions, Rumination and Tangential    Suicidal Thoughts:  No  Homicidal Thoughts:  No  Memory:  Immediate;   Good Remote;   Fair  Judgement:  Fair  Insight:  Fair  Psychomotor Activity:  Normal, Mannerisms and Restlessness  Concentration:  Concentration: Fair and Attention Span: Fair  Recall:  Good  Fund of Knowledge: Good  Language: Good  Assets:  Leisure Time Resilience Talents/Skills  ADL's:  Intact  Cognition: WNL  Prognosis:  Poor    DIAGNOSES:    ICD-10-CM   1. Bipolar II disorder, moderate, depressed, with anxious distress (H[facility])  F31.81 OXcarbazepine (TRILEPTAL) 150 MG tablet  2. Generalized anxiety disorder  F41.1   3. Circadian rhythm sleep disorder, unspecified type  G47.20     Receiving Psychotherapy: No    RECOMMENDATIONS: After several local therapists, Nikol addresses all of her conflict and loss here having extended her appointments now to every 2 months encouraging individuated coping and confidence as possible.  Her refills are provided through [facility]  mail service in extended prescriptions.  She continues Topamax 50 mg every bedtime and Trileptal 150 mg twice daily for bipolar disorder and consequences of MS.  She has Geodon 20 mg nightly and Effexor 75 mg XR daily for bipolar disorder and generalized anxiety.  Modafinil 100 mg every morning and BuSpar 10 mg every bedtime for circadian rhythm sleep disorder and generalized anxiety.  She has Klonopin 0.5 mg twice daily as needed and trazodone 50 mg at bedtime as needed for insomnia or diurnal Klonopin for anxiety.  His current supply return for follow-up in 2 months.  Delight Hoh, MD

## 2019-05-12 ENCOUNTER — Ambulatory Visit: Payer: Medicare PPO | Admitting: Cardiology

## 2019-05-12 ENCOUNTER — Other Ambulatory Visit: Payer: Self-pay

## 2019-05-12 ENCOUNTER — Encounter: Payer: Self-pay | Admitting: Cardiology

## 2019-05-12 VITALS — BP 98/64 | HR 50 | Ht 66.5 in | Wt 194.6 lb

## 2019-05-12 DIAGNOSIS — I493 Ventricular premature depolarization: Secondary | ICD-10-CM | POA: Diagnosis not present

## 2019-05-12 DIAGNOSIS — E7800 Pure hypercholesterolemia, unspecified: Secondary | ICD-10-CM | POA: Diagnosis not present

## 2019-05-12 DIAGNOSIS — I351 Nonrheumatic aortic (valve) insufficiency: Secondary | ICD-10-CM | POA: Diagnosis not present

## 2019-05-12 DIAGNOSIS — I471 Supraventricular tachycardia: Secondary | ICD-10-CM | POA: Diagnosis not present

## 2019-05-12 DIAGNOSIS — I340 Nonrheumatic mitral (valve) insufficiency: Secondary | ICD-10-CM | POA: Diagnosis not present

## 2019-05-12 MED ORDER — METOPROLOL SUCCINATE ER 25 MG PO TB24: 25.0000 mg | ORAL_TABLET | Freq: Every day | ORAL | 3 refills | Status: DC

## 2019-05-12 NOTE — Progress Notes (Signed)
Cardiology Office Note:    Date:  05/12/2019   ID:  Sarah Hogan, DOB 1961-07-22, MRN [id number]  PCP:  Girtha Rm, NP-C  Cardiologist:  Fransico Him, MD    Referring MD: Girtha Rm, NP-C   Chief Complaint  Patient presents with  . Follow-up    SVT, PVCs, HLD, MR    History of Present Illness:    Sarah Hogan is a 58 y.o. female with a hx of hyperlipidemia, MS, GERD, migraines, bipolar disorder, tobacco abuse with 40 pk year history of smoking and normal coronary arteries by cath for positive stress test done for fatigue in 2015.  She has a history SVT with PACs and PVCs on event monitor. Her echo 08/2016 showed normal LVF with ER 65-70% with mild AR and MR.  She is here today for followup and is doing well.  She denies any chest pain or pressure, SOB, DOE, PND, orthopnea, LE edema, dizziness or syncope. She will have minimal palpitations sporadically that are not bothersome. She is compliant with her meds and is tolerating meds with no SE.    Past Medical History:  Diagnosis Date  . Bipolar 2 disorder ([place])   . Cervical polyp 12/16/2018  . Former smoker 12/16/2018  . Hyperlipidemia   . Leukopenia   . Mild aortic insufficiency    by echo 07/2016  . Mild mitral regurgitation    by echo 07/2016  . MS (multiple sclerosis) ([place])   . Normal coronary arteries cath 2015  . Overactive bladder   . PVC's (premature ventricular contractions) 04/22/2017  . SVT (supraventricular tachycardia) ([place]) 04/22/2017  . Vitamin D deficiency     Past Surgical History:  Procedure Laterality Date  . BREAST BIOPSY Left 2002  . CARPAL TUNNEL RELEASE    . CHOLECYSTECTOMY      Current Medications: Current Meds  Medication Sig  . baclofen (LIORESAL) 10 MG tablet Take 10 mg by mouth 2 (two) times daily.   . busPIRone (BUSPAR) 10 MG tablet TAKE 1 TABLET TWICE DAILY  . clonazePAM (KLONOPIN) 0.5 MG tablet Take 1 tablet (0.5 mg total) by mouth 2 (two) times daily as needed for anxiety.  Marland Kitchen  ezetimibe (ZETIA) 10 MG tablet Take 1 tablet by mouth daily  . metoprolol tartrate (LOPRESSOR) 50 MG tablet Take 0.5 tablets (25 mg total) by mouth 2 (two) times daily. Pt needs to keep upcoming appt in Oct for further refills.  Marland Kitchen MYRBETRIQ 25 MG TB24 tablet Take 25 mg by mouth daily.  . OXcarbazepine (TRILEPTAL) 150 MG tablet Take 1 tablet (150 mg total) by mouth 2 (two) times daily.  . pregabalin (LYRICA) 50 MG capsule Take 50 mg by mouth daily.  . Progesterone Micronized (PROGESTERONE PO) Take 50 mg by mouth daily. Takes 100mg  daily. Bio- Identical Hormones  . topiramate (TOPAMAX) 50 MG tablet Take 0.5 tablets (25 mg total) by mouth at bedtime.  Marland Kitchen venlafaxine (EFFEXOR) 75 MG tablet Take 75 mg by mouth daily.  . ziprasidone (GEODON) 20 MG capsule TAKE 1 CAPSULE AT BEDTIME     Allergies:   Amoxicillin   Social History   Socioeconomic History  . Marital status: Single    Spouse name: Not on file  . Number of children: Not on file  . Years of education: Not on file  . Highest education level: Not on file  Occupational History  . Not on file  Social Needs  . Financial resource strain: Not on file  . Food insecurity  Worry: Not on file    Inability: Not on file  . Transportation needs    Medical: Not on file    Non-medical: Not on file  Tobacco Use  . Smoking status: Former Smoker    Packs/day: 1.50    Years: 20.00    Pack years: 30.00    Types: Cigarettes    Quit date: 10/28/1994    Years since quitting: 24.5  . Smokeless tobacco: Never Used  Substance and Sexual Activity  . Alcohol use: Yes    Alcohol/week: 0.0 - 1.0 standard drinks  . Drug use: No  . Sexual activity: Not Currently    Comment: since 2008  Lifestyle  . Physical activity    Days per week: Not on file    Minutes per session: Not on file  . Stress: Not on file  Relationships  . Social Herbalist on phone: Not on file    Gets together: Not on file    Attends religious service: Not on file     Active member of club or organization: Not on file    Attends meetings of clubs or organizations: Not on file    Relationship status: Not on file  Other Topics Concern  . Not on file  Social History Narrative  . Not on file     Family History: The patient's family history includes Bipolar disorder in her mother; Breast cancer in her paternal grandmother; Dementia in her mother; Hyperlipidemia in her brother; Hypertension in her brother; Obesity in her brother; Skin cancer in her paternal grandmother; Stroke (age of onset: 13) in her mother.  ROS:   Please see the history of present illness.    ROS  All other systems reviewed and negative.   EKGs/Labs/Other Studies Reviewed:    The following studies were reviewed today: none  EKG:  EKG is  ordered today.  The ekg ordered today demonstrates sinus bradycardia at 55bpm with no ST changes  Recent Labs: 12/16/2018: ALT 9; BUN 14; Creatinine, Ser 1.14; Hemoglobin 13.2; Platelets 150; Potassium 4.7; Sodium 139; TSH 1.230   Recent Lipid Panel    Component Value Date/Time   CHOL 222 (H) 12/16/2018 1113   TRIG 124 12/16/2018 1113   HDL 56 12/16/2018 1113   CHOLHDL 4.0 12/16/2018 1113   CHOLHDL 4.2 02/26/2017 1126   VLDL 38 (H) 02/26/2017 1126   LDLCALC 141 (H) 12/16/2018 1113    Physical Exam:    VS:  BP 98/64   Pulse (!) 50   Ht 5' 6.5" (1.689 m)   Wt 194 lb 9.6 oz (88.3 kg)   LMP 06/28/2008   BMI 30.94 kg/m     Wt Readings from Last 3 Encounters:  05/12/19 194 lb 9.6 oz (88.3 kg)  01/12/19 194 lb (88 kg)  12/16/18 204 lb 9.6 oz (92.8 kg)     GEN:  Well nourished, well developed in no acute distress HEENT: Normal NECK: No JVD; No carotid bruits LYMPHATICS: No lymphadenopathy CARDIAC: RRR, no murmurs, rubs, gallops RESPIRATORY:  Clear to auscultation without rales, wheezing or rhonchi  ABDOMEN: Soft, non-tender, non-distended MUSCULOSKELETAL:  No edema; No deformity  SKIN: Warm and dry NEUROLOGIC:  Alert and  oriented x 3 PSYCHIATRIC:  Normal affect   ASSESSMENT:    1. SVT (supraventricular tachycardia) ([place])   2. PVC's (premature ventricular contractions)   3. Mild mitral regurgitation   4. Pure hypercholesterolemia    PLAN:    In order of problems listed above:  1.  SVT -no reoccurrence on Lopressor  2.  PVCs -complains of minimal palpitations at times but not bothersome -she had been on Toprol XL 25mg  daily and wanted to switch to generic but was placed on Lopressor tartrate 50mg  nightly.  Will change to metoprolol succinate 25mg  qhs.  3.  Mitral and aortic regurgitation -both mild by echo 08/2016 -repeat echo to make sure this is stable  4.  HLD -followed by PCP -LDL 141 in May -LDL goal < 130 -continue Zetia 10mg  daily - she is statin intolerant   Medication Adjustments/Labs and Tests Ordered: Current medicines are reviewed at length with the patient today.  Concerns regarding medicines are outlined above.  Orders Placed This Encounter  Procedures  . EKG 12-Lead   No orders of the defined types were placed in this encounter.   Signed, Fransico Him, MD  05/12/2019 2:31 PM    [place]

## 2019-05-12 NOTE — Patient Instructions (Addendum)
Medication Instructions:  Your physician has recommended you make the following change in your medication: STOP Metoprolol tartrate (Lopressor) START Metoprolol succinate (Toprol XL ) 25 mg once daily  *If you need a refill on your cardiac medications before your next appointment, please call your pharmacy*  Lab Work: None Ordered    Testing/Procedures: Your physician has requested that you have an echocardiogram in 6 months. Echocardiography is a painless test that uses sound waves to create images of your heart. It provides your doctor with information about the size and shape of your heart and how well your heart's chambers and valves are working. This procedure takes approximately one hour. There are no restrictions for this procedure.    Follow-Up: At [facility], you and your health needs are our priority.  As part of our continuing mission to provide you with exceptional heart care, we have created designated Provider Care Teams.  These Care Teams include your primary Cardiologist (physician) and Advanced Practice Providers (APPs -  Physician Assistants and Nurse Practitioners) who all work together to provide you with the care you need, when you need it.  Your next appointment:   12 months  The format for your next appointment:   In Person  Provider:   You may see Fransico Him, MD or one of the following Advanced Practice Providers on your designated Care Team:    Melina Copa, PA-C  Ermalinda Barrios, PA-C

## 2019-05-29 ENCOUNTER — Other Ambulatory Visit: Payer: Self-pay | Admitting: Psychiatry

## 2019-05-29 DIAGNOSIS — F3130 Bipolar disorder, current episode depressed, mild or moderate severity, unspecified: Secondary | ICD-10-CM

## 2019-06-10 ENCOUNTER — Other Ambulatory Visit: Payer: Self-pay

## 2019-06-10 ENCOUNTER — Telehealth: Payer: Self-pay | Admitting: Psychiatry

## 2019-06-10 MED ORDER — VENLAFAXINE HCL 75 MG PO TABS: 75.0000 mg | ORAL_TABLET | Freq: Every day | ORAL | 0 refills | Status: DC

## 2019-06-10 NOTE — Telephone Encounter (Signed)
Pt needs refill on Effexor 75mg  sent in to [facility] mail order.

## 2019-06-10 NOTE — Telephone Encounter (Signed)
90 day refill submitted to [facility] for Effexor 75 mg 1 daily Has follow up in December

## 2019-06-28 DIAGNOSIS — R350 Frequency of micturition: Secondary | ICD-10-CM | POA: Diagnosis not present

## 2019-06-28 DIAGNOSIS — R82998 Other abnormal findings in urine: Secondary | ICD-10-CM | POA: Diagnosis not present

## 2019-06-28 DIAGNOSIS — G35 Multiple sclerosis: Secondary | ICD-10-CM | POA: Diagnosis not present

## 2019-06-28 DIAGNOSIS — N3941 Urge incontinence: Secondary | ICD-10-CM | POA: Diagnosis not present

## 2019-06-28 DIAGNOSIS — R351 Nocturia: Secondary | ICD-10-CM | POA: Diagnosis not present

## 2019-06-28 DIAGNOSIS — N949 Unspecified condition associated with female genital organs and menstrual cycle: Secondary | ICD-10-CM | POA: Diagnosis not present

## 2019-06-28 DIAGNOSIS — R3915 Urgency of urination: Secondary | ICD-10-CM | POA: Diagnosis not present

## 2019-07-12 ENCOUNTER — Ambulatory Visit: Payer: Medicare PPO | Admitting: Psychiatry

## 2019-07-14 ENCOUNTER — Ambulatory Visit (INDEPENDENT_AMBULATORY_CARE_PROVIDER_SITE_OTHER): Payer: Medicare PPO | Admitting: Psychiatry

## 2019-07-14 ENCOUNTER — Other Ambulatory Visit: Payer: Self-pay

## 2019-07-14 ENCOUNTER — Encounter: Payer: Self-pay | Admitting: Psychiatry

## 2019-07-14 VITALS — Ht 66.0 in | Wt 196.0 lb

## 2019-07-14 DIAGNOSIS — F3181 Bipolar II disorder: Secondary | ICD-10-CM | POA: Diagnosis not present

## 2019-07-14 DIAGNOSIS — R399 Unspecified symptoms and signs involving the genitourinary system: Secondary | ICD-10-CM | POA: Diagnosis not present

## 2019-07-14 DIAGNOSIS — G4720 Circadian rhythm sleep disorder, unspecified type: Secondary | ICD-10-CM

## 2019-07-14 DIAGNOSIS — F411 Generalized anxiety disorder: Secondary | ICD-10-CM | POA: Diagnosis not present

## 2019-07-14 NOTE — Progress Notes (Signed)
[facility]  Patient ID: Sarah Hogan,  MRN: [id number]  PCP: Sarah Rm, NP-C  Date of Evaluation: 07/14/2019 Time spent:20 minutes 0940 to 1000  Chief Complaint:  Chief Complaint    Depression; Manic Behavior; Anxiety      HISTORY/CURRENT STATUS: Sarah Hogan is seen onsite in office 20 minutes face-to-face individually with consent with epic collateral for psychiatric interview and exam in 67-month evaluation and management of bipolar depression, generalized anxiety, circadian sleep disorder, and multiple comorbidities medically especially multiple sclerosis.  She missed her appointment earlier this week despite thinking about it for the preceding month as she has been having much emotional distress.  Despite being more depressed and anxious, she is currently declining to adjust her medications such as Geodon, Effexor, or Trileptal. She had urinary incontinence twice in the last 2 days needing a urine culture or possibly just more of her MS treatment.  She has significant psychosocial stresses most profound of which is having sent money at the request to boyfriend Sarah Hogan in [place] who continues to ask for more money and not coming home or meeting his promises and responsibilities.  She has filed an [facility] claim for fraud by Sarah Hogan and will not give him any more money, though she is now low on her financial resources for making it until she moves into a long-term care arrangement when she is 58 years of age.  She has 1 friend who understands the situation but cannot talk to relatives about such.  Relatives are not having the usual Christmas Eve activity due to Covid though she does have a good community where she lives for being able to still not be shutdown so much.  However, her church is shutdown, and she is having to read and pray in order to cope but feels that the Reita Cliche will give her an answer even though it may be a painful answer.  She is eating more sugar and snacks and less  appropriate food.  She cannot afford to get any of her medications until after first of the year and will need Topamax and Provigil first then though she has been using little Provigil except recently for an evening musical [employer] performance of the [employer] orchestra on line provided by a friend.  Her older brother in [place] does not have any faith in her while her younger brother Sarah Hogan will have her over for Christmas eve but she will be alone on Christmas day.  She is not manic, psychotic, delirious, demented, or suicidal.  She is still wearing her Christmas clothing in celebration and has reasonable hygiene taking care of the dog who is having a rough season thus far.  Her house is a mess as she gets too much or too little sleep.  Depression The patient presents withexacerbation of recurrentdepression without current hypomanic episode episode for 1 to 2 months associated with situational triggers. The onset quality is sudden. The problem occurs intermittently.The problem has been gradually improvingsince onset.Associated symptoms include somatic and cognitive anxiety, increased appetite for sugar, worry and doubt, insomnia,irritable,restlessness, decreased concentration,decreased interest,headachesand agitated. Associated symptoms includeno motivational change,no fatigue,no helplessness, nohopelessness,no body achesand no suicidal ideas.The symptoms are aggravated by social issues and family issues.Past treatments include SSRIs - Selective serotonin reuptake inhibitors, SNRIs - Serotonin and norepinephrine reuptake inhibitors, other medications and psychotherapy.Compliance with treatment is good.Past compliance problems include medical issues, medication issues, difficulty with treatment plan and pharmacy issues.Previous treatment provided moderaterelief.Risk factors include a change in medication usage/dosage, family history,  family history  of mental illness, history of mental illness, menopause, major life event, a recent illness and stress. Past medical history includes chronic illness,recent illness,physical disability,brain trauma,anxiety,bipolar disorder,depression,mental health disorderand obsessive-compulsive disorder. Pertinent negatives include no life-threatening condition,no eating disorder,no post-traumatic stress disorder,no schizophrenia,no suicide attemptsand no head trauma.  Individual Medical History/ Review of Systems: Changes? :Yes In the interim cardiology changing her beta-blocker to metoprolol 25 mg nightly history of SVT and PVCs planning a repeat echo but with mitral and aortic regurgitation clinically mild following LDL cholesterol 141 in May with goal less than 130 on Zetia 10 mg daily.  She has urine culture pending by urology for the incontinence.  She saw neurology 04/22/2019 monitoring brain MRI without contrast continuing Lyrica and baclofen follow-up in 6 months off the [place] since December 2018.  Allergies: Amoxicillin  Current Medications:  Current Outpatient Medications:  .  baclofen (LIORESAL) 10 MG tablet, Take 10 mg by mouth 2 (two) times daily. , Disp: , Rfl:  .  busPIRone (BUSPAR) 10 MG tablet, TAKE 1 TABLET TWICE DAILY, Disp: 180 tablet, Rfl: 0 .  clonazePAM (KLONOPIN) 0.5 MG tablet, Take 1 tablet (0.5 mg total) by mouth 2 (two) times daily as needed for anxiety., Disp: 30 tablet, Rfl: 1 .  ezetimibe (ZETIA) 10 MG tablet, Take 1 tablet by mouth daily, Disp: 90 tablet, Rfl: 0 .  metoprolol succinate (TOPROL XL) 25 MG 24 hr tablet, Take 1 tablet (25 mg total) by mouth daily., Disp: 90 tablet, Rfl: 3 .  MYRBETRIQ 25 MG TB24 tablet, Take 25 mg by mouth daily., Disp: , Rfl: 3 .  OXcarbazepine (TRILEPTAL) 150 MG tablet, Take 1 tablet (150 mg total) by mouth 2 (two) times daily., Disp: 270 tablet, Rfl: 0 .  pregabalin (LYRICA) 50 MG capsule, Take 50 mg by mouth daily., Disp: , Rfl:   .  Progesterone Micronized (PROGESTERONE PO), Take 50 mg by mouth daily. Takes 100mg  daily. Bio- Identical Hormones, Disp: , Rfl:  .  topiramate (TOPAMAX) 50 MG tablet, Take 0.5 tablets (25 mg total) by mouth at bedtime., Disp: 90 tablet, Rfl: 0 .  traZODone (DESYREL) 50 MG tablet, Take 50 mg by mouth at bedtime as needed., Disp: , Rfl:  .  venlafaxine (EFFEXOR) 75 MG tablet, Take 1 tablet (75 mg total) by mouth daily., Disp: 90 tablet, Rfl: 0 .  ziprasidone (GEODON) 20 MG capsule, TAKE 1 CAPSULE AT BEDTIME, Disp: 90 capsule, Rfl: 0  Medication Side Effects: none  Family Medical/ Social History: Changes? No  MENTAL HEALTH EXAM:  Height 5\' 6"  (1.676 m), weight 196 lb (88.9 kg), last menstrual period 06/28/2008.Body mass index is 31.64 kg/m.  AIMS = 0.  Otherwise deferred for coronavirus shutdown  General Appearance: Casual, Fairly Groomed, Guarded and Meticulous  Eye Contact:  Good  Speech:  Clear and Coherent, Normal Rate and Talkative  Volume:  Normal  Mood:  Anxious, Depressed, Dysphoric, Irritable and Worthless  Affect:  Congruent, Depressed, Labile, Restricted and Anxious  Thought Process:  Coherent, Goal Directed, Irrelevant and Descriptions of Associations: Tangential  Orientation:  Full (Time, Place, and Person)  Thought Content: Ilusions, Rumination and Tangential   Suicidal Thoughts:  No  Homicidal Thoughts:  No  Memory:  Immediate;   Good and Fair Remote;   Good and Fair  Judgement:  Impaired  Insight:  Fair and Lacking  Psychomotor Activity:  Normal, Increased, Decreased, Mannerisms, Psychomotor Retardation and Restlessness  Concentration:  Concentration: Fair and Attention Span: Fair  Recall:  [employer] of Knowledge: Good  Language: Good  Assets:  Desire for Improvement Social Support Talents/Skills  ADL's:  Intact  Cognition: WNL  Prognosis:  Poor    DIAGNOSES:    ICD-10-CM   1. Bipolar II disorder, moderate, depressed, with anxious distress ([place])  F31.81    2. Generalized anxiety disorder  F41.1   3. Circadian rhythm sleep disorder, unspecified type  G47.20     Receiving Psychotherapy: No    RECOMMENDATIONS: Psycho supportive psychoeducation integrates exposure desensitization thought stopping response prevention cognitive behavioral nutrition, sleep hygiene, object relations and frustration management interventions with symptom treatment matching having many medications declining adjustment of these due to the situational triggers for her relative decompensation.  Estimates can be made in Geodon, Effexor, or Trileptal as needed in the next weeks but patient expects her prior and the port system to pull her through the many difficulties with finances and boyfriend.  He has current supply of medications but distressed by the new year coming and her medication budget but likely to need refills fairly soon.She continues Effexor 75 mg XR every morning for anxiety and depression including postmenopausal, Geodon 20 mg every bedtime for bipolar, Trileptal 150 mg twice daily for bipolar, BuSpar 10 mg twice daily for generalized anxiety but taking usually only 1 in the evening, Klonopin 0.5 mg twice daily as needed for generalized anxiety, trazodone 50 mg at bedtime as needed for insomnia, Topamax 50 mg taking 1/2 tablet to 1 tablet at bedtime for mood and somatic anxiety, and Provigil 100 mg every morning and daily as needed last prescribed by neurology having current supply of all but soon to exhaust the Provigil and Topamax.  He returns for follow-up in 2 months or sooner if needed.   Delight Hoh, MD

## 2019-07-27 ENCOUNTER — Other Ambulatory Visit: Payer: Self-pay | Admitting: Family Medicine

## 2019-07-27 ENCOUNTER — Other Ambulatory Visit: Payer: Self-pay | Admitting: Psychiatry

## 2019-07-27 DIAGNOSIS — F411 Generalized anxiety disorder: Secondary | ICD-10-CM

## 2019-07-27 NOTE — Telephone Encounter (Signed)
Due in may for annual

## 2019-07-28 NOTE — Telephone Encounter (Signed)
Next apt 09/14/2019

## 2019-07-28 NOTE — Telephone Encounter (Signed)
Apt 02/23

## 2019-07-28 NOTE — Telephone Encounter (Signed)
As at last appointment patient predicted need for refills immediately after first of the year, modafinil is E scribed to [facility] on [address] as 100 mg every morning #30 with 3 refills for fatigue and executive function problems of MS as well as sleep phase disorder.  She is also E scribed to [facility] on [address] trazodone 50 mg nightly as needed for insomnia and Klonopin 0.5 mg daily as needed for anxiety as a month supply each and no refill.

## 2019-08-05 DIAGNOSIS — N3941 Urge incontinence: Secondary | ICD-10-CM | POA: Diagnosis not present

## 2019-08-05 DIAGNOSIS — N3281 Overactive bladder: Secondary | ICD-10-CM | POA: Diagnosis not present

## 2019-08-24 ENCOUNTER — Other Ambulatory Visit: Payer: Self-pay | Admitting: Psychiatry

## 2019-08-24 DIAGNOSIS — F3181 Bipolar II disorder: Secondary | ICD-10-CM

## 2019-08-24 NOTE — Telephone Encounter (Signed)
eScription for oxcarbazepine 150 mg taking 1.5 tablets total 225 mg twice daily though episodically reducing to 1 tablet twice daily when possible sending #[phone] with no refill to [facility]

## 2019-08-24 NOTE — Telephone Encounter (Signed)
Please clarify dose, 1 1/2 tablets bid?

## 2019-08-24 NOTE — Telephone Encounter (Signed)
Dosing of the Trileptal has to be titrated to manic symptoms last needed as of late October for the 1.5 tablets of the 150 mg each twice daily then reduced to 150 mg twice daily but now having major stressors anticipating possible need to restore to the 1.5 tablets twice daily.

## 2019-09-14 ENCOUNTER — Encounter: Payer: Self-pay | Admitting: Psychiatry

## 2019-09-14 ENCOUNTER — Ambulatory Visit (INDEPENDENT_AMBULATORY_CARE_PROVIDER_SITE_OTHER): Payer: Medicare PPO | Admitting: Psychiatry

## 2019-09-14 ENCOUNTER — Other Ambulatory Visit: Payer: Self-pay

## 2019-09-14 VITALS — Ht 66.0 in | Wt 195.0 lb

## 2019-09-14 DIAGNOSIS — Z0289 Encounter for other administrative examinations: Secondary | ICD-10-CM

## 2019-09-14 DIAGNOSIS — F3181 Bipolar II disorder: Secondary | ICD-10-CM | POA: Diagnosis not present

## 2019-09-14 DIAGNOSIS — F411 Generalized anxiety disorder: Secondary | ICD-10-CM

## 2019-09-14 DIAGNOSIS — G4720 Circadian rhythm sleep disorder, unspecified type: Secondary | ICD-10-CM

## 2019-09-14 DIAGNOSIS — G35 Multiple sclerosis: Secondary | ICD-10-CM

## 2019-09-14 NOTE — Progress Notes (Signed)
[facility]  Patient ID: Sarah Hogan,  MRN: [id number]  PCP: Girtha Rm, NP-C  Date of Evaluation: 09/14/2019 Time spent:20 minutes from 1125 to 1145  Chief Complaint:  Chief Complaint    Depression; Manic Behavior; Anxiety; Stress      HISTORY/CURRENT STATUS: Sarah Hogan is seen on site in office 20 minutes face-to-face individually with consent with epic collateral for psychiatric interview and exam in 4-month evaluation and management of bipolar depressed, generalized anxiety, circadian rhythm sleep disorder, and multisystemic consequences of multiple sclerosis and its treatment.  With neurogenic bladder, turning 59 years this July, and years of requiring multiple medications for treatment, she worries about renal insults.  Mother had bipolar disorder as does the patient, and the mainstay of her mood regulation is accomplished with Geodon, Effexor and Trileptal although she requires BuSpar  when anxiety becomes cognitive projections of catastrophic injury. When sleep is impaired to the point of neurological consequence, she takes Restoril or Klonopin for sleep as well as requiring Provigil in the day when sleep deprived or off schedule as well as for concentration and motivation when MS and depression combined in undermining her function.  Driving in traffic is her most difficult task for generalized anxiety.  Brother in [place] has had knee surgery, and a great nephew visited a few weeks ago.  Boyfriend Ludwig Clarks is now in [place] away from [place] but is still promising her that he will prove himself worthy of her love now wanting her to invest in [place] possibly initially by laundering so that she has remained in contact with the FBI but is depressed over missing him as he contacts her approximately every 2 weeks.  She is attempting to utilize the [employer] for overcoming her credit card debt associated with Ludwig Clarks as she had a previous program  through her church.  Therefore many stressors are overwhelming including the protracted winter and need for warm weather as she had in [place] she still misses.  She still cannot attend church as they will not wear masks therefore most of her support must be either in her building complex or online.  Her friend's breast cancer chemotherapy ends before the patient's birthday in July.  She is depressed today but not manic, psychotic, delirious, or suicidal.  Depression The patient presents withexacerbation of recurrentdepression without current hypomania now for 4 months also associated with situational triggers. The onset quality is sudden. The problem occurs intermittently.The problem has been gradually improvingsince onset.Associated symptoms include somatic and cognitive anxiety, increased appetite for sugar, worry and doubt, insomnia,irritable,restlessness,fatigue, decreased concentration,decreased interest,headaches, agitated dysphoria, and hopeless guilt. Associated symptoms includeno motivational change,nohelplessness,no body achesand no suicidal ideas.The symptoms are aggravated by social issues and family issues.Past treatments include SSRIs - Selective serotonin reuptake inhibitors, SNRIs - Serotonin and norepinephrine reuptake inhibitors, other medications and psychotherapy.Compliance with treatment is good.Past compliance problems include medical issues, medication issues, difficulty with treatment plan and pharmacy issues.Previous treatment provided moderaterelief.Risk factors include a change in medication usage/dosage, family history, family history of mental illness, history of mental illness, menopause, major life event, a recent illness and stress. Past medical history includes chronic illness,recent illness,physical disability,brain trauma,anxiety,bipolar disorder,depression,mental health disorderand obsessive-compulsive disorder.  Pertinent negatives include no life-threatening condition,no eating disorder,no post-traumatic stress disorder,no schizophrenia,no suicide attemptsand no head trauma.  Individual Medical History/ Review of Systems: Changes? :Yes  during 2-1/2 years of care here, maximum weight has been 224 pounds gaining 4 pounds to 200 over the holiday now  back to 195.  He has been attempting to reduce her cost of daily living and medical care with her current credit card and other debt.  Allergies: Amoxicillin  Current Medications:  Current Outpatient Medications:  .  baclofen (LIORESAL) 10 MG tablet, Take 10 mg by mouth 2 (two) times daily. , Disp: , Rfl:  .  busPIRone (BUSPAR) 10 MG tablet, TAKE 1 TABLET TWICE DAILY, Disp: 180 tablet, Rfl: 0 .  clonazePAM (KLONOPIN) 0.5 MG tablet, Take 1 tablet (0.5 mg total) by mouth daily as needed for anxiety., Disp: 30 tablet, Rfl: 0 .  ezetimibe (ZETIA) 10 MG tablet, Take 1 tablet by mouth once daily, Disp: 90 tablet, Rfl: 1 .  metoprolol succinate (TOPROL XL) 25 MG 24 hr tablet, Take 1 tablet (25 mg total) by mouth daily., Disp: 90 tablet, Rfl: 3 .  modafinil (PROVIGIL) 100 MG tablet, TAKE ONE TABLET BY MOUTH EVERY MORNING, Disp: 30 tablet, Rfl: 3 .  MYRBETRIQ 25 MG TB24 tablet, Take 25 mg by mouth daily., Disp: , Rfl: 3 .  OXcarbazepine (TRILEPTAL) 150 MG tablet, TAKE 1 AND 1/2 TABLETS TWICE DAILY, Disp: 270 tablet, Rfl: 0 .  pregabalin (LYRICA) 50 MG capsule, Take 50 mg by mouth daily., Disp: , Rfl:  .  Progesterone Micronized (PROGESTERONE PO), Take 50 mg by mouth daily. Takes 100mg  daily. Bio- Identical Hormones, Disp: , Rfl:  .  topiramate (TOPAMAX) 50 MG tablet, Take 0.5 tablets (25 mg total) by mouth at bedtime., Disp: 90 tablet, Rfl: 0 .  traZODone (DESYREL) 50 MG tablet, TAKE 1 TABLET BY MOUTH ONCE DAILY AT BEDTIME AS NEEDED FOR  INSOMNIA, Disp: 30 tablet, Rfl: 0 .  venlafaxine (EFFEXOR) 75 MG tablet, Take 1 tablet (75 mg total) by mouth daily., Disp:  90 tablet, Rfl: 0 .  ziprasidone (GEODON) 20 MG capsule, TAKE 1 CAPSULE AT BEDTIME, Disp: 90 capsule, Rfl: 0   Medication Side Effects: fatigue/weakness, hypersomnolence and insomnia  Family Medical/ Social History: Changes?  List support from family during [place] but neighbors are helping clean her house and keep her supplied of basic needs.  MENTAL HEALTH EXAM:  Height 5\' 6"  (1.676 m), weight 195 lb (88.5 kg), last menstrual period 06/28/2008.Body mass index is 31.47 kg/m. Muscle strengths and tone 5/5, postural reflexes and gait 0/0, and AIMS = 0 otherwise deferred for coronavirus shutdown  General Appearance: Disheveled, Guarded, Meticulous and Obese  Eye Contact:  Fair  Speech:  Clear and Coherent, Normal Rate and Talkative  Volume:  Normal  Mood:  Anxious, Depressed, Dysphoric, Hopeless and Worthless  Affect:  Congruent, Depressed, Inappropriate, Restricted and Anxious  Thought Process:  Coherent, Irrelevant, Linear and Descriptions of Associations: Tangential  Orientation:  Full (Time, Place, and Person)  Thought Content: Rumination and Tangential   Suicidal Thoughts:  No  Homicidal Thoughts:  No  Memory:  Immediate;   Fair Remote;   Fair  Judgement:  Fair  Insight:  Fair  Psychomotor Activity:  Normal, Decreased, Mannerisms and Psychomotor Retardation  Concentration:  Concentration: Fair and Attention Span: Poor  Recall:  [employer] of Knowledge: Good  Language: Good  Assets:  Desire for Improvement Resilience Talents/Skills  ADL's:  Intact  Cognition: WNL  Prognosis:  Poor    DIAGNOSES:    ICD-10-CM   1. Bipolar II disorder, moderate, depressed, with anxious distress ([place])  F31.81   2. Generalized anxiety disorder  F41.1   3. Circadian rhythm sleep disorder, unspecified type  G47.20   4. Multiple  sclerosis ([place])  G35     Receiving Psychotherapy: No    RECOMMENDATIONS: Effexor has been changed last November from XR capsule to 75 mg IR tablet assessing whether  anxiety can be better contained in the day that way with less overall SNRI burden to mood stabilization considering the bipolar disorder but still allowing some improvement in depression. Geodon is given with food after the evening meal 20 mg nightly and Trileptal is now 150 mg twice daily having supply with [employer] of all 3 of these medications for Bipolar II and generalized anxiety. BuSpar 10 mg up to twice daily as currently held for treatment of depression as anxiety is less not driving much. Modafinil 100 mg every morning and Klonopin 0.5 mg daily usually at night if needed for insomnia and anxiety or trazodone 50 mg nightly for insomnia provide stabilization of consequences of circadian sleep disorder and attempt to intensify resources available to diurnal useful activity as possible considering MS and depression. Topamax is currently one half of the 50 mg tablet nightly for migraine and impulse dyscontrol. The other medications are primarily directed by neurology including Lyrica, Toprol, Lioresal, Myrbetrig for bladder, Zetia for cholesterol, and and progesterone by GYN and neurology for menopausal impact. Over 50% of the 20-minute face-to-face time for total of 10 minutes is spent in counseling and coordination of care particularly for grief and loss, trauma focused cognitive behavioral, and interpersonal protection, prevention, and problem solving for her current dilemmas. She returns in 2 months for follow-up or sooner if needed.  Delight Hoh, MD

## 2019-09-24 ENCOUNTER — Other Ambulatory Visit: Payer: Self-pay | Admitting: Psychiatry

## 2019-10-13 ENCOUNTER — Other Ambulatory Visit: Payer: Self-pay | Admitting: Psychiatry

## 2019-10-13 DIAGNOSIS — F3130 Bipolar disorder, current episode depressed, mild or moderate severity, unspecified: Secondary | ICD-10-CM

## 2019-10-25 ENCOUNTER — Other Ambulatory Visit: Payer: Self-pay | Admitting: Obstetrics & Gynecology

## 2019-10-25 DIAGNOSIS — Z1231 Encounter for screening mammogram for malignant neoplasm of breast: Secondary | ICD-10-CM

## 2019-10-26 DIAGNOSIS — G35 Multiple sclerosis: Secondary | ICD-10-CM | POA: Diagnosis not present

## 2019-10-26 DIAGNOSIS — Z79899 Other long term (current) drug therapy: Secondary | ICD-10-CM | POA: Diagnosis not present

## 2019-11-02 ENCOUNTER — Other Ambulatory Visit: Payer: Self-pay

## 2019-11-02 ENCOUNTER — Ambulatory Visit (HOSPITAL_COMMUNITY): Payer: Medicare PPO | Attending: Internal Medicine

## 2019-11-02 DIAGNOSIS — I340 Nonrheumatic mitral (valve) insufficiency: Secondary | ICD-10-CM

## 2019-11-02 DIAGNOSIS — I351 Nonrheumatic aortic (valve) insufficiency: Secondary | ICD-10-CM

## 2019-11-15 ENCOUNTER — Ambulatory Visit (INDEPENDENT_AMBULATORY_CARE_PROVIDER_SITE_OTHER): Payer: Medicare PPO | Admitting: Psychiatry

## 2019-11-15 ENCOUNTER — Other Ambulatory Visit: Payer: Self-pay

## 2019-11-15 ENCOUNTER — Encounter: Payer: Self-pay | Admitting: Psychiatry

## 2019-11-15 VITALS — Ht 66.0 in | Wt 198.0 lb

## 2019-11-15 DIAGNOSIS — F411 Generalized anxiety disorder: Secondary | ICD-10-CM | POA: Diagnosis not present

## 2019-11-15 DIAGNOSIS — G4720 Circadian rhythm sleep disorder, unspecified type: Secondary | ICD-10-CM | POA: Diagnosis not present

## 2019-11-15 DIAGNOSIS — F3181 Bipolar II disorder: Secondary | ICD-10-CM

## 2019-11-15 DIAGNOSIS — G35 Multiple sclerosis: Secondary | ICD-10-CM | POA: Diagnosis not present

## 2019-11-15 NOTE — Progress Notes (Signed)
[facility]  Patient ID: Sarah Hogan,  MRN: [id number]  PCP: Girtha Rm, NP-C  Date of Evaluation: 11/15/2019 Time spent:25 minutes  From 1100 to 1125 Chief Complaint:  Chief Complaint    Depression; Fatigue; Anxiety      HISTORY/CURRENT STATUS: Sarah Hogan is seen onsite in office 25 minutes face-to-face individually with consent with epic collateral for psychiatric interview and exam in 64-month evaluation and management of bipolar II depressed, generalized anxiety, circadian rhythm sleep disorder, and mutiple sclerosis.  Patient did see her neurologist by telemedicine video for the first time instead of audio as in the past on 10/26/2019.  Patient's 59th birthday approaches expected to have a single day instead of a month to celebrate as she did in [place].  She has not heard from [place] now in [place] for 6 weeks doubting he will ever replace the over $30,000 she has given him so that she now feels resigned to selling her townhouse and entering [place] with what money she has left.  She has been attempting to pay off credit card debt and rearrange finances as possible.  She is ambivalent about more medication treatment with cost and episodic help so that after 15 years she doubts an adjustment in Effexor could help when it always has.  She gradually works through in the session all of these matters for understanding her current distress and next steps for relief of what she considers a situational depression rather than a relapse of her bipolar disorder.  She would consider returning to therapy and restarting her modafinil last dispensed in January though she has not taken any of that supply.  She mostly sits and reads all day getting little done though she has had spurts in the last few days washing all of her clothes, taking care of her animals, and preparing for the spring even as this appointment approached.  She does not mention incontinence or other MS concerns currently.  She  considered reducing her Trileptal but considers that she absolutely needs to Geodon as she decompensates without the Geodon.  However reducing the Trileptal did not help.  She gradually agrees that increasing the Effexor tablet before having up to start all over with a new medication is warranted particularly at her menopausal time.  She is not manic, psychotic, delirious, or suicidal today.  Depression The patient presents withsituational exacerbation ofrecurrentdepression withouthypomania associated with situational triggers. The onset quality is sudden. The problem occurs intermittently.The problem has been gradually improvingsince onset.Associated symptoms includesomatic and cognitive anxiety,increased appetite for sugar,worry and doubt,hypersomnia,irritability,apathy,fatigue, decreased concentration,decreased interest,headaches, agitated dysphoria, and hopeless guilt. Associated symptoms includenomotivationalchange,nohelplessness,no body aches, no restlessness,and no suicidal ideas.The symptoms are aggravated by social issues and family issues.Past treatments include SSRIs - Selective serotonin reuptake inhibitors, SNRIs - Serotonin and norepinephrine reuptake inhibitors, other medications and psychotherapy.Compliance with treatment is good.Past compliance problems include medical issues, medication issues, difficulty with treatment plan and pharmacy issues.Previous treatment provided moderaterelief.Risk factors include a change in medication usage/dosage, family history, family history of mental illness, history of mental illness, menopause, major life event, a recent illness and stress. Past medical history includes chronic illness,recent illness,physical disability,brain trauma,anxiety,bipolar disorder,depression,mental health disorderand obsessive-compulsive disorder. Pertinent negatives include no life-threatening condition,no  eating disorder,no post-traumatic stress disorder,no schizophrenia,no suicide attemptsand no head trauma.  Individual Medical History/ Review of Systems: Changes? :Yes   She does not mention incontinence or other MS concerns currently seeing neurologist by telemedicine video for the first time instead of audio as in the past  on 10/26/2019.  She is gained 3 pounds suggesting she is eating the wrong things especially sugar when she knows better.  Allergies: Amoxicillin  Current Medications:  Current Outpatient Medications:  .  baclofen (LIORESAL) 10 MG tablet, Take 10 mg by mouth 2 (two) times daily. , Disp: , Rfl:  .  busPIRone (BUSPAR) 10 MG tablet, TAKE 1 TABLET TWICE DAILY, Disp: 180 tablet, Rfl: 0 .  clonazePAM (KLONOPIN) 0.5 MG tablet, Take 1 tablet (0.5 mg total) by mouth daily as needed for anxiety., Disp: 30 tablet, Rfl: 0 .  ezetimibe (ZETIA) 10 MG tablet, Take 1 tablet by mouth once daily, Disp: 90 tablet, Rfl: 1 .  metoprolol succinate (TOPROL XL) 25 MG 24 hr tablet, Take 1 tablet (25 mg total) by mouth daily., Disp: 90 tablet, Rfl: 3 .  modafinil (PROVIGIL) 100 MG tablet, TAKE ONE TABLET BY MOUTH EVERY MORNING, Disp: 30 tablet, Rfl: 3 .  MYRBETRIQ 25 MG TB24 tablet, Take 25 mg by mouth daily., Disp: , Rfl: 3 .  OXcarbazepine (TRILEPTAL) 150 MG tablet, TAKE 1 AND 1/2 TABLETS TWICE DAILY, Disp: 270 tablet, Rfl: 0 .  pregabalin (LYRICA) 50 MG capsule, Take 50 mg by mouth daily., Disp: , Rfl:  .  Progesterone Micronized (PROGESTERONE PO), Take 50 mg by mouth daily. Takes 100mg  daily. Bio- Identical Hormones, Disp: , Rfl:  .  topiramate (TOPAMAX) 50 MG tablet, Take 0.5 tablets (25 mg total) by mouth at bedtime., Disp: 90 tablet, Rfl: 0 .  traZODone (DESYREL) 50 MG tablet, TAKE 1 TABLET BY MOUTH ONCE DAILY AT BEDTIME AS NEEDED FOR  INSOMNIA, Disp: 30 tablet, Rfl: 0 .  venlafaxine (EFFEXOR) 75 MG tablet, Take one tablet total 75 mg every breakfast and 1/2 tablet total 37.5 mg at supper,  Disp: 90 tablet, Rfl: 0 .  ziprasidone (GEODON) 20 MG capsule, TAKE 1 CAPSULE AT BEDTIME, Disp: 90 capsule, Rfl: 0   Medication Side Effects: none  Family Medical/ Social History: Changes? No  MENTAL HEALTH EXAM:  Height 5\' 6"  (1.676 m), weight 198 lb (89.8 kg), last menstrual period 06/28/2008.Body mass index is 31.96 kg/m. Muscle strengths and tone 5/5, postural reflexes and gait 0/0, and AIMS = 0 otherwise deferred for coronavirus shutdown  General Appearance: Casual, Disheveled, Meticulous and Obese  Eye Contact:  Good  Speech:  Clear and Coherent, Normal Rate and Talkative  Volume:  Normal  Mood:  Anxious, Depressed, Dysphoric, Euthymic, Hopeless and Irritable  Affect:  Congruent, Depressed, Inappropriate, Labile, Restricted and Anxious  Thought Process:  Coherent, Goal Directed, Irrelevant and Descriptions of Associations: Tangential  Orientation:  Full (Time, Place, and Person)  Thought Content: Ilusions, Rumination and Tangential   Suicidal Thoughts:  No  Homicidal Thoughts:  No  Memory:  Immediate;   Good to fair Remote;   Good  Judgement:  Fair  Insight:  Fair  Psychomotor Activity:  Decreased, Mannerisms and Psychomotor Retardation  Concentration:  Concentration: Fair and Attention Span: Fair  Recall:  [employer] of Knowledge: Good  Language: Good  Assets:  Leisure Time Resilience Social Support Talents/Skills  ADL's:  Intact  Cognition: WNL  Prognosis:  Poor    DIAGNOSES:    ICD-10-CM   1. Bipolar II disorder, moderate, depressed, with anxious distress (HCC)  F31.81 venlafaxine (EFFEXOR) 75 MG tablet  2. Generalized anxiety disorder  F41.1 venlafaxine (EFFEXOR) 75 MG tablet  3. Circadian rhythm sleep disorder, unspecified type  G47.20   4. MS (multiple sclerosis) ([place])  G35  Receiving Psychotherapy: No    RECOMMENDATIONS: She is willing to start therapy again and allows clarification of group therapies of pragmatic importance for her situational  triggers of depressive reaction at [facility] mental health of no charge.  Planning for birthday is addressed today and she agrees to return in 1 month regarding hopefully beneficial outcome of her initiatives from the session today.  She gradually agrees to increase Effexor by half tablet having the 75 mg IR tablet every morning to add 1/2 tablet in the evening at supper explaining the menopausal, focus and motivation, and depression relief possible with the Effexor.  She will also restart her modafinil 100 mg as 1/2 tablet every morning for wakeful alertness and animation.  She is current supply of all medications continuing Klonopin 0.5 mg often nightly and as needed for anxiety, trazodone 50 mg nightly if needed for insomnia, Geodon 20 mg after evening meal, Trileptal 225 mg twice daily that she will consider reducing, Topamax 25 mg nightly for migraine, and BuSpar 10 mg twice daily that she usually takes at night but can add the morning dose for daytime anxiety.  She most currently relinquish the frequent thoughts of [place] in [place] and residence in [place] 3 years ago to restore the benefits of her life here in [place].  She returns in 4 weeks for follow-up or sooner if needed.   Delight Hoh, MD

## 2019-11-22 ENCOUNTER — Other Ambulatory Visit: Payer: Self-pay

## 2019-11-22 ENCOUNTER — Ambulatory Visit
Admission: RE | Admit: 2019-11-22 | Discharge: 2019-11-22 | Disposition: A | Discharge status: Home / Self Care | Payer: Medicare PPO | Source: Ambulatory Visit | Attending: Obstetrics & Gynecology | Admitting: Obstetrics & Gynecology

## 2019-11-22 DIAGNOSIS — Z1231 Encounter for screening mammogram for malignant neoplasm of breast: Secondary | ICD-10-CM | POA: Diagnosis not present

## 2019-11-24 ENCOUNTER — Other Ambulatory Visit: Payer: Self-pay | Admitting: Obstetrics & Gynecology

## 2019-11-24 DIAGNOSIS — R928 Other abnormal and inconclusive findings on diagnostic imaging of breast: Secondary | ICD-10-CM

## 2019-12-06 ENCOUNTER — Ambulatory Visit
Admission: RE | Admit: 2019-12-06 | Discharge: 2019-12-06 | Disposition: A | Discharge status: Home / Self Care | Payer: Medicare PPO | Source: Ambulatory Visit | Attending: Obstetrics & Gynecology | Admitting: Obstetrics & Gynecology

## 2019-12-06 ENCOUNTER — Other Ambulatory Visit: Payer: Self-pay

## 2019-12-06 ENCOUNTER — Ambulatory Visit: Payer: Medicare PPO

## 2019-12-06 DIAGNOSIS — R922 Inconclusive mammogram: Secondary | ICD-10-CM | POA: Diagnosis not present

## 2019-12-06 DIAGNOSIS — R928 Other abnormal and inconclusive findings on diagnostic imaging of breast: Secondary | ICD-10-CM

## 2019-12-13 ENCOUNTER — Encounter: Payer: Self-pay | Admitting: Psychiatry

## 2019-12-13 ENCOUNTER — Other Ambulatory Visit: Payer: Self-pay

## 2019-12-13 ENCOUNTER — Ambulatory Visit (INDEPENDENT_AMBULATORY_CARE_PROVIDER_SITE_OTHER): Payer: Medicare PPO | Admitting: Psychiatry

## 2019-12-13 ENCOUNTER — Encounter: Payer: Self-pay | Admitting: Family Medicine

## 2019-12-13 ENCOUNTER — Ambulatory Visit: Payer: Medicare PPO | Admitting: Family Medicine

## 2019-12-13 VITALS — Temp 97.8°F | Wt 202.2 lb

## 2019-12-13 VITALS — Ht 66.0 in | Wt 200.0 lb

## 2019-12-13 DIAGNOSIS — G4720 Circadian rhythm sleep disorder, unspecified type: Secondary | ICD-10-CM | POA: Diagnosis not present

## 2019-12-13 DIAGNOSIS — W57XXXA Bitten or stung by nonvenomous insect and other nonvenomous arthropods, initial encounter: Secondary | ICD-10-CM | POA: Diagnosis not present

## 2019-12-13 DIAGNOSIS — R42 Dizziness and giddiness: Secondary | ICD-10-CM | POA: Diagnosis not present

## 2019-12-13 DIAGNOSIS — G35 Multiple sclerosis: Secondary | ICD-10-CM

## 2019-12-13 DIAGNOSIS — R7989 Other specified abnormal findings of blood chemistry: Secondary | ICD-10-CM | POA: Diagnosis not present

## 2019-12-13 DIAGNOSIS — F3181 Bipolar II disorder: Secondary | ICD-10-CM

## 2019-12-13 DIAGNOSIS — F411 Generalized anxiety disorder: Secondary | ICD-10-CM | POA: Diagnosis not present

## 2019-12-13 DIAGNOSIS — S30860A Insect bite (nonvenomous) of lower back and pelvis, initial encounter: Secondary | ICD-10-CM

## 2019-12-13 MED ORDER — VENLAFAXINE HCL ER 75 MG PO CP24: 75.0000 mg | ORAL_CAPSULE | Freq: Every day | ORAL | 1 refills | Status: DC

## 2019-12-13 NOTE — Progress Notes (Signed)
[facility]  Patient ID: Sarah Hogan,  MRN: [id number]  PCP: Girtha Rm, NP-C  Date of Evaluation: 12/13/2019 Time spent:20 minutes from 1050 to 1110  Chief Complaint:  Chief Complaint    Depression; Manic Behavior; Anxiety      HISTORY/CURRENT STATUS: Sarah Hogan is seen onsite in office 20 minutes face-to-face individually with consent with epic collateral for psychiatric interview and exam in 4-week evaluation and management of bipolar II currently depressed, generalized anxiety, circadian rhythm sleep disorder, and comorbid multiple sclerosis.  Patient limits her focus of all to reporting that May 23 was a good day until dryer malfunction needing homeowner's repairman coming out when she still worries for virus.  However, concentration was not good enough in the morning to read her Bible without modafanil self that interferes with her sleep. She interpreted that she was better by the afternoon.  She does find that taking Effexor in higher dose is helpful but she cannot readily split half of the tablet taking 1 1/2 of the 37.5 mg tabs in the morning when she can. She now reprots tablets she has remaining at home are 37.5 mg IR not taking the 75 mg IR last E scribed.  She agrees the capsule has worked best in the past is willing to have that sent to [place] in the next highest dose which would be the 75 mg XR.  He could not take the modafinil in the morning as the dizziness she has been experiencing when she was seen by PCP became worse when she took the modafinil so she wondered if the modafinil was the cause.  However the dizziness has been there inclluding for interim medical appointments and just seems to exacerbate when she takes the modafinil therefore needing to work on the did dizziness first before using the modafinil such as to drive.  She will go to the pool today with neighbors and appreciates warm weather approaching summer reminding her [place].  She thereby  reports collaborative self-help and improvement.  She had good food with brother's wife cooking but otherwise being ambivalent over the oldest brother from [place] visiting bringing dogs with him creating chaotic mess in the household.  She has seen her cousin, and feels more connected again.  She recalls being suicidal once many years ago but no longer, but she has limited hope she will overcome the current depression.  She has also decided to start [facility] counseling having an appointment for June 15.  She has no current mania, suicidality, psychosis or delirium.  Depression The patient presents withstuttering exacerbation ofrecurrentdepression withouthypomaniaassociated but with situational triggers. The onset quality is sudden. The problem occurs intermittently.The problem has been waxing and waning since onset.Associated symptoms includesomatic and cognitive anxiety,increased appetite for sugar,excessive worry, overthinking doubt,hypersomnia,irritability,apathy,fatigue,decreased concentration,decreased interest,headaches, andagitateddysphoria. hopeless guilt. Associated symptoms includenomotivationalchange,nohelplessness,no body aches, no restlessness,and no suicidal ideas.The symptoms are aggravated by social issues and family issues.Past treatments include SSRIs - Selective serotonin reuptake inhibitors, SNRIs - Serotonin and norepinephrine reuptake inhibitors, other medications and previous psychotherapy.Compliance with treatment is good.Past compliance problems include medical issues, medication issues, difficulty with treatment plan and pharmacy issues.Previous treatment provided moderaterelief.Risk factors include a change in medication usage/dosage, family history, family history of mental illness, history of mental illness, menopause, major life event, a recent illness and stress. Past medical history includes chronic  illness,recent illness,physical disability,brain trauma,anxiety,bipolar disorder,depression,mental health disorderand obsessive-compulsive disorder. Pertinent negatives include no life-threatening condition,no eating disorder,no post-traumatic stress disorder,no schizophrenia,no suicide attemptsand no head  trauma.  Individual Medical History/ Review of Systems: Changes? :Yes Weight is up 2 pounds in the last month as she has become somewhat more active with restoration of appetite but dizziness limiting use of her modafinil about which she is to see PCP today.  Allergies: Amoxicillin  Current Medications:  Current Outpatient Medications:  .  baclofen (LIORESAL) 10 MG tablet, Take 10 mg by mouth 2 (two) times daily. , Disp: , Rfl:  .  busPIRone (BUSPAR) 10 MG tablet, TAKE 1 TABLET TWICE DAILY, Disp: 180 tablet, Rfl: 0 .  clonazePAM (KLONOPIN) 0.5 MG tablet, Take 1 tablet (0.5 mg total) by mouth daily as needed for anxiety., Disp: 30 tablet, Rfl: 0 .  ezetimibe (ZETIA) 10 MG tablet, Take 1 tablet by mouth once daily, Disp: 90 tablet, Rfl: 1 .  metoprolol succinate (TOPROL XL) 25 MG 24 hr tablet, Take 1 tablet (25 mg total) by mouth daily., Disp: 90 tablet, Rfl: 3 .  modafinil (PROVIGIL) 100 MG tablet, TAKE ONE TABLET BY MOUTH EVERY MORNING, Disp: 30 tablet, Rfl: 3 .  MYRBETRIQ 25 MG TB24 tablet, Take 25 mg by mouth daily., Disp: , Rfl: 3 .  OXcarbazepine (TRILEPTAL) 150 MG tablet, TAKE 1 AND 1/2 TABLETS TWICE DAILY, Disp: 270 tablet, Rfl: 0 .  pregabalin (LYRICA) 50 MG capsule, Take 50 mg by mouth daily., Disp: , Rfl:  .  Progesterone Micronized (PROGESTERONE PO), Take 50 mg by mouth daily. Takes 100mg  daily. Bio- Identical Hormones, Disp: , Rfl:  .  topiramate (TOPAMAX) 50 MG tablet, Take 0.5 tablets (25 mg total) by mouth at bedtime., Disp: 90 tablet, Rfl: 0 .  traZODone (DESYREL) 50 MG tablet, TAKE 1 TABLET BY MOUTH ONCE DAILY AT BEDTIME AS NEEDED FOR  INSOMNIA, Disp: 30 tablet,  Rfl: 0 .  venlafaxine XR (EFFEXOR-XR) 75 MG 24 hr capsule, Take 1 capsule (75 mg total) by mouth daily with breakfast., Disp: 90 capsule, Rfl: 1 .  ziprasidone (GEODON) 20 MG capsule, TAKE 1 CAPSULE AT BEDTIME, Disp: 90 capsule, Rfl: 0  Medication Side Effects: dizziness/lightheadedness  Family Medical/ Social History: Changes? No  MENTAL HEALTH EXAM:  Height 5\' 6"  (1.676 m), weight 200 lb (90.7 kg), last menstrual period 06/28/2008.Body mass index is 32.28 kg/m.Marland Kitchen Muscle strengths and tone 4/4, postural reflexes and gait -1/-1, and AIMS = 0 otherwise deferred for coronavirus shutdown.  General Appearance: Casual, Fairly Groomed and Meticulous  Eye Contact:  Good  Speech:  Clear and Coherent, Normal Rate and Talkative  Volume:  Normal  Mood:  Anxious, Depressed, Dysphoric and Euthymic  Affect:  Congruent, Depressed, Inappropriate, Labile, Full Range and Anxious  Thought Process:  Coherent, Goal Directed, Irrelevant and Descriptions of Associations: Tangential  Orientation:  Full (Time, Place, and Person)  Thought Content: Rumination and Tangential   Suicidal Thoughts:  No  Homicidal Thoughts:  No  Memory:  Immediate;   Good and Fair Remote;   Good and Fair  Judgement:  Fair  Insight:  Fair  Psychomotor Activity:  Normal, Decreased, Mannerisms and Psychomotor Retardation  Concentration:  Concentration: Fair and Attention Span: Fair  Recall:  [employer] of Knowledge: Good  Language: Good  Assets:  [employer] for Improvement Leisure Time Resilience Talents/Skills  ADL's:  Intact  Cognition: WNL  Prognosis:  Poor    DIAGNOSES:    ICD-10-CM   1. Bipolar II disorder, moderate, depressed, with anxious distress (HCC)  F31.81 venlafaxine XR (EFFEXOR-XR) 75 MG 24 hr capsule  2. Generalized anxiety disorder  F41.1  venlafaxine XR (EFFEXOR-XR) 75 MG 24 hr capsule  3. Circadian rhythm sleep disorder, unspecified type  G47.20   4. MS (multiple sclerosis) ([place])  G35     Receiving  Psychotherapy: Yes  to start [facility] counseling having an appointment for June 15.   RECOMMENDATIONS: The patient clarifies from last appointment 1 month ago that she had been taking 37.5 mg IR Effexor every morning and increased to 56.25 mg in the interim.  Today she concludes to Effexor 75 mg XR every morning sent as #90 with 1 refill to [facility] will order pharmacy for bipolar depression and generalized anxiety.  She otherwise continues current supply without change for Geodon 20 mg every evening meal for bipolar disorder, Trileptal 150 mg tablet taking 1.5 tablets twice daily for bipolar, BuSpar 10 mg twice daily usually only in the evening for generalized anxiety, and as needed dosing for modafinil 100 mg in the morning, 2.5 mg at night, as it and one half of the 50 mg at bedtime if needed for insomnia.  Psychosupportive psychotherapy matches upcoming therapy and current medications to target symptoms for diagnoses for self help in the interim, to return in 2 months or sooner if needed.   Delight Hoh, MD

## 2019-12-13 NOTE — Progress Notes (Signed)
Subjective:    Patient ID: Sarah Hogan, female    DOB: [birth year], 59 y.o.   MRN: [id number]  HPI Chief Complaint  Patient presents with  . other    dizziness not in the morning at evening and night time, when she gets up from chair started 1 month ago   Complains of a one month history of dizziness upon standing in the late afternoon or early evening only. Does not happen every day. 5 days per week on average. States dizziness lasts only a couple of minutes. Last night was her worst episode.  Describes dizziness as "feeling like you are drinking". No spinning sensation or lightheadedness. Denies falling.  States she does not have dizziness when she gets out of bed in the morning or even in the middle of the night to go to the bathroom.    States her diet is not that healthy and does not include vegetables/greens usually.   States she had a tick bite 2 weeks ago on her lower back but does not think this is related since it occurred weeks after the dizziness started but wanted to let me know. States the tick was the size of the head of a pin and she thinks it had a white spot on top of it. States it was not engorged.  No bullseye rash. No rash of any type. Denies fever, chills, headache, neck pain.   No vision changes, chest pain, palpitations, shortness of breath, abdominal pain, N/V/D, urinary symptoms or LE edema. No arthralgias or myalgias.  Denies numbness, tingling or weakness.    States she has a sedentary lifestyle. Denies alcohol or drug use.   States she is followed by Dr. Radford Pax for her HTN. Stats her Ttoprol dose was lowered to give her higher blood pressure. She does not check her BP at home.   States she has been in a "really bad funk" and her psychiatrist is working with her on her medications. Dr. Creig Hines is her psychiatrist. Increased Effexor to 75 mg today.  States she stopped Buspar and Topamax 3 months ago. She is trying to come off of some of her medications.    Reviewed allergies, medications, past medical, surgical, family, and social history.     Review of Systems Pertinent positives and negatives in the history of present illness.     Objective:   Physical Exam Constitutional:      General: She is not in acute distress.    Appearance: Normal appearance. She is not ill-appearing.  Eyes:     General: Lids are normal.     Extraocular Movements: Extraocular movements intact.     Right eye: No nystagmus.     Left eye: No nystagmus.     Conjunctiva/sclera: Conjunctivae normal.     Pupils: Pupils are equal, round, and reactive to light.  Cardiovascular:     Rate and Rhythm: Normal rate and regular rhythm.     Pulses: Normal pulses.  Pulmonary:     Effort: Pulmonary effort is normal.     Breath sounds: Normal breath sounds.  Musculoskeletal:     Cervical back: Normal range of motion and neck supple.  Lymphadenopathy:     Cervical: No cervical adenopathy.     Right cervical: No superficial cervical adenopathy.    Left cervical: No superficial cervical adenopathy.  Skin:    General: Skin is warm and dry.     Capillary Refill: Capillary refill takes less than 2 seconds.     Coloration:  Skin is not pale.     Findings: No rash.  Neurological:     General: No focal deficit present.     Mental Status: She is alert.     Cranial Nerves: Cranial nerves are intact.     Sensory: Sensation is intact.     Motor: Motor function is intact.     Coordination: Coordination is intact.     Gait: Gait is intact.  Psychiatric:        Attention and Perception: Attention normal.        Mood and Affect: Mood and affect normal.        Speech: Speech normal.        Cognition and Memory: Cognition and memory normal.    Temp 97.8 F (36.6 C)   Wt 202 lb 3.2 oz (91.7 kg)   LMP 06/28/2008   BMI 32.64 kg/m       Assessment & Plan:  Dizziness - Plan: CBC with Differential/Platelet, Comprehensive metabolic panel, TSH, T4, free, T3  Elevated  serum creatinine - Plan: Comprehensive metabolic panel  Abnormal thyroid blood test - Plan: TSH, T4, free, T3  Tick bite of lower back, initial encounter  She is not fasting, states she ate 1/2 chicken salad sandwich and 2 cookies 30 minutes prior to arrival.  She is not orthostatic.  No red flag symptoms.  Unclear etiology for dizziness that only occurs in the late afternoon or early evening and lasts a brief time.  She will keep a journal and look for any triggers or new symptoms.  Discussed that the tick that bit her was not engorged and did not have a blood meal. No significant reaction and no worrisome symptoms from this bite but we did discuss red flag symptoms such as fever, severe headache, neck stiffness, arthralgias or rash.  I will check labs and follow up pending results or in one week at her regularly scheduled wellness visit.

## 2019-12-14 LAB — COMPREHENSIVE METABOLIC PANEL
ALT: 10 IU/L (ref 0–32)
AST: 15 IU/L (ref 0–40)
Albumin/Globulin Ratio: 2 (ref 1.2–2.2)
Albumin: 4.4 g/dL (ref 3.8–4.9)
Alkaline Phosphatase: 70 IU/L (ref 48–121)
BUN/Creatinine Ratio: 13 (ref 9–23)
BUN: 13 mg/dL (ref 6–24)
Bilirubin Total: 0.2 mg/dL (ref 0.0–1.2)
CO2: 20 mmol/L (ref 20–29)
Calcium: 9.4 mg/dL (ref 8.7–10.2)
Chloride: 99 mmol/L (ref 96–106)
Creatinine, Ser: 0.97 mg/dL (ref 0.57–1.00)
GFR calc Af Amer: 74 mL/min/{1.73_m2} (ref >59)
GFR calc non Af Amer: 64 mL/min/{1.73_m2} (ref >59)
Globulin, Total: 2.2 g/dL (ref 1.5–4.5)
Glucose: 100 mg/dL — ABNORMAL HIGH (ref 65–99)
Potassium: 4.6 mmol/L (ref 3.5–5.2)
Sodium: 135 mmol/L (ref 134–144)
Total Protein: 6.6 g/dL (ref 6.0–8.5)

## 2019-12-14 LAB — T3: T3, Total: 105 ng/dL (ref 71–180)

## 2019-12-14 LAB — CBC WITH DIFFERENTIAL/PLATELET
Basophils Absolute: 0 10*3/uL (ref 0.0–0.2)
Basos: 0 %
EOS (ABSOLUTE): 0.1 10*3/uL (ref 0.0–0.4)
Eos: 2 %
Hematocrit: 42.4 % (ref 34.0–46.6)
Hemoglobin: 14.1 g/dL (ref 11.1–15.9)
Immature Grans (Abs): 0 10*3/uL (ref 0.0–0.1)
Immature Granulocytes: 0 %
Lymphocytes Absolute: 1.1 10*3/uL (ref 0.7–3.1)
Lymphs: 23 %
MCH: 28.5 pg (ref 26.6–33.0)
MCHC: 33.3 g/dL (ref 31.5–35.7)
MCV: 86 fL (ref 79–97)
Monocytes Absolute: 0.4 10*3/uL (ref 0.1–0.9)
Monocytes: 9 %
Neutrophils Absolute: 3 10*3/uL (ref 1.4–7.0)
Neutrophils: 66 %
Platelets: 210 10*3/uL (ref 150–450)
RBC: 4.94 x10E6/uL (ref 3.77–5.28)
RDW: 12.7 % (ref 11.7–15.4)
WBC: 4.6 10*3/uL (ref 3.4–10.8)

## 2019-12-14 LAB — T4, FREE: Free T4: 0.94 ng/dL (ref 0.82–1.77)

## 2019-12-14 LAB — TSH: TSH: 1.83 u[IU]/mL (ref 0.450–4.500)

## 2019-12-20 NOTE — Patient Instructions (Addendum)
  Sarah Hogan , Thank you for taking time to come for your Medicare Wellness Visit. I appreciate your ongoing commitment to your health goals. Please review the following plan we discussed and let me know if I can assist you in the future.   These are the goals we discussed:   Call and schedule your bone density at [facility].   [facility] will call you to schedule.   I will be in touch with your lab results.     This is a list of the screening recommended for you and due dates:  Health Maintenance  Topic Date Due  . COVID-19 Vaccine (1) Never done  . Colon Cancer Screening  Never done  . Flu Shot  02/20/2020  . Mammogram  11/21/2021  . Pap Smear  01/11/2022  . Tetanus Vaccine  01/11/2029  .  Hepatitis C: One time screening is recommended by Center for Disease Control  (CDC) for  adults born from [birth year] through 1966.   Completed  . HIV Screening  Completed

## 2019-12-20 NOTE — Progress Notes (Addendum)
Sarah Hogan is a 59 y.o. female who presents for annual wellness visit, CPE and follow-up on chronic medical conditions.  She has the following concerns:  She would like to have her urine checked. States she wants to make sure she does not have a UTI. Denies any urinary symptoms.   She is under the care of several specialists.   HL- Statin intolerant- states statins made her legs swell and she had leg pain.  Reports taking Zetia daily. States she did tolerate Pravastatin and CoQ10.   Steroid therapy in past for MS. Daily use stopped in 2007.   Vitamin D deficiency in the past. Is not currently taking a supplement.   MS managed by neurologist.    [facility] [place]. States she is taking Bioidentical hormones managed by her gynecologist.  Due for Cologuard- states it was negative in 2017. She would actually like to have a colonoscopy.  Last colonoscopy done when she was age 11. States her brother has polyps and has to get regular colonoscopies.   She lives alone. No children.    Immunization History  Administered Date(s) Administered  . Influenza Split 04/04/2013, 07/22/2013  . Influenza, Seasonal, Injecte, Preservative Fre 04/20/2009  . Influenza,inj,Quad PF,6+ Mos 03/26/2018, 04/19/2019  . Influenza,inj,quad, With Preservative 07/22/2014  . Influenza-Unspecified 07/22/2013, 05/21/2017, 03/26/2018  . Janssen (J&J) SARS-COV-2 Vaccination 10/09/2019  . Tdap 07/22/2000, 01/12/2019  . Zoster Recombinat (Shingrix) 02/02/2019, 04/05/2019   Last Pap smear: 2020 has obgyn appt at end of june Last mammogram: may 2021 Last colonoscopy: at age 73 in [place]. Cologuard 2017 negative. Brother with polyps.  Last DEXA: 2008 Dentist: every 6 months Ophtho: Dr. Katy Fitch Exercise: none Diet: high in sodium. She does not cook. Eats frozen meals or fast food.   Other doctors caring for patient include: OB/GYN-  Dr. Sabra Heck Psychiatrist- Dr. Creig Hines at [place].   Neurologist -Dr. George Hugh Cardiologist- Dr. Radford Pax. Urogynoecology- Geradine Girt - bladder control Dermatologist appt scheduled.     Depression screen:  See questionnaire below.  Depression screen [facility] 2/9 12/21/2019 12/16/2018 12/10/2017 02/26/2017  Decreased Interest 0 0 3 3  Down, Depressed, Hopeless 0 0 3 3  PHQ - 2 Score 0 0 6 6  Altered sleeping - - 0 3  Tired, decreased energy - - 3 3  Change in appetite - - 1 3  Feeling bad or failure about yourself  - - 3 3  Trouble concentrating - - 3 3  Moving slowly or fidgety/restless - - 3 0  Suicidal thoughts - - 0 0  PHQ-9 Score - - 19 21  Difficult doing work/chores - - Somewhat difficult Somewhat difficult    Fall Risk Screen: see questionnaire below. Fall Risk  12/21/2019 12/16/2018 12/10/2017 02/26/2017  Falls in the past year? 0 1 Yes Yes  Number falls in past yr: 0 0 2 or more 2 or more  Injury with Fall? - 1 No Yes  Risk for fall due to : - - - History of fall(s);Impaired balance/gait    ADL screen:  See questionnaire below Functional Status Survey: Is the patient deaf or have difficulty hearing?: No Does the patient have difficulty seeing, even when wearing glasses/contacts?: No Does the patient have difficulty concentrating, remembering, or making decisions?: Yes Does the patient have difficulty walking or climbing stairs?: No Does the patient have difficulty dressing or bathing?: No Does the patient have difficulty doing errands alone such as visiting a doctor's office  or shopping?: No   End of Life Discussion:  Patient does not have a living will and medical power of attorney. MOST form reviewed and signed. Packet of information provided and reviewed.   Review of Systems Constitutional: -fever, -chills, -sweats, -unexpected weight change, -anorexia, -fatigue Allergy: -sneezing, -itching, -congestion Dermatology: denies changing moles, rash, lumps, new worrisome lesions ENT: -runny nose, -ear pain, -sore  throat, -hoarseness, -sinus pain, -teeth pain, -tinnitus, -hearing loss, -epistaxis Cardiology:  -chest pain, -palpitations, -edema, -orthopnea, -paroxysmal nocturnal dyspnea Respiratory: -cough, -shortness of breath, -dyspnea on exertion, -wheezing, -hemoptysis Gastroenterology: -abdominal pain, -nausea, -vomiting, -diarrhea, -constipation, -blood in stool, -changes in bowel movement, -dysphagia Hematology: -bleeding, bruises easily  Musculoskeletal: -arthralgias, -myalgias, -joint swelling, -back pain, -neck pain, -cramping, -gait changes Ophthalmology: -vision changes, -eye redness, -itching, -discharge Urology: -dysuria, -difficulty urinating, -hematuria, -urinary frequency, -urgency, -incontinence Neurology: -headache, -weakness, -tingling, -numbness, -speech abnormality, -memory loss, -falls, + intermittent dizziness Psychology:  +depressed mood (improving), -agitation, -sleep problems    PHYSICAL EXAM:  BP 120/80   Pulse 65   Ht 5' 7.75" (1.721 m)   Wt 200 lb 3.2 oz (90.8 kg)   LMP 06/28/2008   BMI 30.67 kg/m   General Appearance: Alert, cooperative, no distress, appears stated age Head: Normocephalic, without obvious abnormality, atraumatic Eyes: PERRL, conjunctiva/corneas clear, EOM's intact Ears: Normal TM's and external ear canals Nose: Mask in place  Throat: Mask in place  Neck: Supple, no lymphadenopathy; thyroid: no enlargement/tenderness/nodules; no JVD Back: Spine nontender, no curvature, ROM normal, no CVA tenderness Lungs: Clear to auscultation bilaterally without wheezes, rales or ronchi; respirations unlabored Chest Wall: No tenderness or deformity Heart: Regular rate and rhythm, S1 and S2 normal, no murmur, rub or gallop Breast Exam: OB/GYN Abdomen: Soft, non-tender, nondistended, normoactive bowel sounds, no masses, no hepatosplenomegaly Genitalia: OB/GYN Extremities: No clubbing, cyanosis or edema Pulses: 2+ and symmetric all extremities Skin: Skin color,  texture, turgor normal, no rashes or lesions Lymph nodes: Cervical, supraclavicular, and axillary nodes normal Neurologic: CNII-XII intact, normal strength, sensation and gait Psych: Normal mood, affect, hygiene and grooming.  ASSESSMENT/PLAN: Medicare annual wellness visit, subsequent -Here today for a Medicare wellness visit.  She is fasting.  Denies falls, difficulties with ADLs, worsening memory or mood.  She is under the care of multiple specialists.  She has no new concerns today.  She lives alone and denies any difficulty caring for herself.  Advanced directives discussed  Routine general medical examination at a health care facility - Plan: Basic metabolic panel -Reviewed preventive health care.  She sees her OB/GYN regularly.  Overdue for colonoscopy and she requests a referral to GI, this was done.  Immunizations reviewed and she is up-to-date.  Counseling on healthy lifestyle including diet and exercise.  Her diet is currently high in sodium and we discussed that she seems to be tolerating this okay for now but this could pose potential blood pressure and health consequences.  Discussed safety and health promotion.  Pure hypercholesterolemia - Plan: Lipid panel -History of statin intolerance except to pravastatin.  Consider starting her back on this medication pending lipid panel results  Statin intolerance - Plan: Lipid panel -Currently is only taking Zetia.  States she recalls doing fine with pravastatin and CoQ10.  She is open to starting back on this if needed.  Vitamin D deficiency - Plan: VITAMIN D 25 Hydroxy (Vit-D Deficiency, Fractures) -She is not currently on a supplement.  Check vitamin D level and follow-up  History of steroid therapy - Plan:  DG Bone Density -No recent fracture.  Screen for osteoporosis.  She will call and schedule bone density  At risk for osteoporosis - Plan: DG Bone Density -She will call and schedule her bone density test.  Check vitamin D level  and recommend supplement dose as appropriate.  Screen for colon cancer - Plan: Ambulatory referral to Gastroenterology -GI referral made.  Last colonoscopy in [place] more than 10 years ago.  Estrogen deficiency - Plan: DG Bone Density -She will call and schedule her bone density.  Advance directive discussed with patient -Counseling on HCPOA and living will.  Forms provided.  MOST form reviewed and signed.  She is a full code.  Addendum: leukocytes in urine. Send for culture.    Discussed monthly self breast exams and yearly mammograms; at least 30 minutes of aerobic activity at least 5 days/week and weight-bearing exercise 2x/week; proper sunscreen use reviewed; healthy diet, including goals of calcium and vitamin D intake and alcohol recommendations (less than or equal to 1 drink/day) reviewed; regular seatbelt use; changing batteries in smoke detectors.  Immunization recommendations discussed.  Colonoscopy recommendations reviewed   Medicare Attestation I have personally reviewed: The patient's medical and social history Their use of alcohol, tobacco or illicit drugs Their current medications and supplements The patient's functional ability including ADLs,fall risks, home safety risks, cognitive, and hearing and visual impairment Diet and physical activities Evidence for depression or mood disorders  The patient's weight, height, and BMI have been recorded in the chart.  I have made referrals, counseling, and provided education to the patient based on review of the above and I have provided the patient with a written personalized care plan for preventive services.     Harland Dingwall, NP-C   12/21/2019

## 2019-12-21 ENCOUNTER — Encounter: Payer: Self-pay | Admitting: Gastroenterology

## 2019-12-21 ENCOUNTER — Other Ambulatory Visit: Payer: Self-pay

## 2019-12-21 ENCOUNTER — Ambulatory Visit: Payer: Medicare PPO | Admitting: Family Medicine

## 2019-12-21 ENCOUNTER — Encounter: Payer: Self-pay | Admitting: Family Medicine

## 2019-12-21 VITALS — BP 120/80 | HR 65 | Ht 67.75 in | Wt 200.2 lb

## 2019-12-21 DIAGNOSIS — E7800 Pure hypercholesterolemia, unspecified: Secondary | ICD-10-CM

## 2019-12-21 DIAGNOSIS — Z92241 Personal history of systemic steroid therapy: Secondary | ICD-10-CM

## 2019-12-21 DIAGNOSIS — Z789 Other specified health status: Secondary | ICD-10-CM

## 2019-12-21 DIAGNOSIS — R82998 Other abnormal findings in urine: Secondary | ICD-10-CM

## 2019-12-21 DIAGNOSIS — Z1211 Encounter for screening for malignant neoplasm of colon: Secondary | ICD-10-CM | POA: Diagnosis not present

## 2019-12-21 DIAGNOSIS — E559 Vitamin D deficiency, unspecified: Secondary | ICD-10-CM

## 2019-12-21 DIAGNOSIS — Z9189 Other specified personal risk factors, not elsewhere classified: Secondary | ICD-10-CM

## 2019-12-21 DIAGNOSIS — E2839 Other primary ovarian failure: Secondary | ICD-10-CM | POA: Diagnosis not present

## 2019-12-21 DIAGNOSIS — Z0000 Encounter for general adult medical examination without abnormal findings: Secondary | ICD-10-CM

## 2019-12-21 DIAGNOSIS — Z7189 Other specified counseling: Secondary | ICD-10-CM | POA: Diagnosis not present

## 2019-12-21 LAB — POCT URINALYSIS DIP (PROADVANTAGE DEVICE)
Bilirubin, UA: NEGATIVE
Blood, UA: NEGATIVE
Glucose, UA: NEGATIVE mg/dL
Ketones, POC UA: NEGATIVE mg/dL
Nitrite, UA: NEGATIVE
Protein Ur, POC: NEGATIVE mg/dL
Specific Gravity, Urine: 1.015
Urobilinogen, Ur: NEGATIVE
pH, UA: 6.5 (ref 5.0–8.0)

## 2019-12-21 NOTE — Addendum Note (Signed)
Addended by: Minette Headland A on: 12/21/2019 10:48 AM   Modules accepted: Orders

## 2019-12-21 NOTE — Addendum Note (Signed)
Addended by: Girtha Rm on: 12/21/2019 11:20 AM   Modules accepted: Orders

## 2019-12-22 LAB — BASIC METABOLIC PANEL
BUN/Creatinine Ratio: 10 (ref 9–23)
BUN: 11 mg/dL (ref 6–24)
CO2: 23 mmol/L (ref 20–29)
Calcium: 9.6 mg/dL (ref 8.7–10.2)
Chloride: 106 mmol/L (ref 96–106)
Creatinine, Ser: 1.07 mg/dL — ABNORMAL HIGH (ref 0.57–1.00)
GFR calc Af Amer: 66 mL/min/{1.73_m2} (ref >59)
GFR calc non Af Amer: 57 mL/min/{1.73_m2} — ABNORMAL LOW (ref >59)
Glucose: 90 mg/dL (ref 65–99)
Potassium: 4.7 mmol/L (ref 3.5–5.2)
Sodium: 144 mmol/L (ref 134–144)

## 2019-12-22 LAB — LIPID PANEL
Chol/HDL Ratio: 2.8 ratio (ref 0.0–4.4)
Cholesterol, Total: 205 mg/dL — ABNORMAL HIGH (ref 100–199)
HDL: 74 mg/dL (ref >39)
LDL Chol Calc (NIH): 116 mg/dL — ABNORMAL HIGH (ref 0–99)
Triglycerides: 83 mg/dL (ref 0–149)
VLDL Cholesterol Cal: 15 mg/dL (ref 5–40)

## 2019-12-22 LAB — VITAMIN D 25 HYDROXY (VIT D DEFICIENCY, FRACTURES): Vit D, 25-Hydroxy: 25.3 ng/mL — ABNORMAL LOW (ref 30.0–100.0)

## 2019-12-23 DIAGNOSIS — G35 Multiple sclerosis: Secondary | ICD-10-CM | POA: Diagnosis not present

## 2019-12-24 LAB — URINE CULTURE

## 2020-01-03 ENCOUNTER — Other Ambulatory Visit: Payer: Self-pay | Admitting: Psychiatry

## 2020-01-03 ENCOUNTER — Other Ambulatory Visit: Payer: Self-pay | Admitting: Family Medicine

## 2020-01-03 DIAGNOSIS — F3130 Bipolar disorder, current episode depressed, mild or moderate severity, unspecified: Secondary | ICD-10-CM

## 2020-01-11 NOTE — Progress Notes (Signed)
59 y.o. G0P0000 Single White or Caucasian female here for annual exam.  Doing well physically.  Reports the year was hard, emotionally.    Denies vaginal bleeding.    Patient's last menstrual period was 06/28/2008.          Sexually active: No.  The current method of family planning is post menopausal status.    Exercising: Yes.    walking Smoker:  no  Health Maintenance: Pap:  2018 normal, 01-12-2019 neg HPV HR neg History of abnormal Pap:  no MMG:  11-23-2019 bilateral & rt breast category c density birads 1:neg Colonoscopy:  2010 normal, had cologuard done in [place]-neg per patient, colonoscopy scheduled for 01/2020 BMD:   ~2008, scheduled for 03/2020 TDaP:  2020 Pneumonia vaccine(s):  Not done Shingrix:   2020 Hep C testing: neg 2020 Screening Labs: 11/2018   reports that she quit smoking about 25 years ago. Her smoking use included cigarettes. She has a 30.00 pack-year smoking history. She has never used smokeless tobacco. She reports current alcohol use. She reports that she does not use drugs.  Past Medical History:  Diagnosis Date  . Bipolar 2 disorder ([place])   . Cervical polyp 12/16/2018  . Former smoker 12/16/2018  . Hyperlipidemia   . Leukopenia   . Mild aortic insufficiency    by echo 07/2016  . Mild mitral regurgitation    by echo 07/2016  . MS (multiple sclerosis) ([place])   . Normal coronary arteries cath 2015  . Overactive bladder   . PVC's (premature ventricular contractions) 04/22/2017  . SVT (supraventricular tachycardia) ([place]) 04/22/2017  . Vitamin D deficiency     Past Surgical History:  Procedure Laterality Date  . BREAST BIOPSY Left 2002  . CARPAL TUNNEL RELEASE    . CHOLECYSTECTOMY      Current Outpatient Medications  Medication Sig Dispense Refill  . baclofen (LIORESAL) 10 MG tablet Take 10 mg by mouth 2 (two) times daily.     . clonazePAM (KLONOPIN) 0.5 MG tablet Take 1 tablet (0.5 mg total) by mouth daily as needed for anxiety. 30 tablet 0  .  ezetimibe (ZETIA) 10 MG tablet Take 1 tablet by mouth once daily 90 tablet 0  . metoprolol succinate (TOPROL XL) 25 MG 24 hr tablet Take 1 tablet (25 mg total) by mouth daily. 90 tablet 3  . modafinil (PROVIGIL) 100 MG tablet TAKE ONE TABLET BY MOUTH EVERY MORNING 30 tablet 3  . MYRBETRIQ 25 MG TB24 tablet Take 25 mg by mouth daily.  3  . OXcarbazepine (TRILEPTAL) 150 MG tablet TAKE 1 AND 1/2 TABLETS TWICE DAILY (Patient taking differently: Takes 1 tablet twice daily) 270 tablet 0  . pregabalin (LYRICA) 50 MG capsule Take 50 mg by mouth daily.    . Progesterone Micronized (PROGESTERONE PO) Take 50 mg by mouth daily. Takes 100mg  daily. Bio- Identical Hormones    . traZODone (DESYREL) 50 MG tablet TAKE 1 TABLET BY MOUTH ONCE DAILY AT BEDTIME AS NEEDED FOR  INSOMNIA 30 tablet 0  . venlafaxine XR (EFFEXOR-XR) 75 MG 24 hr capsule Take 1 capsule (75 mg total) by mouth daily with breakfast. 90 capsule 1  . ziprasidone (GEODON) 20 MG capsule TAKE 1 CAPSULE AT BEDTIME 90 capsule 0   No current facility-administered medications for this visit.    Family History  Problem Relation Age of Onset  . Stroke Mother 29  . Dementia Mother   . Bipolar disorder Mother   . Hypertension Brother   .  Hyperlipidemia Brother   . Obesity Brother   . Breast cancer Paternal Grandmother        over 33  . Skin cancer Paternal Grandmother   . Colon polyps Brother     Review of Systems  Constitutional: Negative.   HENT: Negative.   Eyes: Negative.   Respiratory: Negative.   Cardiovascular: Negative.   Gastrointestinal: Negative.   Endocrine: Negative.   Genitourinary: Negative.   Musculoskeletal: Negative.   Skin: Negative.   Allergic/Immunologic: Negative.   Neurological: Negative.   Hematological: Negative.   Psychiatric/Behavioral: Negative.     Exam:   BP 100/64   Pulse 60   Temp (!) 97.1 F (36.2 C) (Skin)   Resp 16   Ht 5' 6.25" (1.683 m)   Wt 196 lb (88.9 kg)   LMP 06/28/2008   BMI 31.40  kg/m   Height: 5' 6.25" (168.3 cm)  General appearance: alert, cooperative and appears stated age Head: Normocephalic, without obvious abnormality, atraumatic Neck: no adenopathy, supple, symmetrical, trachea midline and thyroid normal to inspection and palpation Lungs: clear to auscultation bilaterally Breasts: normal appearance, no masses or tenderness Heart: regular rate and rhythm Abdomen: soft, non-tender; bowel sounds normal; no masses,  no organomegaly Extremities: extremities normal, atraumatic, no cyanosis or edema Skin: Skin color, texture, turgor normal. No rashes or lesions Lymph nodes: Cervical, supraclavicular, and axillary nodes normal. No abnormal inguinal nodes palpated Neurologic: Grossly normal   Pelvic: External genitalia:  no lesions              Urethra:  normal appearing urethra with no masses, tenderness or lesions              Bartholins and Skenes: normal                 Vagina: normal appearing vagina with normal color and discharge, no lesions              Cervix: no lesions              Pap taken: No. Bimanual Exam:  Uterus:  normal size, contour, position, consistency, mobility, non-tender              Adnexa: normal adnexa and no mass, fullness, tenderness               Rectovaginal: Confirms               Anus:  normal sphincter tone, no lesions  Chaperone, Terence Lux, CMA, was present for exam.  A:  Well Woman with normal exam PMP, on progesterone only therapy MS, diagnosed 74 H/o cervical polyps Family hx of breast cancer in [place] H/o bipolar d/o OAB  P:   Mammogram guidelines reviewed pap smear neg with neg HR HPV 2020.  Not indicated today. Rx for Pometrium 100mg  daily.  #90/4RF Lab work done with Harland Dingwall Colonoscopy is scheduled with Dr. Silverio Decamp Has BMD scheduled as well Vaccines reviewed.  She is UTD. Return annually or prn

## 2020-01-14 ENCOUNTER — Ambulatory Visit (INDEPENDENT_AMBULATORY_CARE_PROVIDER_SITE_OTHER): Payer: Medicare PPO | Admitting: Obstetrics & Gynecology

## 2020-01-14 ENCOUNTER — Other Ambulatory Visit: Payer: Self-pay

## 2020-01-14 ENCOUNTER — Encounter: Payer: Self-pay | Admitting: Obstetrics & Gynecology

## 2020-01-14 VITALS — BP 100/64 | HR 60 | Temp 97.1°F | Resp 16 | Ht 66.25 in | Wt 196.0 lb

## 2020-01-14 DIAGNOSIS — Z01419 Encounter for gynecological examination (general) (routine) without abnormal findings: Secondary | ICD-10-CM

## 2020-01-14 MED ORDER — NONFORMULARY OR COMPOUNDED ITEM: 4 refills | Status: DC

## 2020-01-14 MED ORDER — PROGESTERONE MICRONIZED 100 MG PO CAPS
100.0000 mg | ORAL_CAPSULE | Freq: Every day | ORAL | 4 refills | Status: DC
Start: 2020-01-14 — End: 2020-01-14

## 2020-01-14 NOTE — Addendum Note (Signed)
Addended by: Megan Salon on: 01/14/2020 03:15 PM   Modules accepted: Orders

## 2020-01-17 ENCOUNTER — Ambulatory Visit: Payer: Self-pay | Admitting: Obstetrics & Gynecology

## 2020-01-25 DIAGNOSIS — S40861A Insect bite (nonvenomous) of right upper arm, initial encounter: Secondary | ICD-10-CM | POA: Diagnosis not present

## 2020-01-25 DIAGNOSIS — L578 Other skin changes due to chronic exposure to nonionizing radiation: Secondary | ICD-10-CM | POA: Diagnosis not present

## 2020-01-25 DIAGNOSIS — Z808 Family history of malignant neoplasm of other organs or systems: Secondary | ICD-10-CM | POA: Diagnosis not present

## 2020-01-25 DIAGNOSIS — L821 Other seborrheic keratosis: Secondary | ICD-10-CM | POA: Diagnosis not present

## 2020-01-25 DIAGNOSIS — Z85828 Personal history of other malignant neoplasm of skin: Secondary | ICD-10-CM | POA: Diagnosis not present

## 2020-01-25 DIAGNOSIS — W57XXXA Bitten or stung by nonvenomous insect and other nonvenomous arthropods, initial encounter: Secondary | ICD-10-CM | POA: Diagnosis not present

## 2020-01-25 DIAGNOSIS — L720 Epidermal cyst: Secondary | ICD-10-CM | POA: Diagnosis not present

## 2020-01-25 DIAGNOSIS — L814 Other melanin hyperpigmentation: Secondary | ICD-10-CM | POA: Diagnosis not present

## 2020-01-25 DIAGNOSIS — D225 Melanocytic nevi of trunk: Secondary | ICD-10-CM | POA: Diagnosis not present

## 2020-02-03 ENCOUNTER — Other Ambulatory Visit: Payer: Self-pay

## 2020-02-03 ENCOUNTER — Ambulatory Visit (AMBULATORY_SURGERY_CENTER): Payer: Self-pay | Admitting: *Deleted

## 2020-02-03 VITALS — Ht 66.25 in | Wt 194.0 lb

## 2020-02-03 DIAGNOSIS — Z1211 Encounter for screening for malignant neoplasm of colon: Secondary | ICD-10-CM

## 2020-02-03 MED ORDER — SUTAB 1479-225-188 MG PO TABS: 24.0000 | ORAL_TABLET | ORAL | 0 refills | Status: DC

## 2020-02-03 NOTE — Progress Notes (Signed)
j and j vaccine 10-09-2019  No egg or soy allergy known to patient  No issues with past sedation with any surgeries or procedures no intubation problems in the past  No diet pills per patient No home 02 use per patient  No blood thinners per patient  Pt denies issues with constipation  No A fib or A flutter  EMMI video to pt or MyChart  COVID 19 guidelines implemented in [facility] today   sutab  Coupon given to pt in [facility] today   Due to the COVID-19 pandemic we are asking patients to follow these guidelines. Please only bring one care partner. Please be aware that your care partner may wait in the car in the parking lot or if they feel like they will be too hot to wait in the car, they may wait in the lobby on the 4th floor. All care partners are required to wear a mask the entire time (we do not have any that we can provide them), they need to practice social distancing, and we will do a Covid check for all patient's and care partners when you arrive. Also we will check their temperature and your temperature. If the care partner waits in their car they need to stay in the parking lot the entire time and we will call them on their cell phone when the patient is ready for discharge so they can bring the car to the front of the building. Also all patient's will need to wear a mask into building.

## 2020-02-15 ENCOUNTER — Ambulatory Visit (INDEPENDENT_AMBULATORY_CARE_PROVIDER_SITE_OTHER): Payer: Medicare PPO | Admitting: Psychiatry

## 2020-02-15 ENCOUNTER — Encounter: Payer: Self-pay | Admitting: Psychiatry

## 2020-02-15 ENCOUNTER — Other Ambulatory Visit: Payer: Self-pay

## 2020-02-15 VITALS — Ht 65.25 in | Wt 197.0 lb

## 2020-02-15 DIAGNOSIS — G4720 Circadian rhythm sleep disorder, unspecified type: Secondary | ICD-10-CM | POA: Diagnosis not present

## 2020-02-15 DIAGNOSIS — F3181 Bipolar II disorder: Secondary | ICD-10-CM | POA: Diagnosis not present

## 2020-02-15 DIAGNOSIS — F411 Generalized anxiety disorder: Secondary | ICD-10-CM | POA: Diagnosis not present

## 2020-02-15 DIAGNOSIS — G35 Multiple sclerosis: Secondary | ICD-10-CM | POA: Diagnosis not present

## 2020-02-15 MED ORDER — CLONAZEPAM 0.5 MG PO TABS: 0.5000 mg | ORAL_TABLET | Freq: Every day | ORAL | 0 refills | Status: DC | PRN

## 2020-02-15 NOTE — Progress Notes (Signed)
[facility]  Patient ID: [name],  MRN: [id number]  PCP: Girtha Rm, NP-C  Date of Evaluation: 02/15/2020 Time spent:20 minutes from 1100 to 1120  Chief Complaint:  Chief Complaint    Depression; Manic Behavior; Anxiety; Stress      HISTORY/CURRENT STATUS: Sarah Hogan is seen onsite in office 20 minutes face-to-face individually with consent with epic collateral for psychiatric interview and exam in 44-month evaluation and management of bipolar II depressed, generalized anxiety, circadian rhythm sleep disorder, and comorbid multiple sclerosis. Patient is reporting improved mood in the last 2 weeks as if upon July 14 she experienced a lifting. She attributes this partially to the increased dose of Effexor from last appointment changing to the extended release increasing to 75 mg from the 56.25 mg IR dosing prior to that, though starting therapy was equally likely to have provided relief. Her birthday was 1 week later turning 59 years celebrated by her brother & sister-in-law for a wonderful evening in her experience. She has been very busy with health matters seeing GYN having mammogram and referral for colonoscopy to be tomorrow. She had MRI of the head in June reviewed by radiologist as stable multiple sclerosis of moderate severity duration 31 years having associated MR angiography which was clear. She has an eye exam coming up and by August she will be caught up with all of her medical matters. She is off of her BuSpar still taking Effexor, Trileptal, Geodon, Provigil, Klonopin, and trazodone, several of which are only as needed. She will exhaust remaining 2 week supply of Klonopin soon and requests an updated supply only in small quantity to [facility]. Tripleptal just arrived in the mail. She has no contact from [place] who should be back in the [place]. She still feels a sense of low motivation just reading at home with few other interests but mood better overall for the last  2 weeks. She does have a new counselor at [facility] seeing Kathlee Nations doing well having graduated from weekly therapy to every 2 weeks. She often feels there is no point to life but recovers from that quickly whether in her counseling, daily life, or treatment. There is no mania, suicidality, psychosis or delirium.  Depression The patient presents withstuttering exacerbation ofrecurrentbipolar II depression without recenthypomaniabut with associated but with associated situational triggers. The onset quality is sudden. The problem occurs intermittently.The problem has been waxing and waning since onset.Associated symptoms includesomatic and cognitive anxiety, excessive worry, overthinking doubt,increased appetite for sugar,hypersomnia,irritability,apathy,fatigue,decreased concentration,decreased interest,headaches, and reactivedysphoria.  Associated symptoms includenomotivationalchange,nohelplessness,no hopeless guilt, no body aches, norestlessness,no paranoia, and no suicidal ideas.The symptoms are aggravated by social issues and family issues.Past treatments include SSRIs - Selective serotonin reuptake inhibitors, SNRIs - Serotonin and norepinephrine reuptake inhibitors, other medications and previous psychotherapy.Compliance with treatment is good.Past compliance problems include medical issues, medication issues, difficulty with treatment plan and pharmacy issues.Previous treatment provided moderaterelief.Risk factors include a change in medication usage/dosage, family history, family history of mental illness, history of mental illness, menopause, major life event, a recent illness and stress. Past medical history includes chronic illness,recent illness,physical disability,brain trauma,anxiety,bipolar disorder,depression,mental health disorderand obsessive-compulsive disorder. Pertinent negatives include no life-threatening  condition,no eating disorder,no post-traumatic stress disorder,no schizophrenia,no suicide attemptsand no head trauma.  Individual Medical History/ Review of Systems: Changes? :Yes Weight is down 3 pounds in 2 months  Allergies: Amoxicillin  Current Medications:  Current Outpatient Medications:  .  baclofen (LIORESAL) 10 MG tablet, Take 10 mg by mouth 2 (two) times  daily. , Disp: , Rfl:  .  Biotin 5000 MCG CAPS, Take by mouth., Disp: , Rfl:  .  cholecalciferol (VITAMIN D3) 25 MCG (1000 UNIT) tablet, Take 2,000 Units by mouth daily., Disp: , Rfl:  .  Chromium Picolinate (CHROMIUM PICOLATE PO), Take by mouth., Disp: , Rfl:  .  clonazePAM (KLONOPIN) 0.5 MG tablet, Take 1 tablet (0.5 mg total) by mouth daily as needed for anxiety., Disp: 30 tablet, Rfl: 0 .  ezetimibe (ZETIA) 10 MG tablet, Take 1 tablet by mouth once daily, Disp: 90 tablet, Rfl: 0 .  metoprolol succinate (TOPROL XL) 25 MG 24 hr tablet, Take 1 tablet (25 mg total) by mouth daily., Disp: 90 tablet, Rfl: 3 .  modafinil (PROVIGIL) 100 MG tablet, TAKE ONE TABLET BY MOUTH EVERY MORNING, Disp: 30 tablet, Rfl: 3 .  MYRBETRIQ 25 MG TB24 tablet, Take 25 mg by mouth daily., Disp: , Rfl: 3 .  NONFORMULARY OR COMPOUNDED ITEM, Compounded progesterone SR 100mg .  1 capsule daily., Disp: 90 each, Rfl: 4 .  OXcarbazepine (TRILEPTAL) 150 MG tablet, TAKE 1 AND 1/2 TABLETS TWICE DAILY (Patient taking differently: Takes 1 tablet twice daily), Disp: 270 tablet, Rfl: 0 .  pregabalin (LYRICA) 50 MG capsule, Take 50 mg by mouth daily., Disp: , Rfl:  .  traZODone (DESYREL) 50 MG tablet, TAKE 1 TABLET BY MOUTH ONCE DAILY AT BEDTIME AS NEEDED FOR  INSOMNIA (Patient not taking: Reported on 02/03/2020), Disp: 30 tablet, Rfl: 0 .  venlafaxine XR (EFFEXOR-XR) 75 MG 24 hr capsule, Take 1 capsule (75 mg total) by mouth daily with breakfast., Disp: 90 capsule, Rfl: 1 .  ziprasidone (GEODON) 20 MG capsule, TAKE 1 CAPSULE AT BEDTIME, Disp: 90 capsule, Rfl:  0  Medication Side Effects: none  Family Medical/ Social History: Changes? No  MENTAL HEALTH EXAM:  Height 5' 5.25" (1.657 m), weight 197 lb (89.4 kg), last menstrual period 06/28/2008.Body mass index is 32.53 kg/m. Muscle strengths and tone 5/5, postural reflexes and gait 0/0, and AIMS = 0.  General Appearance: Casual, Fairly Groomed, Guarded, Meticulous and Obese  Eye Contact:  Good  Speech:  Clear and Coherent, Normal Rate and Talkative  Volume:  Normal  Mood:  Anxious, Dysphoric, Euthymic and Worthless  Affect:  Congruent, Depressed, Inappropriate, Labile, Restricted and Anxious  Thought Process:  Coherent, Goal Directed and Descriptions of Associations: Tangential  Orientation:  Full (Time, Place, and Person)  Thought Content: Logical, Ilusions, Rumination and Tangential   Suicidal Thoughts:  Yes.  without intent/plan as no reason or purpose to live sometimes  Homicidal Thoughts:  No  Memory:  Immediate;   Good and Fair Remote;   Good and Fair  Judgement:  Fair  Insight:  Fair  Psychomotor Activity:  Normal and Mannerisms  Concentration:  Concentration: Fair and Attention Span: Fair  Recall:  [employer] of Knowledge: Good  Language: Good  Assets:  Desire for Improvement Intimacy Resilience Talents/Skills  ADL's:  Intact  Cognition: WNL  Prognosis:  Poor    DIAGNOSES:    ICD-10-CM   1. Bipolar II disorder, moderate, depressed, with anxious distress ([place])  F31.81   2. Generalized anxiety disorder  F41.1 clonazePAM (KLONOPIN) 0.5 MG tablet  3. Circadian rhythm sleep disorder, unspecified type  G47.20   4. MS (multiple sclerosis) ([place])  G35     Receiving Psychotherapy: Yes Seeing Javier Glazier, [facility] at [facility] every 2 weeks now  RECOMMENDATIONS: Psychosupportive psychoeducation formulates current status of mood and anxiety disorders  in the setting of sleep regulation and multiple sclerosis morbidity.  She has made definite progress in the  interim describing a flight into health that she attributes to Effexor though may be more associated with restarting psychotherapy or answer to prayer.  She is at times able to stop thinking about Ludwig Clarks completely as he has wasted her disability money and not followed through with any of his promises, so that she may be better away from him but would find recovery of finances helpful.  Her supply is low so Klonopin is escribed to [facility] 0.5 mg daily as needed #30 no refill sent to the [employer]. [employer] for GAD.  Otherwise, she continues Geodon 20 mg every bedtime for bipolar disorder, Effexor 75 mg XR every morning after breakfast for bipolar depressed and generalized anxiety, trazodone 25 mg nightly if needed for insomnia taking rarely, Trileptal 150 mg as 1 to 1.5 tablets total 150 to 225 mg twice daily when taking for bipolar disorder, and Provigil 100 mg every morning as needed for circadian rhythm sleep disorder and multiple sclerosis having current supply expecting [employer] to notify office when updated supply needed.  She plans follow-up in 3 months or sooner if needed.  Delight Hoh, MD

## 2020-02-16 ENCOUNTER — Encounter: Payer: Self-pay | Admitting: Gastroenterology

## 2020-02-16 ENCOUNTER — Ambulatory Visit (AMBULATORY_SURGERY_CENTER): Payer: Medicare PPO | Admitting: Gastroenterology

## 2020-02-16 ENCOUNTER — Other Ambulatory Visit: Payer: Self-pay

## 2020-02-16 VITALS — BP 129/56 | HR 59 | Temp 96.2°F | Resp 12 | Ht 66.0 in | Wt 194.0 lb

## 2020-02-16 DIAGNOSIS — D124 Benign neoplasm of descending colon: Secondary | ICD-10-CM | POA: Diagnosis not present

## 2020-02-16 DIAGNOSIS — K635 Polyp of colon: Secondary | ICD-10-CM | POA: Diagnosis not present

## 2020-02-16 DIAGNOSIS — Z1211 Encounter for screening for malignant neoplasm of colon: Secondary | ICD-10-CM | POA: Diagnosis not present

## 2020-02-16 DIAGNOSIS — D123 Benign neoplasm of transverse colon: Secondary | ICD-10-CM

## 2020-02-16 MED ORDER — SODIUM CHLORIDE 0.9 % IV SOLN: 500.0000 mL | Freq: Once | INTRAVENOUS | Status: DC

## 2020-02-16 NOTE — Progress Notes (Signed)
PT taken to PACU. Monitors in place. VSS. Report given to RN. 

## 2020-02-16 NOTE — Patient Instructions (Signed)

## 2020-02-16 NOTE — Progress Notes (Signed)
CW vitals  ° °Pt's states no medical or surgical changes since previsit or office visit. °

## 2020-02-16 NOTE — Op Note (Signed)
[place] Patient Name: Sarah Hogan Procedure Date: 02/16/2020 10:22 AM MRN: [id number] Endoscopist: Mauri Pole , MD Age: 59 Referring MD:  Date of Birth: 06/20/1961 Gender: Female Account #: [id number] Procedure:                Colonoscopy Indications:              Screening for colorectal malignant neoplasm Medicines:                Monitored Anesthesia Care Procedure:                Pre-Anesthesia Assessment:                           - Prior to the procedure, a History and Physical                            was performed, and patient medications and                            allergies were reviewed. The patient's tolerance of                            previous anesthesia was also reviewed. The risks                            and benefits of the procedure and the sedation                            options and risks were discussed with the patient.                            All questions were answered, and informed consent                            was obtained. Prior Anticoagulants: The patient has                            taken no previous anticoagulant or antiplatelet                            agents. ASA Grade Assessment: III - A patient with                            severe systemic disease. After reviewing the risks                            and benefits, the patient was deemed in                            satisfactory condition to undergo the procedure.                           After obtaining informed consent, the colonoscope  was passed under direct vision. Throughout the                            procedure, the patient's blood pressure, pulse, and                            oxygen saturations were monitored continuously. The                            Colonoscope was introduced through the anus and                            advanced to the the cecum, identified by                            appendiceal orifice  and ileocecal valve. The                            colonoscopy was performed without difficulty. The                            patient tolerated the procedure well. The quality                            of the bowel preparation was excellent. The                            ileocecal valve, appendiceal orifice, and rectum                            were photographed. Scope In: 10:45:47 AM Scope Out: 11:20:48 AM Scope Withdrawal Time: 0 hours 16 minutes 57 seconds  Total Procedure Duration: 0 hours 35 minutes 1 second  Findings:                 The perianal and digital rectal examinations were                            normal.                           A less than 1 mm polyp was found in the transverse                            colon. The polyp was sessile. The polyp was removed                            with a cold biopsy forceps. Resection and retrieval                            were complete.                           A 4 mm polyp was found in the descending colon. The  polyp was sessile. The polyp was removed with a                            cold snare. Resection and retrieval were complete.                           A few small-mouthed diverticula were found in the                            sigmoid colon and descending colon.                           Non-bleeding internal hemorrhoids were found during                            retroflexion. The hemorrhoids were small. Complications:            No immediate complications. Estimated Blood Loss:     Estimated blood loss was minimal. Impression:               - One less than 1 mm polyp in the transverse colon,                            removed with a cold biopsy forceps. Resected and                            retrieved.                           - One 4 mm polyp in the descending colon, removed                            with a cold snare. Resected and retrieved.                           -  Diverticulosis in the sigmoid colon and in the                            descending colon.                           - Non-bleeding internal hemorrhoids. Recommendation:           - Patient has a contact number available for                            emergencies. The signs and symptoms of potential                            delayed complications were discussed with the                            patient. Return to normal activities tomorrow.  Written discharge instructions were provided to the                            patient.                           - Resume previous diet.                           - Continue present medications.                           - Await pathology results.                           - Repeat colonoscopy in 5-10 years for surveillance                            based on pathology results. Mauri Pole, MD 02/16/2020 11:25:44 AM This report has been signed electronically.

## 2020-02-18 ENCOUNTER — Telehealth: Payer: Self-pay | Admitting: *Deleted

## 2020-02-18 ENCOUNTER — Telehealth: Payer: Self-pay

## 2020-02-18 NOTE — Telephone Encounter (Signed)
  Follow up Call-  Call back number 02/16/2020  Post procedure Call Back phone  # [phone]  Permission to leave phone message Yes  Some recent data might be hidden     Patient questions:  Do you have a fever, pain , or abdominal swelling? No. Pain Score  0 *  Have you tolerated food without any problems? Yes.    Have you been able to return to your normal activities? Yes.    Do you have any questions about your discharge instructions: Diet   No. Medications  No. Follow up visit  No.  Do you have questions or concerns about your Care? No.  Actions: * If pain score is 4 or above: No action needed, pain <4.  1. Have you developed a fever since your procedure? no  2.   Have you had an respiratory symptoms (SOB or cough) since your procedure? no  3.   Have you tested positive for COVID 19 since your procedure no  4.   Have you had any family members/close contacts diagnosed with the COVID 19 since your procedure?  no   If yes to any of these questions please route to Joylene John, RN and Erenest Rasher, RN

## 2020-02-18 NOTE — Telephone Encounter (Signed)
Attempted to reach patient for post-procedure f/u call. No answer. Left message that we will make another attempt to reach her later today and for her to please not hesitate to call us if she has any questions/concerns regarding her care. 

## 2020-02-29 ENCOUNTER — Encounter: Payer: Self-pay | Admitting: Gastroenterology

## 2020-03-06 ENCOUNTER — Telehealth: Payer: Self-pay

## 2020-03-06 ENCOUNTER — Other Ambulatory Visit: Payer: Self-pay | Admitting: Psychiatry

## 2020-03-06 DIAGNOSIS — F3181 Bipolar II disorder: Secondary | ICD-10-CM

## 2020-03-06 MED ORDER — EZETIMIBE 10 MG PO TABS: 10.0000 mg | ORAL_TABLET | Freq: Every day | ORAL | 0 refills | Status: DC

## 2020-03-06 NOTE — Telephone Encounter (Signed)
Received fax for a refill on the pts. Ezetimibe and metoprolol pt. Last apt was 01/14/20 and 06/28/20

## 2020-03-06 NOTE — Telephone Encounter (Signed)
I have refilled zetia but we do not fill metoprolol. This comes from her cardiology. Please deny this

## 2020-03-07 DIAGNOSIS — D3132 Benign neoplasm of left choroid: Secondary | ICD-10-CM | POA: Diagnosis not present

## 2020-03-07 DIAGNOSIS — H2513 Age-related nuclear cataract, bilateral: Secondary | ICD-10-CM | POA: Diagnosis not present

## 2020-03-07 DIAGNOSIS — H43813 Vitreous degeneration, bilateral: Secondary | ICD-10-CM | POA: Diagnosis not present

## 2020-03-07 NOTE — Telephone Encounter (Signed)
Next apt 10/27

## 2020-03-13 ENCOUNTER — Telehealth: Payer: Self-pay | Admitting: Family Medicine

## 2020-03-13 MED ORDER — METOPROLOL SUCCINATE ER 25 MG PO TB24: 25.0000 mg | ORAL_TABLET | Freq: Every day | ORAL | 0 refills | Status: DC

## 2020-03-13 NOTE — Telephone Encounter (Signed)
Sent in refill

## 2020-03-13 NOTE — Telephone Encounter (Signed)
Laney with [facility] [phone] wants 90 day supply Metropolol

## 2020-03-24 ENCOUNTER — Ambulatory Visit
Admission: RE | Admit: 2020-03-24 | Discharge: 2020-03-24 | Disposition: A | Discharge status: Home / Self Care | Payer: Medicare PPO | Source: Ambulatory Visit | Attending: Family Medicine | Admitting: Family Medicine

## 2020-03-24 ENCOUNTER — Other Ambulatory Visit: Payer: Self-pay

## 2020-03-24 DIAGNOSIS — Z92241 Personal history of systemic steroid therapy: Secondary | ICD-10-CM

## 2020-03-24 DIAGNOSIS — E2839 Other primary ovarian failure: Secondary | ICD-10-CM

## 2020-03-24 DIAGNOSIS — M8588 Other specified disorders of bone density and structure, other site: Secondary | ICD-10-CM | POA: Diagnosis not present

## 2020-03-24 DIAGNOSIS — Z9189 Other specified personal risk factors, not elsewhere classified: Secondary | ICD-10-CM

## 2020-03-24 DIAGNOSIS — Z780 Asymptomatic menopausal state: Secondary | ICD-10-CM | POA: Diagnosis not present

## 2020-05-03 DIAGNOSIS — Z880 Allergy status to penicillin: Secondary | ICD-10-CM | POA: Diagnosis not present

## 2020-05-03 DIAGNOSIS — G35 Multiple sclerosis: Secondary | ICD-10-CM | POA: Diagnosis not present

## 2020-05-03 DIAGNOSIS — Z79899 Other long term (current) drug therapy: Secondary | ICD-10-CM | POA: Diagnosis not present

## 2020-05-09 ENCOUNTER — Encounter: Payer: Self-pay | Admitting: Cardiology

## 2020-05-09 ENCOUNTER — Other Ambulatory Visit: Payer: Self-pay

## 2020-05-09 ENCOUNTER — Ambulatory Visit: Payer: Medicare PPO | Admitting: Cardiology

## 2020-05-09 VITALS — BP 104/68 | HR 65 | Ht 66.0 in | Wt 196.4 lb

## 2020-05-09 DIAGNOSIS — I471 Supraventricular tachycardia: Secondary | ICD-10-CM | POA: Diagnosis not present

## 2020-05-09 DIAGNOSIS — I351 Nonrheumatic aortic (valve) insufficiency: Secondary | ICD-10-CM | POA: Diagnosis not present

## 2020-05-09 DIAGNOSIS — I493 Ventricular premature depolarization: Secondary | ICD-10-CM | POA: Diagnosis not present

## 2020-05-09 DIAGNOSIS — E7800 Pure hypercholesterolemia, unspecified: Secondary | ICD-10-CM | POA: Diagnosis not present

## 2020-05-09 NOTE — Progress Notes (Signed)
Cardiology Office Note:    Date:  05/09/2020   ID:  [name], DOB 1961-06-15, MRN [id number]  PCP:  Girtha Rm, NP-C  Cardiologist:  Fransico Him, MD    Referring MD: Girtha Rm, NP-C   Chief Complaint  Patient presents with  . Follow-up    SVT and HLD    History of Present Illness:    Sarah Hogan is a 59 y.o. adult with a hx of hyperlipidemia, MS, GERD, migraines, bipolar disorder, tobacco abuse with 40 pk year history of smoking and normal coronary arteries by cath for positive stress test done for fatigue in 2015.  She has a history SVT with PACs and PVCs on event monitor. Her echo 08/2016 showed normal LVF with ER 65-70% with mild AR and MR.  She is here today for followup and is doing well.  She denies any chest pain or pressure, SOB, DOE, PND, orthopnea, LE edema, dizziness, or syncope. Occasionally she will have some skipping of her heart beat but nothing sustained. She is compliant with her meds and is tolerating meds with no SE.    Past Medical History:  Diagnosis Date  . Allergy   . Anxiety   . Bipolar 2 disorder ([place])   . Cervical polyp 12/16/2018  . Depression   . Former smoker 12/16/2018  . Hyperlipidemia   . Leukopenia   . Mild aortic insufficiency    by echo 07/2016-- Leaky AV   . Mild mitral regurgitation    by echo 07/2016  . MS (multiple sclerosis) ([place])   . Neuromuscular disorder ([place])    MS   . Normal coronary arteries cath 2015  . Overactive bladder   . PVC's (premature ventricular contractions) 04/22/2017  . SVT (supraventricular tachycardia) ([place]) 04/22/2017  . Vitamin D deficiency     Past Surgical History:  Procedure Laterality Date  . BREAST BIOPSY Left 2002  . CARPAL TUNNEL RELEASE    . CHOLECYSTECTOMY    . COLONOSCOPY  2010  . 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    . pigmented squamous cell carcinoma removal    . POLYPECTOMY  2010   1 polyp- rectum , no path   . UPPER GASTROINTESTINAL ENDOSCOPY  2010    Current  Medications: Current Meds  Medication Sig  . baclofen (LIORESAL) 10 MG tablet Take 10 mg by mouth 2 (two) times daily.   . Biotin 5000 MCG CAPS Take by mouth.  . cholecalciferol (VITAMIN D3) 25 MCG (1000 UNIT) tablet Take 1,000 Units by mouth daily.  . Chromium Picolinate (CHROMIUM PICOLATE PO) Take by mouth.  . clonazePAM (KLONOPIN) 0.5 MG tablet Take 1 tablet (0.5 mg total) by mouth daily as needed for anxiety.  Marland Kitchen ezetimibe (ZETIA) 10 MG tablet Take 1 tablet (10 mg total) by mouth daily.  . metoprolol succinate (TOPROL XL) 25 MG 24 hr tablet Take 1 tablet (25 mg total) by mouth daily.  . modafinil (PROVIGIL) 100 MG tablet TAKE ONE TABLET BY MOUTH EVERY MORNING  . MYRBETRIQ 25 MG TB24 tablet Take 25 mg by mouth daily.  . NONFORMULARY OR COMPOUNDED ITEM Compounded progesterone SR 100mg .  1 capsule daily.  . OXcarbazepine (TRILEPTAL) 150 MG tablet TAKE 1 AND 1/2 TABLETS TWICE DAILY  . pregabalin (LYRICA) 50 MG capsule Take 50 mg by mouth daily.  . traZODone (DESYREL) 50 MG tablet TAKE 1 TABLET BY MOUTH ONCE DAILY AT BEDTIME AS NEEDED FOR  INSOMNIA  . venlafaxine XR (EFFEXOR-XR) 75 MG 24 hr  capsule Take 1 capsule (75 mg total) by mouth daily with breakfast.  . ziprasidone (GEODON) 20 MG capsule TAKE 1 CAPSULE AT BEDTIME     Allergies:   Amoxicillin   Social History   Socioeconomic History  . Marital status: Single    Spouse name: Not on file  . Number of children: Not on file  . Years of education: Not on file  . Highest education level: Not on file  Occupational History  . Not on file  Tobacco Use  . Smoking status: Former Smoker    Packs/day: 1.50    Years: 20.00    Pack years: 30.00    Types: Cigarettes    Quit date: 10/28/1994    Years since quitting: 25.5  . Smokeless tobacco: Never Used  Vaping Use  . Vaping Use: Never used  Substance and Sexual Activity  . Alcohol use: Yes    Alcohol/week: 0.0 - 1.0 standard drinks    Comment: occ  . Drug use: No  . Sexual activity:  Not Currently    Comment: since 2008  Other Topics Concern  . Not on file  Social History Narrative  . Not on file   Social Determinants of Health   Financial Resource Strain:   . Difficulty of Paying Living Expenses: Not on file  Food Insecurity:   . Worried About Charity fundraiser in the Last Year: Not on file  . Ran Out of Food in the Last Year: Not on file  Transportation Needs:   . Lack of Transportation (Medical): Not on file  . Lack of Transportation (Non-Medical): Not on file  Physical Activity:   . Days of Exercise per Week: Not on file  . Minutes of Exercise per Session: Not on file  Stress:   . Feeling of Stress : Not on file  Social Connections:   . Frequency of Communication with Friends and Family: Not on file  . Frequency of Social Gatherings with Friends and Family: Not on file  . Attends Religious Services: Not on file  . Active Member of Clubs or Organizations: Not on file  . Attends Archivist Meetings: Not on file  . Marital Status: Not on file     Family History: The patient's family history includes Bipolar disorder in her mother; Breast cancer in her paternal grandmother; Colon polyps in her brother; Dementia in her mother; Hyperlipidemia in her brother; Hypertension in her brother; Obesity in her brother; Skin cancer in her paternal grandmother; Stroke (age of onset: 108) in her mother. There is no history of Colon cancer, Esophageal cancer, Rectal cancer, or Stomach cancer.  ROS:   Please see the history of present illness.    ROS  All other systems reviewed and negative.   EKGs/Labs/Other Studies Reviewed:    The following studies were reviewed today: none  EKG:  EKG is  ordered today.  The ekg ordered today demonstrates NSR at 65bpm with no ST changes Recent Labs: 12/13/2019: ALT 10; Hemoglobin 14.1; Platelets 210; TSH 1.830 12/21/2019: BUN 11; Creatinine, Ser 1.07; Potassium 4.7; Sodium 144   Recent Lipid Panel    Component Value  Date/Time   CHOL 205 (H) 12/21/2019 1025   TRIG 83 12/21/2019 1025   HDL 74 12/21/2019 1025   CHOLHDL 2.8 12/21/2019 1025   CHOLHDL 4.2 02/26/2017 1126   VLDL 38 (H) 02/26/2017 1126   LDLCALC 116 (H) 12/21/2019 1025    Physical Exam:    VS:  BP 104/68  Pulse 65   Ht 5\' 6"  (1.676 m)   Wt 196 lb 6.4 oz (89.1 kg)   LMP 06/28/2008   SpO2 95%   BMI 31.70 kg/m     Wt Readings from Last 3 Encounters:  05/09/20 196 lb 6.4 oz (89.1 kg)  02/16/20 194 lb (88 kg)  02/03/20 194 lb (88 kg)    GEN: Well nourished, well developed in no acute distress HEENT: Normal NECK: No JVD; No carotid bruits LYMPHATICS: No lymphadenopathy CARDIAC:RRR, no murmurs, rubs, gallops RESPIRATORY:  Clear to auscultation without rales, wheezing or rhonchi  ABDOMEN: Soft, non-tender, non-distended MUSCULOSKELETAL:  No edema; No deformity  SKIN: Warm and dry NEUROLOGIC:  Alert and oriented x 3 PSYCHIATRIC:  Normal affect   ASSESSMENT:    1. SVT (supraventricular tachycardia) ([place])   2. PVC's (premature ventricular contractions)   3. Mild aortic insufficiency   4. Pure hypercholesterolemia    PLAN:    In order of problems listed above:  1.  SVT -she only rarely has some skipped beats -continuee Toprol XL 25mg  daily  2.  PVCs -palpitations are well controlled on BB -continue Toprol XL 25mg  qhs  3.  Mitral and aortic regurgitation -echo 10/2019 showed no MR and mild AR  4.  HLD -followed by PCP -[id number] in June 2021 -LDL goal < 130 -continue Zetia 10mg  daily - she is statin intolerant   Medication Adjustments/Labs and Tests Ordered: Current medicines are reviewed at length with the patient today.  Concerns regarding medicines are outlined above.  Orders Placed This Encounter  Procedures  . EKG 12-Lead   No orders of the defined types were placed in this encounter.   Signed, Fransico Him, MD  05/09/2020 11:21 AM    [place]

## 2020-05-09 NOTE — Patient Instructions (Signed)

## 2020-05-11 ENCOUNTER — Encounter: Payer: Self-pay | Admitting: Psychiatry

## 2020-05-15 ENCOUNTER — Other Ambulatory Visit: Payer: Self-pay | Admitting: Psychiatry

## 2020-05-15 DIAGNOSIS — F3130 Bipolar disorder, current episode depressed, mild or moderate severity, unspecified: Secondary | ICD-10-CM

## 2020-05-17 ENCOUNTER — Ambulatory Visit: Payer: Medicare PPO | Admitting: Psychiatry

## 2020-05-29 ENCOUNTER — Encounter: Payer: Self-pay | Admitting: Psychiatry

## 2020-05-29 ENCOUNTER — Other Ambulatory Visit: Payer: Self-pay

## 2020-05-29 ENCOUNTER — Ambulatory Visit (INDEPENDENT_AMBULATORY_CARE_PROVIDER_SITE_OTHER): Payer: Medicare PPO | Admitting: Psychiatry

## 2020-05-29 VITALS — Ht 65.25 in | Wt 198.0 lb

## 2020-05-29 DIAGNOSIS — F3181 Bipolar II disorder: Secondary | ICD-10-CM | POA: Diagnosis not present

## 2020-05-29 DIAGNOSIS — G35 Multiple sclerosis: Secondary | ICD-10-CM

## 2020-05-29 DIAGNOSIS — F3130 Bipolar disorder, current episode depressed, mild or moderate severity, unspecified: Secondary | ICD-10-CM

## 2020-05-29 DIAGNOSIS — F411 Generalized anxiety disorder: Secondary | ICD-10-CM | POA: Diagnosis not present

## 2020-05-29 DIAGNOSIS — G4720 Circadian rhythm sleep disorder, unspecified type: Secondary | ICD-10-CM | POA: Diagnosis not present

## 2020-05-29 MED ORDER — VENLAFAXINE HCL ER 75 MG PO CP24: 75.0000 mg | ORAL_CAPSULE | Freq: Every day | ORAL | 1 refills | Status: DC

## 2020-05-29 MED ORDER — ZIPRASIDONE HCL 20 MG PO CAPS: 20.0000 mg | ORAL_CAPSULE | Freq: Every day | ORAL | 1 refills | Status: DC

## 2020-05-29 MED ORDER — OXCARBAZEPINE 150 MG PO TABS: 150.0000 mg | ORAL_TABLET | Freq: Two times a day (BID) | ORAL | 1 refills | Status: DC

## 2020-05-29 NOTE — Progress Notes (Signed)
[facility]  Patient ID: Sarah Hogan,  MRN: [id number]  PCP: Girtha Rm, NP-C  Date of Evaluation: 05/29/2020 Time spent:25 minutes from 1100 to 1125  Chief Complaint:  Chief Complaint    Depression; Manic Behavior; Anxiety      HISTORY/CURRENT STATUS: Sarah Hogan is seen onsite in office 25 minutes face-to-face individually with consent with epic collateral for psychiatric interview and exam in 65-month evaluation and management of bipolar disorder, generalized anxiety, and circadian rhythm sleep disorder comorbid with multiple sclerosis.  Patient has been disabled for years by her multiple sclerosis becoming up-to-date in the interim seeing neurology, cardiology, and gastroenterology.  She feels secure with her general health maintenance and disease management being a month late for follow-up here due to quarantine herself after exposure to [place] that she did not contract.  In the interim, she has benefited from her psychotherapy at [facility] with Javier Glazier, [facility].  She particularly has disengaged from activities and relations requiring much money as she plans to sell her condo in 2 to 3 years in order to finance entry into a retirement community for a more economically intact future.  She has had no contact with anxiety who has taken her money and invested in his [place] and [place] trade now having little hope for return.  Medicines are still expensive though she finds them less expensive than in [place].  Brother helped her recently by phone when she put the wrong gas in her car.  She sees less often the relatives who referred her here but maintains contact with peer group particularly at the [place] and church.  Medications have not changed from cardiology since 2005, and neurology is primarily using Lyrica for pain and headaches, though she has modafinil for fatigue but rarely uses it any longer.  She received modafinil as a month supply and 3 refills in January  using only 1 refill thus far not wanting further supply at the pharmacy currently.  Similarly, her last fill of Klonopin was last January but she has a prescription at the pharmacy since July 27 not picked up.  She defers medications otherwise needing all to come through [facility], though occassionally arriving late such as recently she was out of the [place] for 10 days as [employer] had mixed up the time with one of her other medications.  She has decreased her Trileptal to 150 mg twice daily and continues her Effexor at 75 mg XR daily.  She has her chief concern today and that she is still too often experiencing some depression but that medication changes seem to increase cycling and manic symptoms choosing foremost to stay on the current medications while working hard in therapy.  She is off BuSpar and trazodone.  She processes my upcoming retirement and transfer transition in the office to advanced practitioner coping quite well with change now as therapy continues.  [name] prefers appointments here every 3 months stating she had appointments with her psychiatrist in [place] every 4 to 6 weeks. She has no current mania, suicidality, psychosis or delirium.  Depression The patient presents withstutteringlow level exacerbations ofrecurrentbipolar II depression without recenthypomaniabut with associated situational triggers. The onset quality is sudden. The problem occurs intermittently.The problem has beenwaxing and waningsince onset.Associated symptoms includesomatic and cognitive anxiety, excessiveworry,overthinkingdoubt,increased appetite for sugar,hypersomnia,irritability,apathy,fatigue,decreased concentration,decreased interest,headaches,and reactivedysphoria. Associated symptoms includenomotivationalchange,nohelplessness,no hopelessness, no guilt, no body aches, norestlessness,no paranoia, and no suicidal ideas.The symptoms are aggravated by  social issues and family issues.Past treatments include SSRIs - Selective serotonin  reuptake inhibitors, SNRIs - Serotonin and norepinephrine reuptake inhibitors, other medications andpreviouspsychotherapy.Compliance with treatment is good.Past compliance problems include medical issues, medication issues, difficulty with treatment plan and pharmacy issues.Previous treatment provided moderaterelief.Risk factors include a change in medication usage/dosage, family history, family history of mental illness, history of mental illness, menopause, major life event, a recent illness and stress. Past medical history includes chronic illness,recent illness,physical disability,brain trauma,anxiety,bipolar disorder,depression,and mental health disorder. Pertinent negatives include no life-threatening condition,no eating disorder,no post-traumatic stress disorder,no schizophrenia,and no suicide attempts.  Individual Medical History/ Review of Systems: Changes? :Yes Weight is up 1 pound in 4 months seeing neurology, cardiology, and gastroenterology since last appointment and GYN just before last appointment.  Allergies: Amoxicillin  Current Medications:  Current Outpatient Medications:  .  baclofen (LIORESAL) 10 MG tablet, Take 10 mg by mouth 2 (two) times daily. , Disp: , Rfl:  .  Biotin 5000 MCG CAPS, Take by mouth., Disp: , Rfl:  .  cholecalciferol (VITAMIN D3) 25 MCG (1000 UNIT) tablet, Take 1,000 Units by mouth daily., Disp: , Rfl:  .  Chromium Picolinate (CHROMIUM PICOLATE PO), Take by mouth., Disp: , Rfl:  .  clonazePAM (KLONOPIN) 0.5 MG tablet, Take 1 tablet (0.5 mg total) by mouth daily as needed for anxiety., Disp: 30 tablet, Rfl: 0 .  ezetimibe (ZETIA) 10 MG tablet, Take 1 tablet (10 mg total) by mouth daily., Disp: 90 tablet, Rfl: 0 .  metoprolol succinate (TOPROL XL) 25 MG 24 hr tablet, Take 1 tablet (25 mg total) by mouth daily., Disp: 90 tablet, Rfl: 0 .  modafinil  (PROVIGIL) 100 MG tablet, TAKE ONE TABLET BY MOUTH EVERY MORNING, Disp: 30 tablet, Rfl: 3 .  MYRBETRIQ 25 MG TB24 tablet, Take 25 mg by mouth daily., Disp: , Rfl: 3 .  NONFORMULARY OR COMPOUNDED ITEM, Compounded progesterone SR 100mg .  1 capsule daily., Disp: 90 each, Rfl: 4 .  OXcarbazepine (TRILEPTAL) 150 MG tablet, TAKE 1 AND 1/2 TABLETS TWICE DAILY, Disp: 270 tablet, Rfl: 0 .  pregabalin (LYRICA) 50 MG capsule, Take 50 mg by mouth daily., Disp: , Rfl:  .  traZODone (DESYREL) 50 MG tablet, TAKE 1 TABLET BY MOUTH ONCE DAILY AT BEDTIME AS NEEDED FOR  INSOMNIA, Disp: 30 tablet, Rfl: 0 .  venlafaxine XR (EFFEXOR-XR) 75 MG 24 hr capsule, Take 1 capsule (75 mg total) by mouth daily with breakfast., Disp: 90 capsule, Rfl: 1 .  ziprasidone (GEODON) 20 MG capsule, TAKE 1 CAPSULE AT BEDTIME, Disp: 90 capsule, Rfl: 0  Medication Side Effects: none  Family Medical/ Social History: Changes? No with previous history that father had alcohol use disorder and mother had bipolar, lupus and dementia.  Patient has brothers and [place] and [place].  MENTAL HEALTH EXAM:  Height 5' 5.25" (1.657 m), weight 198 lb (89.8 kg), last menstrual period 06/28/2008.Body mass index is 32.7 kg/m. AIMS = 0  General Appearance: Casual, Guarded, Meticulous, Well Groomed and Obese  Eye Contact:  Good  Speech:  Clear and Coherent, Normal Rate and Talkative  Volume:  Normal  Mood:  Anxious, Dysphoric and Euthymic  Affect:  Congruent, Depressed, Labile, Full Range and Anxious  Thought Process:  Coherent, Goal Directed and Descriptions of Associations: Tangential  Orientation:  Full (Time, Place, and Person)  Thought Content: Rumination and Tangential   Suicidal Thoughts:  No  Homicidal Thoughts:  No  Memory:  Immediate;   Good and Fair Remote;   Good and Fair  Judgement:  Fair  Insight:  Fair  Psychomotor Activity:  Normal and Mannerisms  Concentration:  Concentration: Fair and Attention Span: Fair  Recall:   [employer] of Knowledge: Good  Language: Good  Assets:  Desire for Improvement Intimacy Resilience Social Support Talents/Skills  ADL's:  Intact  Cognition: WNL  Prognosis:  Poor    DIAGNOSES:    ICD-10-CM   1. Bipolar II disorder, moderate, depressed, with anxious distress ([place])  F31.81   2. Generalized anxiety disorder  F41.1   3. Circadian rhythm sleep disorder, unspecified type  G47.20   4. MS (multiple sclerosis) ([place])  G35     Receiving Psychotherapy: Yes  at [facility] with Javier Glazier, [facility]  RECOMMENDATIONS: Patient suggests that she leaves the recognition of the need for refill up to [facility] but then documents that there were 10 days late on Geodon.  Bipolar medications are multiple although low dose is using much less of as needed medications and off BuSpar and trazodone while currently taking Provigil and Klonopin.  EScriptions are sent to [facility] to update care for the next 70-months she transition transfers with my imminent retirement case closure.  Geodon is escribed as 20 mg every bedtime sent as #9 refill to [facility] mail service for bipolar disorder.  Effexor 75 mg XR capsule every morning with breakfast is E scribed as #90 with 1 refill to [facility] mail service for bipolar and generalized anxiety.  Trileptal is E scribed 150 mg twice daily sent as #180 with 1 refill to [facility] mail service for bipolar disorder.  She has current supply of Provigil 100 mg every morning and Klonopin 0.5 mg daily both as needed last dispensing in January still having sufficient supply for circadian rhythm sleep disorder and generalized anxiety.  She will see advanced practitioner in 3 months for follow-up or return sooner if needed.  She is updated and well educated on prevention and monitoring safety hygiene for diagnoses and medications.   Delight Hoh, MD

## 2020-06-28 ENCOUNTER — Encounter: Payer: Medicare PPO | Admitting: Family Medicine

## 2020-07-07 ENCOUNTER — Encounter: Payer: Medicare PPO | Admitting: Family Medicine

## 2020-07-24 ENCOUNTER — Other Ambulatory Visit: Payer: Self-pay | Admitting: Family Medicine

## 2020-08-29 ENCOUNTER — Encounter: Payer: Self-pay | Admitting: Psychiatry

## 2020-08-29 ENCOUNTER — Ambulatory Visit (INDEPENDENT_AMBULATORY_CARE_PROVIDER_SITE_OTHER): Payer: Medicare PPO | Admitting: Psychiatry

## 2020-08-29 ENCOUNTER — Other Ambulatory Visit: Payer: Self-pay

## 2020-08-29 DIAGNOSIS — F411 Generalized anxiety disorder: Secondary | ICD-10-CM

## 2020-08-29 DIAGNOSIS — F3181 Bipolar II disorder: Secondary | ICD-10-CM

## 2020-08-29 MED ORDER — CLONAZEPAM 0.5 MG PO TABS: 0.5000 mg | ORAL_TABLET | Freq: Every day | ORAL | 2 refills | Status: DC | PRN

## 2020-08-29 MED ORDER — ZIPRASIDONE HCL 20 MG PO CAPS: 20.0000 mg | ORAL_CAPSULE | Freq: Two times a day (BID) | ORAL | 1 refills | Status: DC

## 2020-08-29 NOTE — Progress Notes (Signed)
   08/29/20 1151  Facial and Oral Movements  Muscles of Facial Expression 0  Lips and Perioral Area 0  Jaw 0  Tongue 0  Extremity Movements  Upper (arms, wrists, hands, fingers) 1  Lower (legs, knees, ankles, toes) 0  Trunk Movements  Neck, shoulders, hips 0  Overall Severity  Severity of abnormal movements (highest score from questions above) 0  Incapacitation due to abnormal movements 0  Patient's awareness of abnormal movements (rate only patient's report) 0  AIMS Total Score  AIMS Total Score 1

## 2020-08-29 NOTE — Progress Notes (Signed)
Vanissa Strength [id number] 27-Nov-1960 60 y.o.  Subjective:   Patient ID:  Sarah Hogan is a 60 y.o. (DOB 11-17-1960) adult.  Chief Complaint:  Chief Complaint  Patient presents with  . Depression  . Anxiety    HPI Sarah Hogan presents to the office today for follow-up of mood disturbance and anxiety. Pt previously seen by Dr. Creig Hines and care is being transferred to this provider due to Dr. Creig Hines' retirement.  She reports, "I have been in a depression for over a year." She reports that it will improve for awhile and then will worsen again. Mood has been depressed. Energy and motivation have been low. She reports that she has been "serial reading." She would like to start exercising again. Appetite has been good. She reports that she has binge eating at times on sugar. Denies SI.   She reports high anxiety. She reports that she has anxiety with watching the news or hearing people talk about current events and will feel tightness in her chest. She reports that she has worry about the future of the country, her nieces and nephews. She reports that she had intrusive thoughts in the past and Buspar has helped with this. She reports that Klonopin has been helpful for her sleep as needed. She reports that other sleeping medications have caused her to feel groggy. Sleep has been ok now.   She reports that she will increase her Trileptal and will take Klonopin prn more consistently if she has mania. She reports that manic episodes are "few and far between" and has more depression than mania.   Has MS and was dx'd in 1990. MS is now stable. She reports that she has difficulty with memory and reports that she will get confused about appointments. She reports that she is able to care for herself and her animals. She has Bipolar Depression.   She has an apt to see a new therapist tomorrow.   Her dog died in her lap last week. "I'm doing ok... I miss him." Has 3 nieces and 2 nephews, including one that  was born 102 weeks ago. Reports that she was scammed and took a significant financial hit.   Saw a therapist for 6 months and this was not helpful. Lived in [place].   [name] been helpful for her depression. Effexor- Taken for several years Buspar Trileptal Tranxene Provigil Trazodone  AIMS   Flowsheet Row Office Visit from 08/29/2020 in [facility] Total Score 1    PHQ2-9   [place] from 12/21/2019 in [facility] from 12/16/2018 in [facility] from 12/10/2017 in [facility] Visit from 02/26/2017 in [facility]  PHQ-2 Total Score 0 0 6 6  PHQ-9 Total Score -- -- 19 21       Review of Systems:  Review of Systems  Musculoskeletal: Positive for arthralgias. Negative for gait problem.  Neurological: Negative for tremors.  Psychiatric/Behavioral:       Please refer to HPI    Medications: I have reviewed the patient's current medications.  Current Outpatient Medications  Medication Sig Dispense Refill  . baclofen (LIORESAL) 10 MG tablet Take 10 mg by mouth 2 (two) times daily.     . cholecalciferol (VITAMIN D3) 25 MCG (1000 UNIT) tablet Take 1,000 Units by mouth daily.    . Chromium Picolinate (CHROMIUM PICOLATE PO) Take by mouth.    . ezetimibe (ZETIA) 10 MG tablet TAKE 1 TABLET EVERY  DAY 90 tablet 0  . metoprolol succinate (TOPROL-XL) 25 MG 24 hr tablet TAKE 1 TABLET EVERY DAY 90 tablet 0  . modafinil (PROVIGIL) 100 MG tablet TAKE ONE TABLET BY MOUTH EVERY MORNING (Patient taking differently: Take 100 mg by mouth daily as needed.) 30 tablet 3  . MYRBETRIQ 25 MG TB24 tablet Take 25 mg by mouth daily.  3  . NONFORMULARY OR COMPOUNDED ITEM Compounded progesterone SR 100mg .  1 capsule daily. 90 each 4  . OXcarbazepine (TRILEPTAL) 150 MG tablet Take 1 tablet (150 mg total) by mouth 2 (two) times daily. 180 tablet 1  . pregabalin (LYRICA) 50 MG capsule Take  50 mg by mouth daily.    Marland Kitchen venlafaxine XR (EFFEXOR-XR) 75 MG 24 hr capsule Take 1 capsule (75 mg total) by mouth daily with breakfast. 90 capsule 1  . Biotin 5000 MCG CAPS Take by mouth. (Patient not taking: Reported on 08/29/2020)    . clonazePAM (KLONOPIN) 0.5 MG tablet Take 1 tablet (0.5 mg total) by mouth daily as needed for anxiety. 30 tablet 2  . ziprasidone (GEODON) 20 MG capsule Take 1 capsule (20 mg total) by mouth 2 (two) times daily with a meal. 180 capsule 1   No current facility-administered medications for this visit.    Medication Side Effects: None  Allergies:  Allergies  Allergen Reactions  . Amoxicillin Rash    Past Medical History:  Diagnosis Date  . Allergy   . Anxiety   . Bipolar 2 disorder ([place])   . Cervical polyp 12/16/2018  . Depression   . Former smoker 12/16/2018  . Hyperlipidemia   . Leukopenia   . Mild aortic insufficiency    by echo 07/2016-- Leaky AV   . Mild mitral regurgitation    by echo 07/2016  . MS (multiple sclerosis) ([place])   . Neuromuscular disorder ([place])    MS   . Normal coronary arteries cath 2015  . Overactive bladder   . PVC's (premature ventricular contractions) 04/22/2017  . SVT (supraventricular tachycardia) ([place]) 04/22/2017  . Vitamin D deficiency     Family History  Problem Relation Age of Onset  . Stroke Mother 64  . Dementia Mother   . Bipolar disorder Mother   . Hypertension Brother   . Hyperlipidemia Brother   . Obesity Brother   . Breast cancer Paternal Grandmother        over 54  . Skin cancer Paternal Grandmother   . Colon polyps Brother   . Colon cancer Neg Hx   . Esophageal cancer Neg Hx   . Rectal cancer Neg Hx   . Stomach cancer Neg Hx     Social History   Socioeconomic History  . Marital status: Single    Spouse name: Not on file  . Number of children: Not on file  . Years of education: Not on file  . Highest education level: Not on file  Occupational History  . Not on file  Tobacco Use  . Smoking  status: Former Smoker    Packs/day: 1.50    Years: 20.00    Pack years: 30.00    Types: Cigarettes    Quit date: 10/28/1994    Years since quitting: 25.8  . Smokeless tobacco: Never Used  Vaping Use  . Vaping Use: Never used  Substance and Sexual Activity  . Alcohol use: Yes    Alcohol/week: 0.0 - 1.0 standard drinks    Comment: occ  . Drug use: No  . Sexual activity:  Not Currently    Comment: since 2008  Other Topics Concern  . Not on file  Social History Narrative  . Not on file   Social Determinants of Health   Financial Resource Strain: Not on file  Food Insecurity: Not on file  Transportation Needs: Not on file  Physical Activity: Not on file  Stress: Not on file  Social Connections: Not on file  Intimate Partner Violence: Not on file    Past Medical History, Surgical history, Social history, and Family history were reviewed and updated as appropriate.   Please see review of systems for further details on the patient's review from today.   Objective:   Physical Exam:  LMP 06/28/2008   Physical Exam Constitutional:      General: She is not in acute distress. Musculoskeletal:        General: No deformity.  Neurological:     Mental Status: She is alert and oriented to person, place, and time.     Coordination: Coordination normal.  Psychiatric:        Attention and Perception: Attention and perception normal. She does not perceive auditory or visual hallucinations.        Mood and Affect: Mood is anxious and depressed. Affect is not labile, blunt, angry or inappropriate.        Speech: Speech normal.        Behavior: Behavior normal.        Thought Content: Thought content normal. Thought content is not paranoid or delusional. Thought content does not include homicidal or suicidal ideation. Thought content does not include homicidal or suicidal plan.        Cognition and Memory: Cognition and memory normal.        Judgment: Judgment normal.     Comments:  Insight intact     Lab Review:     Component Value Date/Time   NA 144 12/21/2019 1027   K 4.7 12/21/2019 1027   CL 106 12/21/2019 1027   CO2 23 12/21/2019 1027   GLUCOSE 90 12/21/2019 1027   GLUCOSE 87 02/26/2017 1126   BUN 11 12/21/2019 1027   CREATININE 1.07 (H) 12/21/2019 1027   CREATININE 1.13 (H) 02/26/2017 1126   CALCIUM 9.6 12/21/2019 1027   PROT 6.6 12/13/2019 1519   ALBUMIN 4.4 12/13/2019 1519   AST 15 12/13/2019 1519   ALT 10 12/13/2019 1519   ALKPHOS 70 12/13/2019 1519   BILITOT 0.2 12/13/2019 1519   GFRNONAA 57 (L) 12/21/2019 1027   GFRAA 66 12/21/2019 1027       Component Value Date/Time   WBC 4.6 12/13/2019 1519   WBC 2.2 (L) 02/26/2017 1126   RBC 4.94 12/13/2019 1519   RBC 4.41 02/26/2017 1126   HGB 14.1 12/13/2019 1519   HCT 42.4 12/13/2019 1519   PLT 210 12/13/2019 1519   MCV 86 12/13/2019 1519   MCH 28.5 12/13/2019 1519   MCH 28.3 02/26/2017 1126   MCHC 33.3 12/13/2019 1519   MCHC 32.7 02/26/2017 1126   RDW 12.7 12/13/2019 1519   LYMPHSABS 1.1 12/13/2019 1519   MONOABS 220 02/26/2017 1126   EOSABS 0.1 12/13/2019 1519   BASOSABS 0.0 12/13/2019 1519    No results found for: POCLITH, LITHIUM   No results found for: PHENYTOIN, PHENOBARB, VALPROATE, CBMZ   .res Assessment: Plan:   Pt seen for 30 minutes and time spent reviewing patient's history and discussing possible treatment options for mood signs and symptoms.  Discussed potential benefits, risks, and side  effects of increasing Geodon to twice daily to possibly improve mood since she reports that Geodon has been the most effective medication for her mood signs and symptoms.  Patient agrees to dose increase. Continue Klonopin as needed for anxiety and insomnia. Continue modafinil as needed for energy and concentration. Continue Trileptal 150 mg twice daily for mood stabilization. Patient to follow-up in 2 months or sooner if clinically indicated. Patient advised to contact office with any  questions, adverse effects, or acute worsening in signs and symptoms.  Grasiela was seen today for depression and anxiety.  Diagnoses and all orders for this visit:  Bipolar II disorder, moderate, depressed, with anxious distress ([place]) -     ziprasidone (GEODON) 20 MG capsule; Take 1 capsule (20 mg total) by mouth 2 (two) times daily with a meal.  Generalized anxiety disorder -     clonazePAM (KLONOPIN) 0.5 MG tablet; Take 1 tablet (0.5 mg total) by mouth daily as needed for anxiety.     Please see After Visit Summary for patient specific instructions.  Future Appointments  Date Time Provider Victor  10/24/2020 11:00 AM Thayer Headings, PMHNP [facility] None    No orders of the defined types were placed in this encounter.   -------------------------------

## 2020-08-30 DIAGNOSIS — F319 Bipolar disorder, unspecified: Secondary | ICD-10-CM | POA: Diagnosis not present

## 2020-08-31 DIAGNOSIS — Z0289 Encounter for other administrative examinations: Secondary | ICD-10-CM

## 2020-09-13 DIAGNOSIS — F319 Bipolar disorder, unspecified: Secondary | ICD-10-CM | POA: Diagnosis not present

## 2020-09-26 DIAGNOSIS — N3941 Urge incontinence: Secondary | ICD-10-CM | POA: Diagnosis not present

## 2020-09-26 DIAGNOSIS — N949 Unspecified condition associated with female genital organs and menstrual cycle: Secondary | ICD-10-CM | POA: Diagnosis not present

## 2020-09-26 DIAGNOSIS — G35 Multiple sclerosis: Secondary | ICD-10-CM | POA: Diagnosis not present

## 2020-09-28 DIAGNOSIS — F319 Bipolar disorder, unspecified: Secondary | ICD-10-CM | POA: Diagnosis not present

## 2020-10-17 ENCOUNTER — Other Ambulatory Visit: Payer: Self-pay | Admitting: Family Medicine

## 2020-10-17 DIAGNOSIS — F319 Bipolar disorder, unspecified: Secondary | ICD-10-CM | POA: Diagnosis not present

## 2020-10-17 NOTE — Telephone Encounter (Signed)
Pt has appt in june 

## 2020-10-24 ENCOUNTER — Other Ambulatory Visit: Payer: Self-pay

## 2020-10-24 ENCOUNTER — Ambulatory Visit (INDEPENDENT_AMBULATORY_CARE_PROVIDER_SITE_OTHER): Payer: Medicare PPO | Admitting: Psychiatry

## 2020-10-24 ENCOUNTER — Encounter: Payer: Self-pay | Admitting: Psychiatry

## 2020-10-24 DIAGNOSIS — F3181 Bipolar II disorder: Secondary | ICD-10-CM | POA: Diagnosis not present

## 2020-10-24 DIAGNOSIS — F411 Generalized anxiety disorder: Secondary | ICD-10-CM

## 2020-10-24 MED ORDER — OXCARBAZEPINE 150 MG PO TABS: 150.0000 mg | ORAL_TABLET | Freq: Two times a day (BID) | ORAL | 1 refills | Status: DC

## 2020-10-24 MED ORDER — VENLAFAXINE HCL ER 75 MG PO CP24: 75.0000 mg | ORAL_CAPSULE | Freq: Every day | ORAL | 1 refills | Status: DC

## 2020-10-24 MED ORDER — ZIPRASIDONE HCL 20 MG PO CAPS: 40.0000 mg | ORAL_CAPSULE | Freq: Every day | ORAL | 1 refills | Status: DC

## 2020-10-24 NOTE — Progress Notes (Signed)
Sarah Hogan [id number] April 16, 1961 60 y.o.  Subjective:   Patient ID:  Sarah Hogan is a 60 y.o. (DOB Feb 07, 1961) adult.  Chief Complaint:  Chief Complaint  Patient presents with  . Follow-up    H/o mood disturbance    HPI Sarah Hogan presents to the office today for follow-up of mood disturbance. She reports that she has been doing well with Geodon increased and that depression has improved. She reports that she has also started exercising and this has been helpful for her. She is doing water aerobics 3 times a week. She reports that she is also walking regularly. She reports that motivation is low for cleaning her house. She reports that her mood has been "up" and feels more optimistic. Denies manic s/s to include impulsivity or risky behavior. Energy has been lower due to increased activity. She reports that she is sleeping well with Geodon at [facility] and is getting 8 hours of sleep. Has been waking up earlier. Appetite has been stable. She is trying to control cravings for sugar. She reports difficulty with concentration and focus. She reports chronic difficulty with concentration due to MS. Denies SI.   She is signed up for a Walk for Life and is working towards goals.   Has not needed Klonopin prn recently.   She is fostering a 60 lb dog.   She has found a new counselor at [place], and seeing her every 2 weeks. Bible study on Thursday. She has been doing appointments on Tuesdays. Volunteered at [employer].   [place] Office Visit from 10/24/2020 in [place] Office Visit from 08/29/2020 in [place] Total Score 1 1    PHQ2-9   Flowsheet Row Clinical Support from 12/21/2019 in [place] from 12/16/2018 in [place] from 12/10/2017 in [place] Visit from 02/26/2017 in [place]  PHQ-2 Total Score 0 0 6 6  PHQ-9 Total Score  -- -- 19 21       Review of Systems:  Review of Systems  Constitutional: Positive for fatigue.  HENT: Negative for dental problem.   Musculoskeletal: Negative for gait problem.  Psychiatric/Behavioral:       Please refer to HPI    Medications: I have reviewed the patient's current medications.  Current Outpatient Medications  Medication Sig Dispense Refill  . baclofen (LIORESAL) 10 MG tablet Take 20 mg by mouth 2 (two) times daily.    . cholecalciferol (VITAMIN D3) 25 MCG (1000 UNIT) tablet Take 1,000 Units by mouth daily.    . Chromium Picolinate (CHROMIUM PICOLATE PO) Take by mouth.    . clonazePAM (KLONOPIN) 0.5 MG tablet Take 1 tablet (0.5 mg total) by mouth daily as needed for anxiety. 30 tablet 2  . ezetimibe (ZETIA) 10 MG tablet TAKE 1 TABLET EVERY DAY 90 tablet 0  . metoprolol succinate (TOPROL-XL) 25 MG 24 hr tablet TAKE 1 TABLET EVERY DAY 90 tablet 0  . modafinil (PROVIGIL) 100 MG tablet TAKE ONE TABLET BY MOUTH EVERY MORNING (Patient taking differently: Take 100 mg by mouth daily as needed.) 30 tablet 3  . MYRBETRIQ 25 MG TB24 tablet Take 25 mg by mouth daily.  3  . NONFORMULARY OR COMPOUNDED ITEM Compounded progesterone SR 100mg .  1 capsule daily. 90 each 4  . pregabalin (LYRICA) 50 MG capsule Take 2 capsules (100 mg total) by mouth 2 (two) times daily.    . Biotin 5000  MCG CAPS Take by mouth. (Patient not taking: No sig reported)    . OXcarbazepine (TRILEPTAL) 150 MG tablet Take 1 tablet (150 mg total) by mouth 2 (two) times daily. 180 tablet 1  . venlafaxine XR (EFFEXOR-XR) 75 MG 24 hr capsule Take 1 capsule (75 mg total) by mouth daily with breakfast. 90 capsule 1  . ziprasidone (GEODON) 20 MG capsule Take 2 capsules (40 mg total) by mouth at bedtime. 180 capsule 1   No current facility-administered medications for this visit.    Medication Side Effects: None  Allergies:  Allergies  Allergen Reactions  . Amoxicillin Rash    Past Medical History:  Diagnosis  Date  . Allergy   . Anxiety   . Bipolar 2 disorder ([place])   . Cervical polyp 12/16/2018  . Depression   . Former smoker 12/16/2018  . Hyperlipidemia   . Leukopenia   . Mild aortic insufficiency    by echo 07/2016-- Leaky AV   . Mild mitral regurgitation    by echo 07/2016  . MS (multiple sclerosis) ([place])   . Neuromuscular disorder ([place])    MS   . Normal coronary arteries cath 2015  . Overactive bladder   . PVC's (premature ventricular contractions) 04/22/2017  . SVT (supraventricular tachycardia) ([place]) 04/22/2017  . Vitamin D deficiency     Family History  Problem Relation Age of Onset  . Stroke Mother 21  . Dementia Mother   . Bipolar disorder Mother   . Hypertension Brother   . Hyperlipidemia Brother   . Obesity Brother   . Breast cancer Paternal Grandmother        over 42  . Skin cancer Paternal Grandmother   . Colon polyps Brother   . Colon cancer Neg Hx   . Esophageal cancer Neg Hx   . Rectal cancer Neg Hx   . Stomach cancer Neg Hx     Social History   Socioeconomic History  . Marital status: Single    Spouse name: Not on file  . Number of children: Not on file  . Years of education: Not on file  . Highest education level: Not on file  Occupational History  . Not on file  Tobacco Use  . Smoking status: Former Smoker    Packs/day: 1.50    Years: 20.00    Pack years: 30.00    Types: Cigarettes    Quit date: 10/28/1994    Years since quitting: 26.0  . Smokeless tobacco: Never Used  Vaping Use  . Vaping Use: Never used  Substance and Sexual Activity  . Alcohol use: Yes    Alcohol/week: 0.0 - 1.0 standard drinks    Comment: occ  . Drug use: No  . Sexual activity: Not Currently    Comment: since 2008  Other Topics Concern  . Not on file  Social History Narrative  . Not on file   Social Determinants of Health   Financial Resource Strain: Not on file  Food Insecurity: Not on file  Transportation Needs: Not on file  Physical Activity: Not on file   Stress: Not on file  Social Connections: Not on file  Intimate Partner Violence: Not on file    Past Medical History, Surgical history, Social history, and Family history were reviewed and updated as appropriate.   Please see review of systems for further details on the patient's review from today.   Objective:   Physical Exam:  LMP 06/28/2008   Physical Exam Constitutional:  General: She is not in acute distress. Musculoskeletal:        General: No deformity.  Neurological:     Mental Status: She is alert and oriented to person, place, and time.     Coordination: Coordination normal.  Psychiatric:        Attention and Perception: Attention and perception normal. She does not perceive auditory or visual hallucinations.        Mood and Affect: Mood normal. Mood is not anxious or depressed. Affect is not labile, blunt, angry or inappropriate.        Speech: Speech normal.        Behavior: Behavior normal.        Thought Content: Thought content normal. Thought content is not paranoid or delusional. Thought content does not include homicidal or suicidal ideation. Thought content does not include homicidal or suicidal plan.        Cognition and Memory: Cognition and memory normal.        Judgment: Judgment normal.     Comments: Insight intact     Lab Review:     Component Value Date/Time   NA 144 12/21/2019 1027   K 4.7 12/21/2019 1027   CL 106 12/21/2019 1027   CO2 23 12/21/2019 1027   GLUCOSE 90 12/21/2019 1027   GLUCOSE 87 02/26/2017 1126   BUN 11 12/21/2019 1027   CREATININE 1.07 (H) 12/21/2019 1027   CREATININE 1.13 (H) 02/26/2017 1126   CALCIUM 9.6 12/21/2019 1027   PROT 6.6 12/13/2019 1519   ALBUMIN 4.4 12/13/2019 1519   AST 15 12/13/2019 1519   ALT 10 12/13/2019 1519   ALKPHOS 70 12/13/2019 1519   BILITOT 0.2 12/13/2019 1519   GFRNONAA 57 (L) 12/21/2019 1027   GFRAA 66 12/21/2019 1027       Component Value Date/Time   WBC 4.6 12/13/2019 1519   WBC  2.2 (L) 02/26/2017 1126   RBC 4.94 12/13/2019 1519   RBC 4.41 02/26/2017 1126   HGB 14.1 12/13/2019 1519   HCT 42.4 12/13/2019 1519   PLT 210 12/13/2019 1519   MCV 86 12/13/2019 1519   MCH 28.5 12/13/2019 1519   MCH 28.3 02/26/2017 1126   MCHC 33.3 12/13/2019 1519   MCHC 32.7 02/26/2017 1126   RDW 12.7 12/13/2019 1519   LYMPHSABS 1.1 12/13/2019 1519   MONOABS 220 02/26/2017 1126   EOSABS 0.1 12/13/2019 1519   BASOSABS 0.0 12/13/2019 1519    No results found for: POCLITH, LITHIUM   No results found for: PHENYTOIN, PHENOBARB, VALPROATE, CBMZ   .res Assessment: Plan:   Will continue current plan of care since target signs and symptoms are well controlled without any tolerability issues. Patient reports that she would prefer to continue current medications without changes at this time.  She reports that she may wish to attempt dose reduction in Geodon in the future. We will continue Geodon 40 mg at bedtime for mood signs and symptoms. Continue Effexor XR 75 mg daily for mood and anxiety signs and symptoms. Continue Trileptal 150 mg twice daily for mood and anxiety signs and symptoms. Patient to follow-up with this provider in 3 months or sooner if clinically indicated. Patient advised to contact office with any questions, adverse effects, or acute worsening in signs and symptoms.  Sarah Hogan was seen today for follow-up.  Diagnoses and all orders for this visit:  Bipolar II disorder, moderate, depressed, with anxious distress ([place]) -     ziprasidone (GEODON) 20 MG capsule; Take 2  capsules (40 mg total) by mouth at bedtime. -     venlafaxine XR (EFFEXOR-XR) 75 MG 24 hr capsule; Take 1 capsule (75 mg total) by mouth daily with breakfast. -     OXcarbazepine (TRILEPTAL) 150 MG tablet; Take 1 tablet (150 mg total) by mouth 2 (two) times daily.  Generalized anxiety disorder -     venlafaxine XR (EFFEXOR-XR) 75 MG 24 hr capsule; Take 1 capsule (75 mg total) by mouth daily with  breakfast.     Please see After Visit Summary for patient specific instructions.  Future Appointments  Date Time Provider [place]  12/21/2020 11:15 AM Girtha Rm, NP-C P[facility]  01/23/2021 11:00 AM Thayer Headings, PMHNP CP-CP None    No orders of the defined types were placed in this encounter.   -------------------------------

## 2020-10-24 NOTE — Progress Notes (Signed)
   10/24/20 1112  Facial and Oral Movements  Muscles of Facial Expression 0  Lips and Perioral Area 1 (Pt has MS and reports this is long-standing)  Jaw 0  Tongue 0  Extremity Movements  Upper (arms, wrists, hands, fingers) 0  Lower (legs, knees, ankles, toes) 0  Trunk Movements  Neck, shoulders, hips 0  Overall Severity  Severity of abnormal movements (highest score from questions above) 0  Incapacitation due to abnormal movements 0  Patient's awareness of abnormal movements (rate only patient's report) 0  Dental Status  Current problems with teeth and/or dentures? No  AIMS Total Score  AIMS Total Score 1

## 2020-11-14 DIAGNOSIS — F319 Bipolar disorder, unspecified: Secondary | ICD-10-CM | POA: Diagnosis not present

## 2020-12-14 ENCOUNTER — Other Ambulatory Visit: Payer: Self-pay

## 2020-12-14 ENCOUNTER — Other Ambulatory Visit: Payer: Self-pay | Admitting: Obstetrics & Gynecology

## 2020-12-14 ENCOUNTER — Ambulatory Visit
Admission: RE | Admit: 2020-12-14 | Discharge: 2020-12-14 | Disposition: A | Discharge status: Home / Self Care | Payer: Medicare PPO | Source: Ambulatory Visit | Attending: Obstetrics & Gynecology | Admitting: Obstetrics & Gynecology

## 2020-12-14 DIAGNOSIS — Z1231 Encounter for screening mammogram for malignant neoplasm of breast: Secondary | ICD-10-CM | POA: Diagnosis not present

## 2020-12-20 NOTE — Progress Notes (Signed)
Sarah Hogan is a 60 y.o. female who presents for annual wellness visit, CPE and follow-up on chronic medical conditions.  She has the following concerns:  She is under the care of several specialists.  States she has not felt this well in a long time.  Denies any concerns.  States she is taking all of her medications which is treating her symptoms  HL- Statin intolerant- states statins made her legs swell and she had leg pain.  Reports taking Zetia daily. States she did tolerate Pravastatin and CoQ10.   Steroid therapy in past for MS. Daily use stopped in 2007.   Vitamin D deficiency in the past. Is taking 5,000 IUs daily   MS managed by neurologist.   She had J and J and then a J and J booster on 06/06/2020   Immunization History  Administered Date(s) Administered  . Influenza Split 04/04/2013, 07/22/2013  . Influenza, Seasonal, Injecte, Preservative Fre 04/20/2009  . Influenza,inj,Quad PF,6+ Mos 03/26/2018, 04/19/2019  . Influenza,inj,quad, With Preservative 07/22/2014  . Influenza-Unspecified 07/22/2013, 05/21/2017, 03/26/2018  . Janssen (J&J) SARS-COV-2 Vaccination 10/09/2019  . Tdap 07/22/2000, 01/12/2019  . Zoster Recombinat (Shingrix) 02/02/2019, 04/05/2019   Last Pap smear: due July with Dr. Sabra Heck Last mammogram: 11/2020 Last colonoscopy: 01/2020 and due for recall in 5 years  Last DEXA: 2021  Dentist: Dr. Oleta Mouse Ophtho:  Dr. Katy Fitch  Exercise: water aerobics 3 times a week. Walking a dog  [employer] for diet   Other doctors caring for patient include: OB/GYN-  Dr. Sabra Heck Psychiatrist- Thayer Headings at [facility] Neurologist -Dr. George Hugh Cardiologist- Dr. Radford Pax Urogynoecology- Candace Tyson Babinski - bladder control Dermatologist- Dr. Renda Rolls   Depression screen:  See questionnaire below.  Depression screen [facility] 2/9 12/21/2020 12/21/2019 12/16/2018 12/10/2017 02/26/2017  Decreased Interest 0 0 0 3 3  Down, Depressed, Hopeless 0 0 0 3 3  PHQ - 2 Score 0 0 0 6 6   Altered sleeping 0 - - 0 3  Tired, decreased energy 0 - - 3 3  Change in appetite 0 - - 1 3  Feeling bad or failure about yourself  0 - - 3 3  Trouble concentrating 3 - - 3 3  Moving slowly or fidgety/restless 1 - - 3 0  Suicidal thoughts 0 - - 0 0  PHQ-9 Score 4 - - 19 21  Difficult doing work/chores Not difficult at all - - Somewhat difficult Somewhat difficult    Fall Risk Screen: see questionnaire below. Fall Risk  12/21/2020 12/21/2019 12/16/2018 12/10/2017 02/26/2017  Falls in the past year? 0 0 1 Yes Yes  Number falls in past yr: 0 0 0 2 or more 2 or more  Injury with Fall? 0 - 1 No Yes  Risk for fall due to : No Fall Risks - - - History of fall(s);Impaired balance/gait  Follow up Falls evaluation completed - - - -    ADL screen:  See questionnaire below Functional Status Survey: Is the patient deaf or have difficulty hearing?: No Does the patient have difficulty seeing, even when wearing glasses/contacts?: No Does the patient have difficulty concentrating, remembering, or making decisions?: Yes Does the patient have difficulty walking or climbing stairs?: No Does the patient have difficulty dressing or bathing?: No Does the patient have difficulty doing errands alone such as visiting a doctor's office or shopping?: No   End of Life Discussion:  Patient has a living will and medical power of attorney. MOST form reviewed and signed  Review of  Systems Constitutional: -fever, -chills, -sweats, -unexpected weight change, -anorexia, -fatigue Allergy: -sneezing, -itching, -congestion Dermatology: denies changing moles, rash, lumps, new worrisome lesions ENT: -runny nose, -ear pain, -sore throat, -hoarseness, -sinus pain, -teeth pain, -tinnitus, -hearing loss, -epistaxis Cardiology:  -chest pain, -palpitations, -edema, -orthopnea, -paroxysmal nocturnal dyspnea Respiratory: -cough, -shortness of breath, -dyspnea on exertion, -wheezing, -hemoptysis Gastroenterology: -abdominal pain,  -nausea, -vomiting, -diarrhea, -constipation, -blood in stool, -changes in bowel movement, -dysphagia Hematology: -bleeding or bruising problems Musculoskeletal: -arthralgias, -myalgias, -joint swelling, -back pain, -neck pain, -cramping, -gait changes Ophthalmology: -vision changes, -eye redness, -itching, -discharge Urology: -dysuria, -difficulty urinating, -hematuria, -urinary frequency, -urgency, -incontinence Neurology: -headache, -weakness, -tingling, -numbness, -speech abnormality, -memory loss, -falls, -dizziness Psychology:  -depressed mood, -agitation, -sleep problems    PHYSICAL EXAM:  BP 120/80   Pulse 64   Ht 5' 6.25" (1.683 m)   Wt 200 lb (90.7 kg)   LMP 06/28/2008   BMI 32.04 kg/m   General Appearance: Alert, cooperative, no distress, appears stated age Head: Normocephalic, without obvious abnormality, atraumatic Eyes: PERRL, conjunctiva/corneas clear, EOM's intact Ears: Normal TM's and external ear canals Nose: Mask on Throat: Mask on Neck: Supple, no lymphadenopathy; thyroid: no enlargement/tenderness/nodules; no JVD Back: Spine nontender, no curvature, ROM normal, no CVA tenderness Lungs: Clear to auscultation bilaterally without wheezes, rales or ronchi; respirations unlabored Chest Wall: No tenderness or deformity Heart: Regular rate and rhythm, S1 and S2 normal, no murmur, rub or gallop Breast Exam: OB/GYN Abdomen: Soft, non-tender, nondistended, normoactive bowel sounds, no masses, no hepatosplenomegaly Genitalia: OB/GYN Extremities: No clubbing, cyanosis or edema Pulses: 2+ and symmetric all extremities Skin: Skin color, texture, turgor normal, no rashes or lesions Lymph nodes: Cervical, supraclavicular, and axillary nodes normal Neurologic: CNII-XII intact, normal strength, sensation and gait Psych: Normal mood, affect, hygiene and grooming.  ASSESSMENT/PLAN: Medicare annual wellness visit, subsequent -Denies any concerns with ADLs, mood or memory.   No falls.  Advanced directive counseling done.  Medications reconciled.  Routine general medical examination at a health care facility - Plan: CBC with Differential/Platelet, Comprehensive metabolic panel, TSH, T4, free, T3 -Preventive health care reviewed.  She sees OB/GYN.  She sees several specialists.  Counseling on healthy lifestyle including diet and exercise.  Congratulated her on taking good care of her self.  Recommend regular dental and eye exams.  Immunizations reviewed.  Discussed safety and health promotion.  Elevated serum creatinine - Plan: Comprehensive metabolic panel -Continue to monitor  Pure hypercholesterolemia - Plan: Lipid panel -Continue Zetia.  She has not tolerated statins in the past.  Follow-up pending lipid panel results  Statin intolerance  Estrogen deficiency  Osteopenia determined by x-ray -Discussed getting adequate calcium in her diet, taking a vitamin D supplement and weightbearing exercises.  Disorder of bladder -Reports medication is keeping this under good control.  I will be willing to take over her medication as long as she is stable and she can see her urogynecologist as needed  Vitamin D deficiency - Plan: VITAMIN D 25 Hydroxy (Vit-D Deficiency, Fractures) -Continue vitamin D supplement and adjust dose as appropriate  Advance directive discussed with patient     Discussed monthly self breast exams and yearly mammograms; at least 30 minutes of aerobic activity at least 5 days/week and weight-bearing exercise 2x/week; proper sunscreen use reviewed; healthy diet, including goals of calcium and vitamin D intake and alcohol recommendations (less than or equal to 1 drink/day) reviewed; regular seatbelt use; changing batteries in smoke detectors.  Immunization recommendations discussed.  Colonoscopy recommendations reviewed  Medicare Attestation I have personally reviewed: The patient's medical and social history Their use of alcohol, tobacco or  illicit drugs Their current medications and supplements The patient's functional ability including ADLs,fall risks, home safety risks, cognitive, and hearing and visual impairment Diet and physical activities Evidence for depression or mood disorders  The patient's weight, height, and BMI have been recorded in the chart.  I have made referrals, counseling, and provided education to the patient based on review of the above and I have provided the patient with a written personalized care plan for preventive services.     Harland Dingwall, NP-C   12/21/2020

## 2020-12-21 ENCOUNTER — Other Ambulatory Visit: Payer: Self-pay

## 2020-12-21 ENCOUNTER — Encounter: Payer: Self-pay | Admitting: Family Medicine

## 2020-12-21 ENCOUNTER — Ambulatory Visit: Payer: Medicare PPO | Admitting: Family Medicine

## 2020-12-21 VITALS — BP 120/80 | HR 64 | Ht 66.25 in | Wt 200.0 lb

## 2020-12-21 DIAGNOSIS — E2839 Other primary ovarian failure: Secondary | ICD-10-CM

## 2020-12-21 DIAGNOSIS — R7989 Other specified abnormal findings of blood chemistry: Secondary | ICD-10-CM | POA: Diagnosis not present

## 2020-12-21 DIAGNOSIS — Z0000 Encounter for general adult medical examination without abnormal findings: Secondary | ICD-10-CM | POA: Diagnosis not present

## 2020-12-21 DIAGNOSIS — Z7189 Other specified counseling: Secondary | ICD-10-CM

## 2020-12-21 DIAGNOSIS — N329 Bladder disorder, unspecified: Secondary | ICD-10-CM

## 2020-12-21 DIAGNOSIS — M8580 Other specified disorders of bone density and structure, unspecified site: Secondary | ICD-10-CM

## 2020-12-21 DIAGNOSIS — Z789 Other specified health status: Secondary | ICD-10-CM

## 2020-12-21 DIAGNOSIS — E7800 Pure hypercholesterolemia, unspecified: Secondary | ICD-10-CM | POA: Diagnosis not present

## 2020-12-21 DIAGNOSIS — E559 Vitamin D deficiency, unspecified: Secondary | ICD-10-CM | POA: Diagnosis not present

## 2020-12-21 NOTE — Patient Instructions (Signed)
  Ms. Felan , Thank you for taking time to come for your Medicare Wellness Visit. I appreciate your ongoing commitment to your health goals. Please review the following plan we discussed and let me know if I can assist you in the future.   These are the goals we discussed:  Continue taking good care of yourself. We will be in touch with your lab results.   This is a list of the screening recommended for you and due dates:  Health Maintenance  Topic Date Due  . COVID-19 Vaccine (2 - Booster for Janssen series) 12/04/2019  . Flu Shot  02/19/2021  . Pap Smear  01/11/2022  . Mammogram  12/15/2022  . Tetanus Vaccine  01/11/2029  . Colon Cancer Screening  02/15/2030  . Hepatitis C Screening: USPSTF Recommendation to screen - Ages 42-79 yo.  Completed  . HIV Screening  Completed  . Zoster (Shingles) Vaccine  Completed  . HPV Vaccine  Aged Out

## 2020-12-22 LAB — CBC WITH DIFFERENTIAL/PLATELET
Basophils Absolute: 0 10*3/uL (ref 0.0–0.2)
Basos: 1 %
EOS (ABSOLUTE): 0.1 10*3/uL (ref 0.0–0.4)
Eos: 2 %
Hematocrit: 41.6 % (ref 34.0–46.6)
Hemoglobin: 13.8 g/dL (ref 11.1–15.9)
Immature Grans (Abs): 0 10*3/uL (ref 0.0–0.1)
Immature Granulocytes: 0 %
Lymphocytes Absolute: 1 10*3/uL (ref 0.7–3.1)
Lymphs: 26 %
MCH: 28.2 pg (ref 26.6–33.0)
MCHC: 33.2 g/dL (ref 31.5–35.7)
MCV: 85 fL (ref 79–97)
Monocytes Absolute: 0.4 10*3/uL (ref 0.1–0.9)
Monocytes: 9 %
Neutrophils Absolute: 2.6 10*3/uL (ref 1.4–7.0)
Neutrophils: 62 %
Platelets: 207 10*3/uL (ref 150–450)
RBC: 4.9 x10E6/uL (ref 3.77–5.28)
RDW: 12.2 % (ref 11.7–15.4)
WBC: 4.1 10*3/uL (ref 3.4–10.8)

## 2020-12-22 LAB — T3: T3, Total: 108 ng/dL (ref 71–180)

## 2020-12-22 LAB — TSH: TSH: 1.31 u[IU]/mL (ref 0.450–4.500)

## 2020-12-22 LAB — LIPID PANEL
Chol/HDL Ratio: 3.1 ratio (ref 0.0–4.4)
Cholesterol, Total: 203 mg/dL — ABNORMAL HIGH (ref 100–199)
HDL: 65 mg/dL (ref >39)
LDL Chol Calc (NIH): 123 mg/dL — ABNORMAL HIGH (ref 0–99)
Triglycerides: 82 mg/dL (ref 0–149)
VLDL Cholesterol Cal: 15 mg/dL (ref 5–40)

## 2020-12-22 LAB — COMPREHENSIVE METABOLIC PANEL
ALT: 13 IU/L (ref 0–32)
AST: 19 IU/L (ref 0–40)
Albumin/Globulin Ratio: 2.7 — ABNORMAL HIGH (ref 1.2–2.2)
Albumin: 4.8 g/dL (ref 3.8–4.9)
Alkaline Phosphatase: 60 IU/L (ref 44–121)
BUN/Creatinine Ratio: 10 — ABNORMAL LOW (ref 12–28)
BUN: 11 mg/dL (ref 8–27)
Bilirubin Total: 0.4 mg/dL (ref 0.0–1.2)
CO2: 23 mmol/L (ref 20–29)
Calcium: 9.5 mg/dL (ref 8.7–10.3)
Chloride: 97 mmol/L (ref 96–106)
Creatinine, Ser: 1.08 mg/dL — ABNORMAL HIGH (ref 0.57–1.00)
Globulin, Total: 1.8 g/dL (ref 1.5–4.5)
Glucose: 97 mg/dL (ref 65–99)
Potassium: 4.6 mmol/L (ref 3.5–5.2)
Sodium: 134 mmol/L (ref 134–144)
Total Protein: 6.6 g/dL (ref 6.0–8.5)
eGFR: 59 mL/min/{1.73_m2} — ABNORMAL LOW (ref >59)

## 2020-12-22 LAB — T4, FREE: Free T4: 1.02 ng/dL (ref 0.82–1.77)

## 2020-12-22 LAB — VITAMIN D 25 HYDROXY (VIT D DEFICIENCY, FRACTURES): Vit D, 25-Hydroxy: 48.3 ng/mL (ref 30.0–100.0)

## 2021-01-03 ENCOUNTER — Telehealth: Payer: Self-pay | Admitting: Family Medicine

## 2021-01-03 MED ORDER — METOPROLOL SUCCINATE ER 25 MG PO TB24: 1.0000 | ORAL_TABLET | Freq: Every day | ORAL | 1 refills | Status: DC

## 2021-01-03 MED ORDER — EZETIMIBE 10 MG PO TABS
10.0000 mg | ORAL_TABLET | Freq: Every day | ORAL | 1 refills | Status: DC
Start: 2021-01-03 — End: 2021-10-16

## 2021-01-03 NOTE — Telephone Encounter (Signed)
Fax from [facility]   Ezetimibe 10mg  Metoprolol ER 25mg 

## 2021-01-03 NOTE — Telephone Encounter (Signed)
Sent in meds 

## 2021-01-23 ENCOUNTER — Telehealth: Payer: Medicare PPO | Admitting: Psychiatry

## 2021-01-23 ENCOUNTER — Encounter: Payer: Self-pay | Admitting: Psychiatry

## 2021-01-23 ENCOUNTER — Telehealth: Payer: Self-pay | Admitting: Psychiatry

## 2021-01-23 DIAGNOSIS — F411 Generalized anxiety disorder: Secondary | ICD-10-CM | POA: Diagnosis not present

## 2021-01-23 DIAGNOSIS — F3181 Bipolar II disorder: Secondary | ICD-10-CM

## 2021-01-23 MED ORDER — OXCARBAZEPINE 150 MG PO TABS: 150.0000 mg | ORAL_TABLET | Freq: Two times a day (BID) | ORAL | 1 refills | Status: DC

## 2021-01-23 MED ORDER — ZIPRASIDONE HCL 20 MG PO CAPS: 20.0000 mg | ORAL_CAPSULE | Freq: Every day | ORAL | 1 refills | Status: DC

## 2021-01-23 MED ORDER — VENLAFAXINE HCL ER 75 MG PO CP24: 75.0000 mg | ORAL_CAPSULE | Freq: Every day | ORAL | 1 refills | Status: DC

## 2021-01-23 NOTE — Progress Notes (Signed)
Sarah Hogan [id number] 1961/02/26 60 y.o.  Virtual Visit via Video Note  I connected with pt @ on 01/23/21 at 11:00 AM EDT by a video enabled telemedicine application and verified that I am speaking with the correct person using two identifiers.   I discussed the limitations of evaluation and management by telemedicine and the availability of in person appointments. The patient expressed understanding and agreed to proceed.  I discussed the assessment and treatment plan with the patient. The patient was provided an opportunity to ask questions and all were answered. The patient agreed with the plan and demonstrated an understanding of the instructions.   The patient was advised to call back or seek an in-person evaluation if the symptoms worsen or if the condition fails to improve as anticipated.  I provided 25 minutes of non-face-to-face time during this encounter.  The patient was located at home.  The provider was located at [place].   Thayer Headings, PMHNP   Subjective:   Patient ID:  Sarah Hogan is a 60 y.o. (DOB March 03, 1961) female.  Chief Complaint:  Chief Complaint  Patient presents with   Follow-up    Bipolar D/O and anxiety    HPI Sarah Hogan presents for follow-up of mood disturbance and anxiety. She reports that she has been doing well. She reports that she would like to decrease Geodon due to biting on her lip. Denies bleeding. Denies any other involuntary movements. She tried taking Geodon 20 mg last night and had some increased difficulty falling asleep. She reports that her mood has been good. She is staying busy. Doing water aerobics 3 times a week. She is volunteering and going to [place]. She has 2 foster dogs. Energy and motivation have been good. She reports, "I always have anxiety." She avoids watching the news and asks friends not to discuss world events with her since this triggers her anxiety and fear. She reports that anxiety has been  consistent with baseline. Notices muscle tension, bouncing her leg, and worry. Appetite has been ok and has had a 10 lb weight loss with [employer]. She reports that her concentration is decreased due to MS. Denies SI.   Denies any manic s/s. She reports that last depressive episode was 1.5 years in duration.   She reports that she has noticed that she feels more distanced from others on Geodon 40 mg dose.   No longer in therapy.   She is in the process of refinancing her house. Recently had a fraudulent charge on her credit card.   She reports that she has a disability form that needs to be signed.   Reports that she has not taken Modafinil since last visit.   Review of Systems:  Review of Systems  Gastrointestinal:  Positive for constipation.  Musculoskeletal:  Negative for gait problem.  Neurological:  Negative for headaches.  Psychiatric/Behavioral:         Please refer to HPI   Medications: I have reviewed the patient's current medications.  Current Outpatient Medications  Medication Sig Dispense Refill   baclofen (LIORESAL) 10 MG tablet Take 20 mg by mouth 4 (four) times daily.     Biotin 5000 MCG CAPS Take by mouth.     cholecalciferol (VITAMIN D3) 25 MCG (1000 UNIT) tablet Take 1,000 Units by mouth daily.     Chromium Picolinate (CHROMIUM PICOLATE PO) Take by mouth.     ezetimibe (ZETIA) 10 MG tablet Take 1 tablet (10 mg total) by mouth daily. 90 tablet  1   magnesium 30 MG tablet Take 30 mg by mouth 2 (two) times daily.     metoprolol succinate (TOPROL-XL) 25 MG 24 hr tablet Take 1 tablet (25 mg total) by mouth daily. 90 tablet 1   MYRBETRIQ 25 MG TB24 tablet Take 25 mg by mouth daily.  3   NONFORMULARY OR COMPOUNDED ITEM Compounded progesterone SR 100mg .  1 capsule daily. 90 each 4   pregabalin (LYRICA) 50 MG capsule Take 2 capsules (100 mg total) by mouth 2 (two) times daily.     psyllium (METAMUCIL) 58.6 % packet Take 1 packet by mouth daily.     clonazePAM  (KLONOPIN) 0.5 MG tablet Take 1 tablet (0.5 mg total) by mouth daily as needed for anxiety. 30 tablet 2   modafinil (PROVIGIL) 100 MG tablet TAKE ONE TABLET BY MOUTH EVERY MORNING (Patient not taking: Reported on 01/23/2021) 30 tablet 3   OXcarbazepine (TRILEPTAL) 150 MG tablet Take 1 tablet (150 mg total) by mouth 2 (two) times daily. 180 tablet 1   venlafaxine XR (EFFEXOR-XR) 75 MG 24 hr capsule Take 1 capsule (75 mg total) by mouth daily with breakfast. 90 capsule 1   ziprasidone (GEODON) 20 MG capsule Take 1 capsule (20 mg total) by mouth at bedtime. 90 capsule 1   No current facility-administered medications for this visit.    Medication Side Effects: None  Allergies:  Allergies  Allergen Reactions   Amoxicillin Rash    Past Medical History:  Diagnosis Date   Allergy    Anxiety    Bipolar 2 disorder ([place])    Cervical polyp 12/16/2018   Depression    Former smoker 12/16/2018   Hyperlipidemia    Leukopenia    Mild aortic insufficiency    by echo 07/2016-- Leaky AV    Mild mitral regurgitation    by echo 07/2016   MS (multiple sclerosis) ([facility])    Neuromuscular disorder (HCC)    MS    Normal coronary arteries cath 2015   Overactive bladder    PVC's (premature ventricular contractions) 04/22/2017   SVT (supraventricular tachycardia) ([place]) 04/22/2017   Vitamin D deficiency     Family History  Problem Relation Age of Onset   Stroke Mother 68   Dementia Mother    Bipolar disorder Mother    Hypertension Brother    Hyperlipidemia Brother    Obesity Brother    Breast cancer Paternal Grandmother        over 55   Skin cancer Paternal Grandmother    Colon polyps Brother    Colon cancer Neg Hx    Esophageal cancer Neg Hx    Rectal cancer Neg Hx    Stomach cancer Neg Hx     Social History   Socioeconomic History   Marital status: Single    Spouse name: Not on file   Number of children: Not on file   Years of education: Not on file   Highest education level: Not on file   Occupational History   Not on file  Tobacco Use   Smoking status: Former    Packs/day: 1.50    Years: 20.00    Pack years: 30.00    Types: Cigarettes    Quit date: 10/28/1994    Years since quitting: 26.2   Smokeless tobacco: Never  Vaping Use   Vaping Use: Never used  Substance and Sexual Activity   Alcohol use: Yes    Alcohol/week: 0.0 - 1.0 standard drinks    Comment:  occ   Drug use: No   Sexual activity: Not Currently    Comment: since 2008  Other Topics Concern   Not on file  Social History Narrative   Not on file   Social Determinants of Health   Financial Resource Strain: Not on file  Food Insecurity: Not on file  Transportation Needs: Not on file  Physical Activity: Not on file  Stress: Not on file  Social Connections: Not on file  Intimate Partner Violence: Not on file    Past Medical History, Surgical history, Social history, and Family history were reviewed and updated as appropriate.   Please see review of systems for further details on the patient's review from today.   Objective:   Physical Exam:  Wt 198 lb (89.8 kg)   LMP 06/28/2008   BMI 31.72 kg/m   Physical Exam Neurological:     Mental Status: She is alert and oriented to person, place, and time.     Cranial Nerves: No dysarthria.  Psychiatric:        Attention and Perception: Attention and perception normal.        Mood and Affect: Mood is not depressed.        Speech: Speech normal.        Behavior: Behavior is cooperative.        Thought Content: Thought content normal. Thought content is not paranoid or delusional. Thought content does not include homicidal or suicidal ideation. Thought content does not include homicidal or suicidal plan.        Cognition and Memory: Cognition and memory normal.        Judgment: Judgment normal.     Comments: Insight intact Mood presents as mildly anxious    Lab Review:     Component Value Date/Time   NA 134 12/21/2020 1156   K 4.6 12/21/2020  1156   CL 97 12/21/2020 1156   CO2 23 12/21/2020 1156   GLUCOSE 97 12/21/2020 1156   GLUCOSE 87 02/26/2017 1126   BUN 11 12/21/2020 1156   CREATININE 1.08 (H) 12/21/2020 1156   CREATININE 1.13 (H) 02/26/2017 1126   CALCIUM 9.5 12/21/2020 1156   PROT 6.6 12/21/2020 1156   ALBUMIN 4.8 12/21/2020 1156   AST 19 12/21/2020 1156   ALT 13 12/21/2020 1156   ALKPHOS 60 12/21/2020 1156   BILITOT 0.4 12/21/2020 1156   GFRNONAA 57 (L) 12/21/2019 1027   GFRAA 66 12/21/2019 1027       Component Value Date/Time   WBC 4.1 12/21/2020 1156   WBC 2.2 (L) 02/26/2017 1126   RBC 4.90 12/21/2020 1156   RBC 4.41 02/26/2017 1126   HGB 13.8 12/21/2020 1156   HCT 41.6 12/21/2020 1156   PLT 207 12/21/2020 1156   MCV 85 12/21/2020 1156   MCH 28.2 12/21/2020 1156   MCH 28.3 02/26/2017 1126   MCHC 33.2 12/21/2020 1156   MCHC 32.7 02/26/2017 1126   RDW 12.2 12/21/2020 1156   LYMPHSABS 1.0 12/21/2020 1156   MONOABS 220 02/26/2017 1126   EOSABS 0.1 12/21/2020 1156   BASOSABS 0.0 12/21/2020 1156    No results found for: POCLITH, LITHIUM   No results found for: PHENYTOIN, PHENOBARB, VALPROATE, CBMZ   .res Assessment: Plan:   Will decrease Geodon to 20 mg at bedtime to possibly improve TD. Discussed that discussed that discontinuation s/s can occur with decrease in Geodon and she may notice some increase in anxiety or headaches in the next few days, particularly before her  next dose is due. Discussed that compounded doses are available through compound pharmacy and could be sent in if needed and to contact office if she experiences severe discontinuation s/s.  Discussed that there are medications available to treat TD if TD does not improve with dose reduction. Will continue Effexor XR 75 mg po qd for mood and anxiety.  Continue Trileptal 150 mg po BID for mood s/s.  Pt to follow-up in 3 months or sooner if clinically indicated.  Patient advised to contact office with any questions, adverse effects, or  acute worsening in signs and symptoms.  Sarah Hogan was seen today for follow-up.  Diagnoses and all orders for this visit:  Bipolar II disorder, moderate, depressed, with anxious distress ([place]) -     ziprasidone (GEODON) 20 MG capsule; Take 1 capsule (20 mg total) by mouth at bedtime. -     venlafaxine XR (EFFEXOR-XR) 75 MG 24 hr capsule; Take 1 capsule (75 mg total) by mouth daily with breakfast. -     OXcarbazepine (TRILEPTAL) 150 MG tablet; Take 1 tablet (150 mg total) by mouth 2 (two) times daily.  Generalized anxiety disorder -     venlafaxine XR (EFFEXOR-XR) 75 MG 24 hr capsule; Take 1 capsule (75 mg total) by mouth daily with breakfast.    Please see After Visit Summary for patient specific instructions.  Future Appointments  Date Time Provider [place]  02/12/2021  3:45 PM Megan Salon, MD [facility]  04/25/2021 12:45 PM Thayer Headings, PMHNP [facility] None  12/25/2021 11:15 AM Girtha Rm, NP-C [facility]    No orders of the defined types were placed in this encounter.     -------------------------------

## 2021-01-23 NOTE — Telephone Encounter (Signed)
Pt brought in disability Paperwork in for jessica to fill out. [employer]. Given paperwork to [employer]

## 2021-02-02 ENCOUNTER — Telehealth: Payer: Self-pay

## 2021-02-02 DIAGNOSIS — Z0289 Encounter for other administrative examinations: Secondary | ICD-10-CM

## 2021-02-02 NOTE — Telephone Encounter (Signed)
Form completed for [employer]. Given to [place] for review and signature

## 2021-02-12 ENCOUNTER — Other Ambulatory Visit (HOSPITAL_COMMUNITY)
Admission: RE | Admit: 2021-02-12 | Discharge: 2021-02-12 | Disposition: A | Discharge status: Home / Self Care | Payer: Medicare PPO | Source: Ambulatory Visit | Attending: Obstetrics & Gynecology | Admitting: Obstetrics & Gynecology

## 2021-02-12 ENCOUNTER — Ambulatory Visit (INDEPENDENT_AMBULATORY_CARE_PROVIDER_SITE_OTHER): Payer: Medicare PPO | Admitting: Obstetrics & Gynecology

## 2021-02-12 ENCOUNTER — Other Ambulatory Visit: Payer: Self-pay

## 2021-02-12 ENCOUNTER — Encounter (HOSPITAL_BASED_OUTPATIENT_CLINIC_OR_DEPARTMENT_OTHER): Payer: Self-pay | Admitting: Obstetrics & Gynecology

## 2021-02-12 VITALS — BP 96/66 | HR 74 | Ht 65.5 in | Wt 194.8 lb

## 2021-02-12 DIAGNOSIS — Z01419 Encounter for gynecological examination (general) (routine) without abnormal findings: Secondary | ICD-10-CM

## 2021-02-12 DIAGNOSIS — Z780 Asymptomatic menopausal state: Secondary | ICD-10-CM

## 2021-02-12 DIAGNOSIS — Z803 Family history of malignant neoplasm of breast: Secondary | ICD-10-CM

## 2021-02-12 DIAGNOSIS — Z124 Encounter for screening for malignant neoplasm of cervix: Secondary | ICD-10-CM | POA: Diagnosis not present

## 2021-02-12 DIAGNOSIS — G35 Multiple sclerosis: Secondary | ICD-10-CM

## 2021-02-12 DIAGNOSIS — N3281 Overactive bladder: Secondary | ICD-10-CM

## 2021-02-12 MED ORDER — NONFORMULARY OR COMPOUNDED ITEM
4 refills | Status: DC
Start: 2021-02-12 — End: 2021-02-12

## 2021-02-12 MED ORDER — NONFORMULARY OR COMPOUNDED ITEM: 4 refills | Status: DC

## 2021-02-12 NOTE — Progress Notes (Signed)
60 y.o. G0P0000 Single White or Caucasian female here for breast and pelvic exam.  She is on progesterone only for hormonal therapy.  She has been on this for several years.  Denies vaginal bleeding.  Reports she is feeling much better from a mood standpoint than she has in years.  Her dog of 16 years died in 08/23/22 and she's fostering dogs and she's having to walk and be more active.  This has been a good thing.    Patient's last menstrual period was 06/28/2008.          Sexually active: No.  H/O STD:  no  Health Maintenance: PCP:  Harland Dingwall, NP.  Last wellness appt was 12/2020.  Did blood work at that appt:  yes Vaccines are up to date:  yes Colonoscopy:  02/16/2020, hyperplastic polyp.  Follow up 5 years. MMG:  12/18/2019 Negative BMD:  03/24/2020, -1.7 Last pap smear:  01/12/2019 Negative.     reports that she quit smoking about 26 years ago. Her smoking use included cigarettes. She has a 30.00 pack-year smoking history. She has never used smokeless tobacco. She reports current alcohol use. She reports that she does not use drugs.  Past Medical History:  Diagnosis Date   Allergy    Anxiety    Bipolar 2 disorder ([place])    Cervical polyp 12/16/2018   Depression    Former smoker 12/16/2018   Hyperlipidemia    Leukopenia    Mild aortic insufficiency    by echo 08/23/2016-- Leaky AV    Mild mitral regurgitation    by echo 23-Aug-2016   MS (multiple sclerosis) ([facility])    Neuromuscular disorder ([facility])    MS    Normal coronary arteries cath 2015   Overactive bladder    PVC's (premature ventricular contractions) 04/22/2017   SVT (supraventricular tachycardia) ([place]) 04/22/2017   Vitamin D deficiency     Past Surgical History:  Procedure Laterality Date   BREAST BIOPSY Left 2002   CARPAL TUNNEL RELEASE     CHOLECYSTECTOMY     COLONOSCOPY  2010   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     Biotin 5000 MCG CAPS Take by mouth.     cholecalciferol (VITAMIN D3) 25 MCG (1000 UNIT) tablet Take 1,000 Units by mouth daily.     Chromium Picolinate (CHROMIUM PICOLATE PO) Take by mouth.     clonazePAM (KLONOPIN) 0.5 MG tablet Take 1 tablet (0.5 mg total) by mouth daily as needed for anxiety. 30 tablet 2   ezetimibe (ZETIA) 10 MG tablet Take 1 tablet (10 mg total) by mouth daily. 90 tablet 1   magnesium 30 MG tablet Take 30 mg by mouth 2 (two) times daily.     metoprolol succinate (TOPROL-XL) 25 MG 24 hr tablet Take 1 tablet (25 mg total) by mouth daily. 90 tablet 1   modafinil (PROVIGIL) 100 MG tablet TAKE ONE TABLET BY MOUTH EVERY MORNING 30 tablet 3   MYRBETRIQ 25 MG TB24 tablet Take 25 mg by mouth daily.  3   NONFORMULARY OR COMPOUNDED ITEM Compounded progesterone SR '100mg'$ .  1 capsule daily. 90 each 4   OXcarbazepine (  TRILEPTAL) 150 MG tablet Take 1 tablet (150 mg total) by mouth 2 (two) times daily. 180 tablet 1   pregabalin (LYRICA) 50 MG capsule Take 2 capsules (100 mg total) by mouth 2 (two) times daily.     psyllium (METAMUCIL) 58.6 % packet Take 1 packet by mouth daily.     venlafaxine XR (EFFEXOR-XR) 75 MG 24 hr capsule Take 1 capsule (75 mg total) by mouth daily with breakfast. 90 capsule 1   ziprasidone (GEODON) 20 MG capsule Take 1 capsule (20 mg total) by mouth at bedtime. 90 capsule 1   No current facility-administered medications for this visit.    Family History  Problem Relation Age of Onset   Stroke Mother 61   Dementia Mother    Bipolar disorder Mother    Hypertension Brother    Hyperlipidemia Brother    Obesity Brother    Breast cancer Paternal Grandmother        over 62   Skin cancer Paternal Grandmother    Colon polyps Brother    Colon cancer Neg Hx    Esophageal  cancer Neg Hx    Rectal cancer Neg Hx    Stomach cancer Neg Hx     Review of Systems  Constitutional: Negative.   Gastrointestinal: Negative.   Genitourinary:  Positive for urgency.   Exam:   BP 96/66 (BP Location: Left Arm, Patient Position: Sitting, Cuff Size: Large)   Pulse 74   Ht 5' 5.5" (1.664 m)   Wt 194 lb 12.8 oz (88.4 kg)   LMP 06/28/2008   BMI 31.92 kg/m   Height: 5' 5.5" (166.4 cm)  General appearance: alert, cooperative and appears stated age Breasts: normal appearance, no masses or tenderness Abdomen: soft, non-tender; bowel sounds normal; no masses,  no organomegaly Lymph nodes: Cervical, supraclavicular, and axillary nodes normal.  No abnormal inguinal nodes palpated Neurologic: Grossly normal  Pelvic: External genitalia:  no lesions              Urethra:  normal appearing urethra with no masses, tenderness or lesions              Bartholins and Skenes: normal                 Vagina: normal appearing vagina with atrophic changes and no discharge, no lesions              Cervix: no lesions              Pap taken: Yes.   Bimanual Exam:  Uterus:  normal size, contour, position, consistency, mobility, non-tender              Adnexa: normal adnexa and no mass, fullness, tenderness               Rectovaginal: Confirms               Anus:  normal sphincter tone, no lesions  Chaperone, Octaviano Batty, CMA, was present for exam.  Assessment/Plan: 1. Encounter for gynecological examination without abnormal finding - pt desires pap smear today.  Will plan HR HPV next year as was only 2 years ago and will not be covered.  D/w pt screening guidelines.  States it makes her very uncomfortable to not have any screening for 3 years.  Will continue to discuss and review guidelines - MMG 12/18/2019 - BMD 03/24/2020 - Colonoscopy 01/2020 follow up 5 years - vaccines reviewed - Lab work done with Harland Dingwall, PCP  2. Postmenopausal - NONFORMULARY OR COMPOUNDED ITEM; Compounded  progesterone SR '100mg'$ .  1 capsule daily.  Dispense: 90 each; Refill: 4  3. MS (multiple sclerosis) ([place])  4. Family history of breast cancer - PGM who was >age 33 at diagnosis  5. OAB (overactive bladder)

## 2021-02-15 LAB — CYTOLOGY - PAP: Diagnosis: NEGATIVE

## 2021-03-08 DIAGNOSIS — D3132 Benign neoplasm of left choroid: Secondary | ICD-10-CM | POA: Diagnosis not present

## 2021-03-08 DIAGNOSIS — H2513 Age-related nuclear cataract, bilateral: Secondary | ICD-10-CM | POA: Diagnosis not present

## 2021-03-08 DIAGNOSIS — H43813 Vitreous degeneration, bilateral: Secondary | ICD-10-CM | POA: Diagnosis not present

## 2021-03-16 ENCOUNTER — Ambulatory Visit: Payer: Self-pay

## 2021-04-17 DIAGNOSIS — R300 Dysuria: Secondary | ICD-10-CM | POA: Diagnosis not present

## 2021-04-25 ENCOUNTER — Ambulatory Visit (INDEPENDENT_AMBULATORY_CARE_PROVIDER_SITE_OTHER): Payer: Medicare PPO | Admitting: Psychiatry

## 2021-04-25 ENCOUNTER — Other Ambulatory Visit: Payer: Self-pay

## 2021-04-25 ENCOUNTER — Encounter: Payer: Self-pay | Admitting: Psychiatry

## 2021-04-25 DIAGNOSIS — F3181 Bipolar II disorder: Secondary | ICD-10-CM

## 2021-04-25 DIAGNOSIS — F411 Generalized anxiety disorder: Secondary | ICD-10-CM | POA: Diagnosis not present

## 2021-04-25 MED ORDER — CLONAZEPAM 0.5 MG PO TABS: 0.5000 mg | ORAL_TABLET | Freq: Every day | ORAL | 0 refills | Status: DC | PRN

## 2021-04-25 MED ORDER — ZIPRASIDONE HCL 40 MG PO CAPS: 40.0000 mg | ORAL_CAPSULE | Freq: Every day | ORAL | 1 refills | Status: DC

## 2021-04-25 MED ORDER — VENLAFAXINE HCL ER 75 MG PO CP24: 75.0000 mg | ORAL_CAPSULE | Freq: Every day | ORAL | 1 refills | Status: DC

## 2021-04-25 MED ORDER — OXCARBAZEPINE 150 MG PO TABS: 150.0000 mg | ORAL_TABLET | Freq: Two times a day (BID) | ORAL | 1 refills | Status: DC

## 2021-04-25 NOTE — Progress Notes (Signed)
Sarah Hogan [id number] 09-28-1960 60 y.o.  Subjective:   Patient ID:  Sarah Hogan is a 60 y.o. (DOB March 23, 1961) female.  Chief Complaint:  Chief Complaint  Patient presents with   Depression   Other    Tardive Dyskinesia   Anxiety     HPI Sarah Hogan presents to the office today for follow-up of depression and anxiety. She reports that she has been repeatedly chewing her lip and having uncontrolled movements of her lip/mouth. She reports, "I can't stop it.... it's getting worse instead of better."   "I'm sliding back into a depression." She reports that one Geodon has not been helpful and thinks that she may need to increase dose. Lower energy and motivation. She reports that she has been falling behind on things, such as chores. Has withdrawn from others. Excused herself twice in the last month from family dinners and normally would enjoy these gatherings. "I just sit in my house and read." She reports sleeping well. Appetite as been poor. Has to take Provigil in order to do her paperwork. Denies SI.   Avoids watching the news since this can increase her anxiety. Denies excessive worry. Denies rumination. Denies any panic s/s.   Denies impulsive or risky behavior.   Has 2 foster dogs. She as not been walking as much. Still doing water aerobics and going to church.   Has had predominantly more depressive episodes than manic episodes throughout her lifetime.   Recent UTI.    [name] been helpful for her depression. Effexor- Taken for several years Buspar Trileptal Tranxene Provigil Trazodone  AIMS    Flowsheet Row Office Visit from 04/25/2021 in [place] Visit from 10/24/2020 in [place] Visit from 08/29/2020 in [facility] Total Score 15 1 1       PHQ2-9    [place] Visit from 02/12/2021 in [place] from 12/21/2020 in [place] from 12/21/2019 in [place] from 12/16/2018 in [place] from 12/10/2017 in [place]  PHQ-2 Total Score 0 0 0 0 6  PHQ-9 Total Score -- 4 -- -- 19        Review of Systems:  Review of Systems  HENT:  Positive for rhinorrhea.   Genitourinary:        Recent UTI. Notices some residual discomfort  Musculoskeletal:  Negative for gait problem.  Neurological:  Negative for tremors.  Psychiatric/Behavioral:         Please refer to HPI   Medications: I have reviewed the patient's current medications.  Current Outpatient Medications  Medication Sig Dispense Refill   baclofen (LIORESAL) 10 MG tablet Take 20 mg by mouth 4 (four) times daily. Taking one in the morning and 2 tabs at night     Biotin 5000 MCG CAPS Take by mouth.     cholecalciferol (VITAMIN D3) 25 MCG (1000 UNIT) tablet Take 1,000 Units by mouth daily.     Chromium Picolinate (CHROMIUM PICOLATE PO) Take by mouth.     ezetimibe (ZETIA) 10 MG tablet Take 1 tablet (10 mg total) by mouth daily. 90 tablet 1   magnesium 30 MG tablet Take 30 mg by mouth 2 (two) times daily.     metoprolol succinate (TOPROL-XL) 25 MG 24 hr tablet Take 1 tablet (25 mg total) by mouth daily. 90 tablet 1   modafinil (PROVIGIL) 100 MG tablet TAKE ONE TABLET BY MOUTH EVERY  MORNING 30 tablet 3   MYRBETRIQ 25 MG TB24 tablet Take 25 mg by mouth daily.  3   NONFORMULARY OR COMPOUNDED ITEM Compounded progesterone SR 100mg .  1 capsule daily. 90 each 4   pregabalin (LYRICA) 50 MG capsule Take 2 capsules (100 mg total) by mouth 2 (two) times daily.     psyllium (METAMUCIL) 58.6 % packet Take 1 packet by mouth daily.     clonazePAM (KLONOPIN) 0.5 MG tablet Take 1 tablet (0.5 mg total) by mouth daily as needed for anxiety. 30 tablet 0   OXcarbazepine (TRILEPTAL) 150 MG tablet Take 1 tablet (150 mg total) by mouth 2 (two) times daily. 180 tablet 1   venlafaxine XR (EFFEXOR-XR)  75 MG 24 hr capsule Take 1 capsule (75 mg total) by mouth daily with breakfast. 90 capsule 1   ziprasidone (GEODON) 40 MG capsule Take 1 capsule (40 mg total) by mouth at bedtime. 90 capsule 1   No current facility-administered medications for this visit.    Medication Side Effects: Other: Tardive dyskinesia  Allergies:  Allergies  Allergen Reactions   Amoxicillin Rash    Past Medical History:  Diagnosis Date   Allergy    Anxiety    Bipolar 2 disorder ([place])    Cervical polyp 12/16/2018   Depression    Former smoker 12/16/2018   Hyperlipidemia    Leukopenia    Mild aortic insufficiency    by echo 07/2016-- Leaky AV    Mild mitral regurgitation    by echo 07/2016   MS (multiple sclerosis) (HCC)    Neuromuscular disorder (HCC)    MS    Normal coronary arteries cath 2015   Overactive bladder    PVC's (premature ventricular contractions) 04/22/2017   SVT (supraventricular tachycardia) ([place]) 04/22/2017   Vitamin D deficiency     Past Medical History, Surgical history, Social history, and Family history were reviewed and updated as appropriate.   Please see review of systems for further details on the patient's review from today.   Objective:   Physical Exam:  LMP 06/28/2008   Physical Exam Constitutional:      General: She is not in acute distress. Musculoskeletal:        General: No deformity.  Neurological:     Mental Status: She is alert and oriented to person, place, and time.     Coordination: Coordination normal.  Psychiatric:        Attention and Perception: Attention and perception normal. She does not perceive auditory or visual hallucinations.        Mood and Affect: Mood is depressed. Mood is not anxious. Affect is not labile, blunt, angry or inappropriate.        Speech: Speech normal.        Behavior: Behavior normal.        Thought Content: Thought content normal. Thought content is not paranoid or delusional. Thought content does not include homicidal or  suicidal ideation. Thought content does not include homicidal or suicidal plan.        Cognition and Memory: Cognition and memory normal.        Judgment: Judgment normal.     Comments: Insight intact    Lab Review:     Component Value Date/Time   NA 134 12/21/2020 1156   K 4.6 12/21/2020 1156   CL 97 12/21/2020 1156   CO2 23 12/21/2020 1156   GLUCOSE 97 12/21/2020 1156   GLUCOSE 87 02/26/2017 1126   BUN 11 12/21/2020  1156   CREATININE 1.08 (H) 12/21/2020 1156   CREATININE 1.13 (H) 02/26/2017 1126   CALCIUM 9.5 12/21/2020 1156   PROT 6.6 12/21/2020 1156   ALBUMIN 4.8 12/21/2020 1156   AST 19 12/21/2020 1156   ALT 13 12/21/2020 1156   ALKPHOS 60 12/21/2020 1156   BILITOT 0.4 12/21/2020 1156   GFRNONAA 57 (L) 12/21/2019 1027   GFRAA 66 12/21/2019 1027       Component Value Date/Time   WBC 4.1 12/21/2020 1156   WBC 2.2 (L) 02/26/2017 1126   RBC 4.90 12/21/2020 1156   RBC 4.41 02/26/2017 1126   HGB 13.8 12/21/2020 1156   HCT 41.6 12/21/2020 1156   PLT 207 12/21/2020 1156   MCV 85 12/21/2020 1156   MCH 28.2 12/21/2020 1156   MCH 28.3 02/26/2017 1126   MCHC 33.2 12/21/2020 1156   MCHC 32.7 02/26/2017 1126   RDW 12.2 12/21/2020 1156   LYMPHSABS 1.0 12/21/2020 1156   MONOABS 220 02/26/2017 1126   EOSABS 0.1 12/21/2020 1156   BASOSABS 0.0 12/21/2020 1156    No results found for: POCLITH, LITHIUM   No results found for: PHENYTOIN, PHENOBARB, VALPROATE, CBMZ   .res Assessment: Plan:   Pt seen for 30 minutes and time spent discussing that involuntary orofacial movements are consistent with tardive dyskinesia, likely due to long-term use of antipsychotics such as Geodon.  Discussed option to start treatment for tardive dyskinesia.  Patient reports that she would prefer not to start an additional medication at this time but will consider treatment if tardive dyskinesia worsens. She requests that Geodon be increased from 20mg  to 40 mg at bedtime for treatment of depression  since this has been helpful for her depression in the past and feels that benefits are outweighing side effects and risks at this time. Continue oxcarbazepine 150 mg twice daily for mood stabilization. Continue venlafaxine XR 75 mg daily for mood and anxiety signs and symptoms. Patient to follow-up in 6 weeks or sooner if clinically indicated. Patient advised to contact office with any questions, adverse effects, or acute worsening in signs and symptoms.   Ryleeann was seen today for depression, other and anxiety.  Diagnoses and all orders for this visit:  Bipolar II disorder, moderate, depressed, with anxious distress ([place]) -     ziprasidone (GEODON) 40 MG capsule; Take 1 capsule (40 mg total) by mouth at bedtime. -     venlafaxine XR (EFFEXOR-XR) 75 MG 24 hr capsule; Take 1 capsule (75 mg total) by mouth daily with breakfast. -     OXcarbazepine (TRILEPTAL) 150 MG tablet; Take 1 tablet (150 mg total) by mouth 2 (two) times daily.  Generalized anxiety disorder -     venlafaxine XR (EFFEXOR-XR) 75 MG 24 hr capsule; Take 1 capsule (75 mg total) by mouth daily with breakfast. -     clonazePAM (KLONOPIN) 0.5 MG tablet; Take 1 tablet (0.5 mg total) by mouth daily as needed for anxiety.    Please see After Visit Summary for patient specific instructions.  Future Appointments  Date Time Provider [place]  05/30/2021 12:45 PM Imogene Burn, PA-C [facility]  06/05/2021  2:00 PM Thayer Headings, PMHNP [facility] None  12/25/2021 11:15 AM Girtha Rm, NP-C [facility]    No orders of the defined types were placed in this encounter.   -------------------------------

## 2021-05-08 DIAGNOSIS — Z79899 Other long term (current) drug therapy: Secondary | ICD-10-CM | POA: Diagnosis not present

## 2021-05-08 DIAGNOSIS — G35 Multiple sclerosis: Secondary | ICD-10-CM | POA: Diagnosis not present

## 2021-05-28 NOTE — Progress Notes (Signed)
Cardiology Office Note    Date:  05/30/2021   ID:  Sarah Hogan, DOB 1960-12-10, MRN [id number]   PCP:  Henson, Vickie L, [place]  Cardiologist:  Fransico Him, MD   Advanced Practice Provider:  No care team member to display Electrophysiologist:  None   [phone]   Chief Complaint  Patient presents with   Follow-up    History of Present Illness:  Sarah Hogan is a 61 y.o. female  with a hx of hyperlipidemia, MS, GERD, migraines, bipolar disorder, tobacco abuse with 40 pk year history of smoking and normal coronary arteries by cath for positive stress test done for fatigue in 2015.  She has a history SVT with PACs and PVCs on event monitor. Her echo 08/2016 showed normal LVF with ER 65-70% with mild AR and MR.  Patient last saw Dr. Radford Pax 05/09/2020 and was doing well.  Patient comes in for f/u. Has occasional palpitations when she goes to sleep-doesn't last long. Denies chest pain, dyspnea, dizziness or presyncope. She does water aerobics 3 times a week and fosters dogs. Walking them 3 times a day. Labs 12/2020 LDL 123. Has lost 25 lbs on weight watchers in 5 months.       Past Medical History:  Diagnosis Date   Allergy    Anxiety    Bipolar 2 disorder ([place])    Cervical polyp 12/16/2018   Depression    Former smoker 12/16/2018   Hyperlipidemia    Leukopenia    Mild aortic insufficiency    by echo 07/2016-- Leaky AV    Mild mitral regurgitation    by echo 07/2016   MS (multiple sclerosis) (HCC)    Neuromuscular disorder (HCC)    MS    Normal coronary arteries cath 2015   Overactive bladder    PVC's (premature ventricular contractions) 04/22/2017   SVT (supraventricular tachycardia) ([place]) 04/22/2017   Vitamin D deficiency     Past Surgical History:  Procedure Laterality Date   BREAST BIOPSY Left 2002   CARPAL TUNNEL RELEASE     CHOLECYSTECTOMY     COLONOSCOPY  2010   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 Taking one in the morning and 2 tabs at night   Biotin 5000 MCG CAPS Take by mouth.   cholecalciferol (VITAMIN D3) 25 MCG (1000 UNIT) tablet Take 1,000 Units by mouth daily.   Chromium Picolinate (CHROMIUM PICOLATE PO) Take by mouth.   clonazePAM (KLONOPIN) 0.5 MG tablet Take 1 tablet (0.5 mg total) by mouth daily as needed for anxiety.   ezetimibe (ZETIA) 10 MG tablet Take 1 tablet (10 mg total) by mouth daily.   metoprolol succinate (TOPROL-XL) 25 MG 24 hr tablet Take 1 tablet (25 mg total) by mouth daily.   modafinil (PROVIGIL) 100 MG tablet TAKE ONE TABLET BY MOUTH EVERY MORNING   MYRBETRIQ 25 MG TB24 tablet Take 25 mg by mouth daily.   NONFORMULARY OR COMPOUNDED ITEM Compounded progesterone SR 100mg .  1 capsule daily.   OXcarbazepine (TRILEPTAL) 150 MG tablet Take 1 tablet (150 mg total) by mouth 2 (two) times daily.   pregabalin (LYRICA) 50  MG capsule Take 50 mg by mouth daily.   psyllium (METAMUCIL) 58.6 % packet Take 1 packet by mouth daily.   venlafaxine XR (EFFEXOR-XR) 75 MG 24 hr capsule Take 1 capsule (75 mg total) by mouth daily with breakfast.   ziprasidone (GEODON) 40 MG capsule Take 1 capsule (40 mg total) by mouth at bedtime.     Allergies:   Amoxicillin   Social History   Socioeconomic History   Marital status: Single    Spouse name: Not on file   Number of children: Not on file   Years of education: Not on file   Highest education level: Not on file  Occupational History   Not on file  Tobacco Use   Smoking status: Former    Packs/day: 1.50    Years: 20.00    Pack years: 30.00    Types: Cigarettes    Quit date: 10/28/1994    Years since quitting: 26.6   Smokeless tobacco: Never  Vaping Use   Vaping  Use: Never used  Substance and Sexual Activity   Alcohol use: Yes    Alcohol/week: 0.0 - 1.0 standard drinks    Comment: occ   Drug use: No   Sexual activity: Not Currently    Comment: since 2008  Other Topics Concern   Not on file  Social History Narrative   Not on file   Social Determinants of Health   Financial Resource Strain: Not on file  Food Insecurity: Not on file  Transportation Needs: Not on file  Physical Activity: Not on file  Stress: Not on file  Social Connections: Not on file     Family History:  The patient's  family history includes Bipolar disorder in her mother; Breast cancer in her paternal grandmother; Colon polyps in her brother; Dementia in her mother; Hyperlipidemia in her brother; Hypertension in her brother; Obesity in her brother; Skin cancer in her paternal grandmother; Stroke (age of onset: 51) in her mother.   ROS:   Please see the history of present illness.    ROS All other systems reviewed and are negative.   PHYSICAL EXAM:   VS:  BP (!) 98/52   Pulse (!) 55   Ht 5' 5.5" (1.664 m)   Wt 181 lb 3.2 oz (82.2 kg)   LMP 06/28/2008   SpO2 97%   BMI 29.69 kg/m   Physical Exam  GEN: Well nourished, well developed, in no acute distress  Neck: no JVD, carotid bruits, or masses Cardiac:RRR; no murmurs, rubs, or gallops  Respiratory:  clear to auscultation bilaterally, normal work of breathing GI: soft, nontender, nondistended, + BS Ext: without cyanosis, clubbing, or edema, Good distal pulses bilaterally Neuro:  Alert and Oriented x 3 Psych: euthymic mood, full affect  Wt Readings from Last 3 Encounters:  05/30/21 181 lb 3.2 oz (82.2 kg)  02/12/21 194 lb 12.8 oz (88.4 kg)  12/21/20 200 lb (90.7 kg)      Studies/Labs Reviewed:   EKG:  EKG is  ordered today.  The ekg ordered today demonstrates NSR, low voltage, nonspecific ST changes  Recent Labs: 12/21/2020: ALT 13; BUN 11; Creatinine, Ser 1.08; Hemoglobin 13.8; Platelets 207; Potassium  4.6; Sodium 134; TSH 1.310   Lipid Panel    Component Value Date/Time   CHOL 203 (H) 12/21/2020 1156   TRIG 82 12/21/2020 1156   HDL 65 12/21/2020 1156   CHOLHDL 3.1 12/21/2020 1156   CHOLHDL 4.2 02/26/2017 1126   VLDL 38 (H) 02/26/2017 1126  LDLCALC 123 (H) 12/21/2020 1156    Additional studies/ records that were reviewed today include:  Echo 10/2019 IMPRESSIONS     1. Left ventricular ejection fraction, by estimation, is 60 to 65%. The  left ventricle has normal function. Left ventricular endocardial border  not optimally defined to evaluate regional wall motion. There is mild  asymmetric left ventricular  hypertrophy. Left ventricular diastolic parameters are consistent with  Grade I diastolic dysfunction (impaired relaxation).   2. Right ventricular systolic function is normal. The right ventricular  size is normal. Tricuspid regurgitation signal is inadequate for assessing  PA pressure.   3. The mitral valve is normal in structure. No evidence of mitral valve  regurgitation. No evidence of mitral stenosis.   4. The aortic valve is tricuspid. Aortic valve regurgitation is mild. No  aortic stenosis is present.   5. The inferior vena cava is normal in size with greater than 50%  respiratory variability, suggesting right atrial pressure of 3 mmHg.    Risk Assessment/Calculations:         ASSESSMENT:    1. SVT (supraventricular tachycardia) ([place])   2. PVC's (premature ventricular contractions)   3. Mild aortic insufficiency   4. Hyperlipidemia, unspecified hyperlipidemia type      PLAN:  In order of problems listed above:  SVT-controlled on Toprol   PVCs doing well on toprol  Mild AI-no symptoms  HLD on Zetia LDL 123  Shared Decision Making/Informed Consent        Medication Adjustments/Labs and Tests Ordered: Current medicines are reviewed at length with the patient today.  Concerns regarding medicines are outlined above.  Medication changes, Labs  and Tests ordered today are listed in the Patient Instructions below. Patient Instructions  Medication Instructions:  Your physician recommends that you continue on your current medications as directed. Please refer to the Current Medication list given to you today.  *If you need a refill on your cardiac medications before your next appointment, please call your pharmacy*   Lab Work: None If you have labs (blood work) drawn today and your tests are completely normal, you will receive your results only by: [place] (if you have MyChart) OR A paper copy in the mail If you have any lab test that is abnormal or we need to change your treatment, we will call you to review the results.   Follow-Up: At [facility], you and your health needs are our priority.  As part of our continuing mission to provide you with exceptional heart care, we have created designated Provider Care Teams.  These Care Teams include your primary Cardiologist (physician) and Advanced Practice Providers (APPs -  Physician Assistants and Nurse Practitioners) who all work together to provide you with the care you need, when you need it.  Your next appointment:   1 year(s)  The format for your next appointment:   In Person  Provider:   Fransico Him, MD {      Signed, Ermalinda Barrios, PA-C  05/30/2021 1:40 PM    [facility] [address], [place], [place]  [address] Phone: [phone]; Fax: [phone]

## 2021-05-30 ENCOUNTER — Ambulatory Visit: Payer: Medicare PPO | Admitting: Physician Assistant

## 2021-05-30 ENCOUNTER — Encounter: Payer: Self-pay | Admitting: Physician Assistant

## 2021-05-30 ENCOUNTER — Other Ambulatory Visit: Payer: Self-pay

## 2021-05-30 VITALS — BP 98/52 | HR 55 | Ht 65.5 in | Wt 181.2 lb

## 2021-05-30 DIAGNOSIS — I493 Ventricular premature depolarization: Secondary | ICD-10-CM | POA: Diagnosis not present

## 2021-05-30 DIAGNOSIS — E785 Hyperlipidemia, unspecified: Secondary | ICD-10-CM

## 2021-05-30 DIAGNOSIS — I471 Supraventricular tachycardia, unspecified: Secondary | ICD-10-CM

## 2021-05-30 DIAGNOSIS — I351 Nonrheumatic aortic (valve) insufficiency: Secondary | ICD-10-CM

## 2021-05-30 NOTE — Patient Instructions (Signed)
Medication Instructions:  Your physician recommends that you continue on your current medications as directed. Please refer to the Current Medication list given to you today.  *If you need a refill on your cardiac medications before your next appointment, please call your pharmacy*   Lab Work: None If you have labs (blood work) drawn today and your tests are completely normal, you will receive your results only by: MyChart Message (if you have MyChart) OR A paper copy in the mail If you have any lab test that is abnormal or we need to change your treatment, we will call you to review the results.   Follow-Up: At [facility], you and your health needs are our priority.  As part of our continuing mission to provide you with exceptional heart care, we have created designated Provider Care Teams.  These Care Teams include your primary Cardiologist (physician) and Advanced Practice Providers (APPs -  Physician Assistants and Nurse Practitioners) who all work together to provide you with the care you need, when you need it.   Your next appointment:   1 year(s)  The format for your next appointment:   In Person  Provider:   Traci Turner, MD { 

## 2021-06-04 NOTE — Addendum Note (Signed)
Addended by: Gar Ponto on: 06/04/2021 09:15 AM   Modules accepted: Orders

## 2021-06-05 ENCOUNTER — Ambulatory Visit (INDEPENDENT_AMBULATORY_CARE_PROVIDER_SITE_OTHER): Payer: Medicare PPO | Admitting: Psychiatry

## 2021-06-05 ENCOUNTER — Other Ambulatory Visit: Payer: Self-pay

## 2021-06-05 ENCOUNTER — Encounter: Payer: Self-pay | Admitting: Psychiatry

## 2021-06-05 DIAGNOSIS — F3181 Bipolar II disorder: Secondary | ICD-10-CM

## 2021-06-05 DIAGNOSIS — F411 Generalized anxiety disorder: Secondary | ICD-10-CM | POA: Diagnosis not present

## 2021-06-05 NOTE — Progress Notes (Signed)
Sarah Hogan [id number] [birth year] 60 y.o.  Subjective:   Patient ID:  Sarah Hogan is a 60 y.o. (DOB 12-18-1960) female.  Chief Complaint:  Chief Complaint  Patient presents with   Anxiety   Depression    Anxiety    Depression        Associated symptoms include fatigue.  Past medical history includes anxiety.   Sarah Hogan presents to the office today for follow-up of anxiety and mood disturbance. She reports that after about a month of resuming higher dose that her TD has significantly improved. She notices that she is occasionally chewing on her lip. Notices she is occasionally biting her tongue and assumes it is her tongue moving. Jiggles foot when she is sitting and talking.   She reports that increased dose in Geodon has been helpful. Mood has improved "but still depressed." She reports that her mood is consistent with baseline for this time of year. She reports that she has been "tired." She reports "I am getting better at doing the things I should do." She reports that she "always" has anxiety. Avoids watching or talking about the news. No change in anxiety. She enjoys reading. She reports some difficulty with concentration and this is improved with Provigil which she takes on rare occasions. Sleeping well. Appetite has been ok. Has lost 24.8 lbs. Reports binge eating over the weekend. She reports long-standing history of binge eating. Energy is low. Denies SI.   Involved in church.   She reports that her mood declines with the onset of cold weather. She reports that the holidays are difficult for her.   Rarely using Klonopin- typically only when she cannot sleep.   Sarah Hogan been helpful for her depression. Effexor- Taken for several years Buspar Trileptal Tranxene Provigil Trazodone  AIMS    Flowsheet Row Office Visit from 06/05/2021 in [place] Visit from 04/25/2021 in [place] Visit from 10/24/2020 in [place] Visit from 08/29/2020 in [place] Total Score 5 15 1 1       PHQ2-9    [place] Visit from 02/12/2021 in [place] from 12/21/2020 in [place] from 12/21/2019 in [place] from 12/16/2018 in [place] from 12/10/2017 in [place]  PHQ-2 Total Score 0 0 0 0 6  PHQ-9 Total Score -- 4 -- -- 19        Review of Systems:  Review of Systems  Constitutional:  Positive for fatigue.  Musculoskeletal:  Negative for gait problem.  Psychiatric/Behavioral:  Positive for depression.        Please refer to HPI   Medications: I have reviewed the patient's current medications.  Current Outpatient Medications  Medication Sig Dispense Refill   psyllium (METAMUCIL) 58.6 % packet Take 1 packet by mouth daily.     venlafaxine XR (EFFEXOR-XR) 75 MG 24 hr capsule Take 1 capsule (75 mg total) by mouth daily with breakfast. 90 capsule 1   ziprasidone (GEODON) 40 MG capsule Take 1 capsule (40 mg total) by mouth at bedtime. 90 capsule 1   baclofen (LIORESAL) 10 MG tablet Take 20 mg by mouth 3 (three) times daily. Taking one in the morning and 2 tabs at night     Biotin 5000 MCG CAPS Take by mouth.     cholecalciferol (VITAMIN D3) 25 MCG (1000 UNIT) tablet Take 1,000 Units by mouth daily.  Chromium Picolinate (CHROMIUM PICOLATE PO) Take by mouth.     clonazePAM (KLONOPIN) 0.5 MG tablet Take 1 tablet (0.5 mg total) by mouth daily as needed for anxiety. 30 tablet 0   ezetimibe (ZETIA) 10 MG tablet Take 1 tablet (10 mg total) by mouth daily. 90 tablet 1   metoprolol succinate (TOPROL-XL) 25 MG 24 hr tablet Take 1 tablet (25 mg total) by mouth daily. 90 tablet 1   modafinil (PROVIGIL) 100 MG tablet TAKE ONE TABLET BY MOUTH EVERY MORNING 30 tablet 3   MYRBETRIQ 25 MG TB24 tablet Take 25 mg by mouth daily.  3    NONFORMULARY OR COMPOUNDED ITEM Compounded progesterone SR 100mg .  1 capsule daily. 90 each 4   OXcarbazepine (TRILEPTAL) 150 MG tablet Take 1 tablet (150 mg total) by mouth 2 (two) times daily. 180 tablet 1   pregabalin (LYRICA) 50 MG capsule Take 50 mg by mouth daily.     No current facility-administered medications for this visit.    Medication Side Effects: Other: TD  Allergies:  Allergies  Allergen Reactions   Amoxicillin Rash    Past Medical History:  Diagnosis Date   Allergy    Anxiety    Bipolar 2 disorder ([place])    Cervical polyp 12/16/2018   Depression    Former smoker 12/16/2018   Hyperlipidemia    Leukopenia    Mild aortic insufficiency    by echo 07/2016-- Leaky AV    Mild mitral regurgitation    by echo 07/2016   MS (multiple sclerosis) (HCC)    Neuromuscular disorder (HCC)    MS    Normal coronary arteries cath 2015   Overactive bladder    PVC's (premature ventricular contractions) 04/22/2017   SVT (supraventricular tachycardia) ([place]) 04/22/2017   Vitamin D deficiency     Past Medical History, Surgical history, Social history, and Family history were reviewed and updated as appropriate.   Please see review of systems for further details on the patient's review from today.   Objective:   Physical Exam:  LMP 06/28/2008   Physical Exam  Lab Review:     Component Value Date/Time   NA 134 12/21/2020 1156   K 4.6 12/21/2020 1156   CL 97 12/21/2020 1156   CO2 23 12/21/2020 1156   GLUCOSE 97 12/21/2020 1156   GLUCOSE 87 02/26/2017 1126   BUN 11 12/21/2020 1156   CREATININE 1.08 (H) 12/21/2020 1156   CREATININE 1.13 (H) 02/26/2017 1126   CALCIUM 9.5 12/21/2020 1156   PROT 6.6 12/21/2020 1156   ALBUMIN 4.8 12/21/2020 1156   AST 19 12/21/2020 1156   ALT 13 12/21/2020 1156   ALKPHOS 60 12/21/2020 1156   BILITOT 0.4 12/21/2020 1156   GFRNONAA 57 (L) 12/21/2019 1027   GFRAA 66 12/21/2019 1027       Component Value Date/Time   WBC 4.1 12/21/2020  1156   WBC 2.2 (L) 02/26/2017 1126   RBC 4.90 12/21/2020 1156   RBC 4.41 02/26/2017 1126   HGB 13.8 12/21/2020 1156   HCT 41.6 12/21/2020 1156   PLT 207 12/21/2020 1156   MCV 85 12/21/2020 1156   MCH 28.2 12/21/2020 1156   MCH 28.3 02/26/2017 1126   MCHC 33.2 12/21/2020 1156   MCHC 32.7 02/26/2017 1126   RDW 12.2 12/21/2020 1156   LYMPHSABS 1.0 12/21/2020 1156   MONOABS 220 02/26/2017 1126   EOSABS 0.1 12/21/2020 1156   BASOSABS 0.0 12/21/2020 1156    No results found for: POCLITH,  LITHIUM   No results found for: PHENYTOIN, PHENOBARB, VALPROATE, CBMZ   .res Assessment: Plan:   She reports that she would like to continue current medications since TD has improved and mood has returned to baseline for this time of year.  Continue Geodon 40 mg po QHS for Bipolar D/O. Continue Effexor XR 75 mg po qd for depression and anxiety.  Continue Oxcarbazepine 150 mg po BID for mood stabilization.  Continue Klonopin 0.5 mg po qd prn anxiety or insomnia. She reports that she does not need a refill at this time and will contact office if refill is needed prior to next apt.  Has Modafinil remaining and does not refill at this time since she uses it prn on rare occasions.  Pt to follow-up in 3 months or sooner if clinically indicated.  Patient advised to contact office with any questions, adverse effects, or acute worsening in signs and symptoms.   Sarah Hogan was seen today for anxiety and depression.  Diagnoses and all orders for this visit:  Bipolar II disorder, moderate, depressed, with anxious distress ([place])  Generalized anxiety disorder    Please see After Visit Summary for patient specific instructions.  Future Appointments  Date Time Provider [place]  09/04/2021 11:00 AM Thayer Headings, PMHNP CP-CP None  12/25/2021 11:15 AM Henson, Vickie L, PA-C PFM[facility]PFM [facility]    No orders of the defined types were placed in this encounter.   -------------------------------

## 2021-06-28 ENCOUNTER — Other Ambulatory Visit: Payer: Self-pay

## 2021-06-28 MED ORDER — METOPROLOL SUCCINATE ER 25 MG PO TB24
25.0000 mg | ORAL_TABLET | Freq: Every day | ORAL | 1 refills | Status: DC
Start: 2021-06-28 — End: 2022-01-04

## 2021-07-26 ENCOUNTER — Ambulatory Visit (INDEPENDENT_AMBULATORY_CARE_PROVIDER_SITE_OTHER): Payer: No Typology Code available for payment source | Admitting: Medical

## 2021-07-26 ENCOUNTER — Other Ambulatory Visit: Payer: Self-pay

## 2021-07-26 VITALS — BP 130/90 | HR 72 | Temp 97.5°F | Wt 177.2 lb

## 2021-07-26 DIAGNOSIS — J069 Acute upper respiratory infection, unspecified: Secondary | ICD-10-CM

## 2021-07-26 DIAGNOSIS — H68012 Acute Eustachian salpingitis, left ear: Secondary | ICD-10-CM

## 2021-07-26 MED ORDER — SULFAMETHOXAZOLE-TRIMETHOPRIM 800-160 MG PO TABS: 1.0000 | ORAL_TABLET | Freq: Two times a day (BID) | ORAL | 0 refills | Status: DC

## 2021-07-26 NOTE — Progress Notes (Signed)
Subjective:  Sarah Hogan is a 61 y.o. female who presents for Chief Complaint  Patient presents with   left ear pain    Left ear pain x 1 week. Stabbing pain as it comes and goes. Worse when laying down     Here for intermittent pain in left ear.  Worse at night but once she is asleep pain resolved.  Has had multiple sclerosis for 30+ years. Had a cold around Christmas time, still has some runny nose from the cold.  No fever, no NVD.   Stil has some cough.  No sore throat.   No sinus congestion, no headaches.   No ear drainage.  No chest congestion.  No prior impacted cerumen.   No other aggravating or relieving factors.  No other c/o.  Past Medical History:  Diagnosis Date   Allergy    Anxiety    Bipolar 2 disorder ([place])    Cervical polyp 12/16/2018   Depression    Former smoker 12/16/2018   Hyperlipidemia    Leukopenia    Mild aortic insufficiency    by echo 07/2016-- Leaky AV    Mild mitral regurgitation    by echo 07/2016   MS (multiple sclerosis) (HCC)    Neuromuscular disorder (HCC)    MS    Normal coronary arteries cath 2015   Overactive bladder    PVC's (premature ventricular contractions) 04/22/2017   SVT (supraventricular tachycardia) ([place]) 04/22/2017   Vitamin D deficiency    Current Outpatient Medications on File Prior to Visit  Medication Sig Dispense Refill   baclofen (LIORESAL) 10 MG tablet Take 20 mg by mouth 3 (three) times daily. Taking one in the morning and 2 tabs at night     Biotin 5000 MCG CAPS Take by mouth.     cholecalciferol (VITAMIN D3) 25 MCG (1000 UNIT) tablet Take 1,000 Units by mouth daily.     Chromium Picolinate (CHROMIUM PICOLATE PO) Take by mouth.     clonazePAM (KLONOPIN) 0.5 MG tablet Take 1 tablet (0.5 mg total) by mouth daily as needed for anxiety. 30 tablet 0   ezetimibe (ZETIA) 10 MG tablet Take 1 tablet (10 mg total) by mouth daily. 90 tablet 1   metoprolol succinate (TOPROL-XL) 25 MG 24 hr tablet Take 1 tablet (25 mg total) by mouth  daily. 90 tablet 1   modafinil (PROVIGIL) 100 MG tablet TAKE ONE TABLET BY MOUTH EVERY MORNING 30 tablet 3   MYRBETRIQ 25 MG TB24 tablet Take 25 mg by mouth daily.  3   NONFORMULARY OR COMPOUNDED ITEM Compounded progesterone SR 100mg .  1 capsule daily. 90 each 4   OXcarbazepine (TRILEPTAL) 150 MG tablet Take 1 tablet (150 mg total) by mouth 2 (two) times daily. 180 tablet 1   pregabalin (LYRICA) 50 MG capsule Take 50 mg by mouth daily.     psyllium (METAMUCIL) 58.6 % packet Take 1 packet by mouth daily.     venlafaxine XR (EFFEXOR-XR) 75 MG 24 hr capsule Take 1 capsule (75 mg total) by mouth daily with breakfast. 90 capsule 1   ziprasidone (GEODON) 40 MG capsule Take 1 capsule (40 mg total) by mouth at bedtime. 90 capsule 1   No current facility-administered medications on file prior to visit.     The following portions of the patient's history were reviewed and updated as appropriate: allergies, current medications, past family history, past medical history, past social history, past surgical history and problem list.  ROS Otherwise as in subjective above  Objective: BP 130/90    Pulse 72    Temp (!) 97.5 F (36.4 C)    Wt 177 lb 3.2 oz (80.4 kg)    LMP 06/28/2008    SpO2 98%    BMI 29.04 kg/m    BP Readings from Last 3 Encounters:  07/26/21 130/90  05/30/21 (!) 98/52  02/12/21 96/66   General appearance: alert, no distress, well developed, well nourished HEENT: normocephalic, sclerae anicteric, conjunctiva pink and moist, TMs flat, nares patent, no discharge or erythema, pharynx normal Oral cavity: MMM, no lesions Neck: supple, no lymphadenopathy, no thyromegaly, no masses Lungs: CTA bilaterally, no wheezes, rhonchi, or rales    Assessment: Encounter Diagnoses  Name Primary?   Eustachian salpingitis, acute, left Yes   Recent upper respiratory tract infection      Plan: We discussed exam findings and differential.  Advise rest, hydration, begin over-the-counter  Mucinex.  If no improvement within 48 hours then can begin antibiotic below.  Follow-up if not much improved by next week  [name] was seen today for left ear pain.  Diagnoses and all orders for this visit:  Eustachian salpingitis, acute, left  Recent upper respiratory tract infection  Other orders -     sulfamethoxazole-trimethoprim (BACTRIM DS) 800-160 MG tablet; Take 1 tablet by mouth 2 (two) times daily.    Follow up: prn

## 2021-09-04 ENCOUNTER — Other Ambulatory Visit: Payer: Self-pay

## 2021-09-04 ENCOUNTER — Encounter: Payer: Self-pay | Admitting: Psychiatry

## 2021-09-04 ENCOUNTER — Ambulatory Visit: Payer: PRIVATE HEALTH INSURANCE | Admitting: Psychiatry

## 2021-09-04 DIAGNOSIS — F3181 Bipolar II disorder: Secondary | ICD-10-CM

## 2021-09-04 DIAGNOSIS — F411 Generalized anxiety disorder: Secondary | ICD-10-CM

## 2021-09-04 MED ORDER — LAMOTRIGINE 100 MG PO TABS: ORAL_TABLET | ORAL | 0 refills | Status: DC

## 2021-09-04 MED ORDER — OXCARBAZEPINE 150 MG PO TABS: 150.0000 mg | ORAL_TABLET | Freq: Two times a day (BID) | ORAL | 1 refills | Status: DC

## 2021-09-04 MED ORDER — ZIPRASIDONE HCL 40 MG PO CAPS: 40.0000 mg | ORAL_CAPSULE | Freq: Every day | ORAL | 1 refills | Status: DC

## 2021-09-04 MED ORDER — LAMOTRIGINE 25 MG PO TABS: ORAL_TABLET | ORAL | 0 refills | Status: DC

## 2021-09-04 NOTE — Progress Notes (Signed)
Sarah Hogan [id number] Sep 01, 1960 61 y.o.  Subjective:   Patient ID:  Sarah Hogan is a 61 y.o. (DOB 12/28/1960) female.  Chief Complaint:  Chief Complaint  Patient presents with   Medication Problem    Affective dulling   Follow-up    Bipolar D/O and anxiety    HPI Sarah Hogan presents to the office today for follow-up of mood disturbance and anxiety. Denies depression- "I'm disconnected from everything. I am even disconnected from myself." She reports affective dulling. "My house is filthy and I do not care." She reports that she has not had a dog since Thanksgiving and has not made arrangements to get another foster dog. She reports that she takes care of her basic needs. She reports that she has been withdrawn and does not initiate contact. She will engage when others initiate interaction. Started going back to ladies' Bible study for the first time since the fall but is not contributing to discussion. She reports anhedonia. She has had 30 lbs wt loss with [employer]. "I can't get the motivation to do the next right thing." Has had to push herself to do things she normally enjoys. "I'm just existing." Denies any manic s/s. She reports increasing anxiety in response to world events. Sleeping well. She reports poor concentration and will use Provigil to help when she needs to concentrate. Denies SI.  Just returned from a trip to [place]. Went and stayed with a friend and rode down with a friend.   She talks with prayer partners regularly. Does water aerobics 3 times a week. Goes to church and to the store. Going to [employer] meetings. She has been reading and started 4 books and cannot get into any of them. Has been trying to pick up morning devotionals again.   She reports few manic episodes. She report more chronic depression.   [name] been helpful for her depression. Seroquel- Took briefly. Had severe fatigue Effexor- Taken for several years Zoloft- Was effective and then  stopped working Prozac Remeron- Lost 80 lbs when she stopped taking it. Buspar Trileptal- Helps stabilize mood. Has taken long-term. Tranxene Provigil Trazodone  AIMS    Flowsheet Row Office Visit from 09/04/2021 in [place] Office Visit from 06/05/2021 in [place] Visit from 04/25/2021 in [place] Visit from 10/24/2020 in [place] Visit from 08/29/2020 in [place] Total Score 10 5 15 1 1       PHQ2-9    [place] Visit from 02/12/2021 in [place] from 12/21/2020 in [place] from 12/21/2019 in [place] from 12/16/2018 in [place] from 12/10/2017 in [place]  PHQ-2 Total Score 0 0 0 0 6  PHQ-9 Total Score -- 4 -- -- 19        Review of Systems:  Review of Systems  Musculoskeletal:  Negative for gait problem.  Neurological:  Negative for tremors.       Reports occ orthostatic hypotension  Psychiatric/Behavioral:         Please refer to HPI   Medications: I have reviewed the patient's current medications.  Current Outpatient Medications  Medication Sig Dispense Refill   lamoTRIgine (LAMICTAL) 100 MG tablet Take 1 tab po qd x 2 weeks, then increase to 1.5 tabs po qd 45 tablet 0   lamoTRIgine (LAMICTAL) 25 MG tablet Take 1 tablet (25 mg total) by mouth  daily for 14 days, THEN 2 tablets (50 mg total) daily for 14 days. 45 tablet 0   baclofen (LIORESAL) 10 MG tablet Take 20 mg by mouth 3 (three) times daily. Taking one in the morning and 2 tabs at night     Biotin 5000 MCG CAPS Take by mouth.     cholecalciferol (VITAMIN D3) 25 MCG (1000 UNIT) tablet Take 1,000 Units by mouth daily.     Chromium Picolinate (CHROMIUM PICOLATE PO) Take by mouth.     clonazePAM (KLONOPIN) 0.5 MG tablet Take 1 tablet (0.5 mg  total) by mouth daily as needed for anxiety. 30 tablet 0   ezetimibe (ZETIA) 10 MG tablet Take 1 tablet (10 mg total) by mouth daily. 90 tablet 1   metoprolol succinate (TOPROL-XL) 25 MG 24 hr tablet Take 1 tablet (25 mg total) by mouth daily. 90 tablet 1   modafinil (PROVIGIL) 100 MG tablet TAKE ONE TABLET BY MOUTH EVERY MORNING 30 tablet 3   MYRBETRIQ 25 MG TB24 tablet Take 25 mg by mouth daily.  3   NONFORMULARY OR COMPOUNDED ITEM Compounded progesterone SR 100mg .  1 capsule daily. 90 each 4   OXcarbazepine (TRILEPTAL) 150 MG tablet Take 1 tablet (150 mg total) by mouth 2 (two) times daily. 180 tablet 1   pregabalin (LYRICA) 50 MG capsule Take 50 mg by mouth daily.     psyllium (METAMUCIL) 58.6 % packet Take 1 packet by mouth daily.     sulfamethoxazole-trimethoprim (BACTRIM DS) 800-160 MG tablet Take 1 tablet by mouth 2 (two) times daily. 14 tablet 0   venlafaxine XR (EFFEXOR-XR) 75 MG 24 hr capsule Take 1 capsule (75 mg total) by mouth daily with breakfast. 90 capsule 1   ziprasidone (GEODON) 40 MG capsule Take 1 capsule (40 mg total) by mouth at bedtime. 90 capsule 1   No current facility-administered medications for this visit.    Medication Side Effects: Other: Affective dulling, TD  Allergies:  Allergies  Allergen Reactions   Amoxicillin Rash    Past Medical History:  Diagnosis Date   Allergy    Anxiety    Bipolar 2 disorder ([place])    Cervical polyp 12/16/2018   Depression    Former smoker 12/16/2018   Hyperlipidemia    Leukopenia    Mild aortic insufficiency    by echo 07/2016-- Leaky AV    Mild mitral regurgitation    by echo 07/2016   MS (multiple sclerosis) (HCC)    Neuromuscular disorder (HCC)    MS    Normal coronary arteries cath 2015   Overactive bladder    PVC's (premature ventricular contractions) 04/22/2017   SVT (supraventricular tachycardia) ([place]) 04/22/2017   Vitamin D deficiency     Past Medical History, Surgical history, Social history, and Family  history were reviewed and updated as appropriate.   Please see review of systems for further details on the patient's review from today.   Objective:   Physical Exam:  LMP 06/28/2008   Physical Exam Constitutional:      General: She is not in acute distress. Musculoskeletal:        General: No deformity.  Neurological:     Mental Status: She is alert and oriented to person, place, and time.     Coordination: Coordination normal.  Psychiatric:        Attention and Perception: Attention and perception normal. She does not perceive auditory or visual hallucinations.        Mood and Affect: Mood  is not anxious. Affect is not labile, blunt, angry or inappropriate.        Speech: Speech normal.        Behavior: Behavior normal.        Thought Content: Thought content normal. Thought content is not paranoid or delusional. Thought content does not include homicidal or suicidal ideation. Thought content does not include homicidal or suicidal plan.        Cognition and Memory: Cognition and memory normal.        Judgment: Judgment normal.     Comments: Insight intact Dysthymic mood    Lab Review:     Component Value Date/Time   NA 134 12/21/2020 1156   K 4.6 12/21/2020 1156   CL 97 12/21/2020 1156   CO2 23 12/21/2020 1156   GLUCOSE 97 12/21/2020 1156   GLUCOSE 87 02/26/2017 1126   BUN 11 12/21/2020 1156   CREATININE 1.08 (H) 12/21/2020 1156   CREATININE 1.13 (H) 02/26/2017 1126   CALCIUM 9.5 12/21/2020 1156   PROT 6.6 12/21/2020 1156   ALBUMIN 4.8 12/21/2020 1156   AST 19 12/21/2020 1156   ALT 13 12/21/2020 1156   ALKPHOS 60 12/21/2020 1156   BILITOT 0.4 12/21/2020 1156   GFRNONAA 57 (L) 12/21/2019 1027   GFRAA 66 12/21/2019 1027       Component Value Date/Time   WBC 4.1 12/21/2020 1156   WBC 2.2 (L) 02/26/2017 1126   RBC 4.90 12/21/2020 1156   RBC 4.41 02/26/2017 1126   HGB 13.8 12/21/2020 1156   HCT 41.6 12/21/2020 1156   PLT 207 12/21/2020 1156   MCV 85  12/21/2020 1156   MCH 28.2 12/21/2020 1156   MCH 28.3 02/26/2017 1126   MCHC 33.2 12/21/2020 1156   MCHC 32.7 02/26/2017 1126   RDW 12.2 12/21/2020 1156   LYMPHSABS 1.0 12/21/2020 1156   MONOABS 220 02/26/2017 1126   EOSABS 0.1 12/21/2020 1156   BASOSABS 0.0 12/21/2020 1156    No results found for: POCLITH, LITHIUM   No results found for: PHENYTOIN, PHENOBARB, VALPROATE, CBMZ   .res Assessment: Plan:   Pt seen for 30 minutes.  Counseled patient regarding potential benefits, risks, and side effects of Lamictal to include potential risk of Stevens-Johnson syndrome. Advised patient to stop taking Lamictal and contact office immediately if rash develops and to seek urgent medical attention if rash is severe and/or spreading quickly. Will start Lamictal 25 mg daily for 2 weeks, then increase to 50 mg daily for 2 weeks, then 100 mg daily for 2 weeks, then 150 mg daily for mood symptoms.  Discussed considering gradual dose reduction of Geodon once Lamictal is at a therapeutic dose. Discussed considering using compounded dose of 30 mg since she had rebound TD with dose reduction in the past.  Will continue Geodon 40 mg po QHS for mood s/s.  Continue Trileptal 150 mg po BID for mood s/s.  Continue Effexor XR 75 mg po qd for mood s/s.  Pt to follow-up in 8 weeks or sooner if clinically indicated.  Patient advised to contact office with any questions, adverse effects, or acute worsening in signs and symptoms.   Sarah Hogan was seen today for medication problem and follow-up.  Diagnoses and all orders for this visit:  Bipolar II disorder, moderate, depressed, with anxious distress (H[facility]) -     lamoTRIgine (LAMICTAL) 25 MG tablet; Take 1 tablet (25 mg total) by mouth daily for 14 days, THEN 2 tablets (50 mg total) daily for  14 days. -     lamoTRIgine (LAMICTAL) 100 MG tablet; Take 1 tab po qd x 2 weeks, then increase to 1.5 tabs po qd -     OXcarbazepine (TRILEPTAL) 150 MG tablet; Take 1 tablet (150  mg total) by mouth 2 (two) times daily. -     ziprasidone (GEODON) 40 MG capsule; Take 1 capsule (40 mg total) by mouth at bedtime.     Please see After Visit Summary for patient specific instructions.  Future Appointments  Date Time Provider [place]  10/16/2021 11:00 AM Marcellina Millin [facility]  11/09/2021 11:00 AM Thayer Headings, PMHNP CP-CP None    No orders of the defined types were placed in this encounter.   -------------------------------

## 2021-09-25 ENCOUNTER — Telehealth: Payer: Self-pay | Admitting: Physician Assistant

## 2021-09-25 NOTE — Telephone Encounter (Signed)
Spoke with patient to schedule Medicare Annual Wellness Visit (AWV) either virtually or in office. ? ?She wanted to have everything done at one time with provider.  Patient has cpe scheduled for 10/16/21 ?  ?Last AWV 12/21/20 please schedule at anytime with health coach ? ?This should be a 45 minute visit.   ? ?She stated [facility] wanted her to have awv within 3 months of signing up with them ? ? ?

## 2021-09-27 NOTE — Telephone Encounter (Signed)
Please contact patient to schedule a Medicare annual wellness visit with me. Thanks.

## 2021-09-27 NOTE — Telephone Encounter (Signed)
Pt. Scheduled 10/16/21. ?

## 2021-10-02 DIAGNOSIS — G35 Multiple sclerosis: Secondary | ICD-10-CM | POA: Diagnosis not present

## 2021-10-02 DIAGNOSIS — N3941 Urge incontinence: Secondary | ICD-10-CM | POA: Diagnosis not present

## 2021-10-02 DIAGNOSIS — N9489 Other specified conditions associated with female genital organs and menstrual cycle: Secondary | ICD-10-CM | POA: Diagnosis not present

## 2021-10-05 ENCOUNTER — Telehealth: Payer: Self-pay | Admitting: Psychiatry

## 2021-10-05 NOTE — Telephone Encounter (Signed)
Please schedule earlier appt

## 2021-10-05 NOTE — Telephone Encounter (Signed)
Next visit is 11/09/21. Sarah Hogan called to say the new meds she is taking are making her depression worse. Please call her at [phone] to discuss this.  ?

## 2021-10-05 NOTE — Telephone Encounter (Signed)
Please advise her to stop Lamotrigine. Recommend she schedule earlier follow-up apt if possible to discuss alternatives.  ?

## 2021-10-05 NOTE — Telephone Encounter (Signed)
Pt stated she is taking lamotrigine 50 mg for almost 2 weeks now.Since starting the med,she does not see it has helped and seems to be making depression worse.She has no motivation to get out of bed or take a shower,Please advise

## 2021-10-15 NOTE — Progress Notes (Signed)
? ?Complete physical exam ? ? ?Patient: Sarah Hogan   DOB: 03-25-1961   61 y.o. Female  MRN: [id number] ?Visit Date: 10/16/2021 ? ?Chief Complaint  ?Patient presents with  ? Annual Exam  ?  AWV fasting- pt is doing weight watchers and she needs a goal weight.  ? ?Subjective  ?  ?Sarah Hogan is a 61 y.o. female who presents today for a complete physical exam.  ? ?Reports female is generally feeling well; is eating a a healthy diet, following Weight Watchers; is sleeping well about 8 hours a night; drinks several ounces of water a day; is exercising with water aerobics 3 times a week. ? ? ?HPI ?HPI   ? ? Annual Exam   ? Additional comments: AWV fasting- pt is doing weight watchers and she needs a goal weight. ? ?  ?  ?Last edited by Deforest Hoyles, [place] on 10/16/2021 10:57 AM.  ?  ?  ? ? ?Past Medical History:  ?Diagnosis Date  ? Allergy   ? Anxiety   ? Bipolar 2 disorder ([place])   ? Cervical polyp 12/16/2018  ? Depression   ? Former smoker 12/16/2018  ? Hyperlipidemia   ? Leukopenia   ? Mild aortic insufficiency   ? by echo 07/2016-- Leaky AV   ? Mild mitral regurgitation   ? by echo 07/2016  ? MS (multiple sclerosis) ([place])   ? Neuromuscular disorder ([place])   ? MS   ? Normal coronary arteries cath 2015  ? Overactive bladder   ? PVC's (premature ventricular contractions) 04/22/2017  ? SVT (supraventricular tachycardia) ([place]) 04/22/2017  ? Vitamin D deficiency   ? ?Past Surgical History:  ?Procedure Laterality Date  ? BREAST BIOPSY Left 2002  ? CARPAL TUNNEL RELEASE    ? CHOLECYSTECTOMY    ? COLONOSCOPY  2010  ? 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 pigmented squamous cell carcinoma removal    ? POLYPECTOMY  2010  ? 1 polyp- rectum , no path   ? UPPER GASTROINTESTINAL ENDOSCOPY  2010  ? ?Social History  ? ?Socioeconomic History  ? Marital status: Single  ?  Spouse name: Not on file  ? Number of children: Not on file  ? Years of education: Not on file  ? Highest education level: Not on file  ?Occupational History  ? Not  on file  ?Tobacco Use  ? Smoking status: Former  ?  Packs/day: 1.50  ?  Years: 20.00  ?  Pack years: 30.00  ?  Types: Cigarettes  ?  Quit date: 10/28/1994  ?  Years since quitting: 26.9  ? Smokeless tobacco: Never  ?Vaping Use  ? Vaping Use: Never used  ?Substance and Sexual Activity  ? Alcohol use: Yes  ?  Alcohol/week: 0.0 - 1.0 standard drinks  ?  Comment: occ  ? Drug use: No  ? Sexual activity: Not Currently  ?  Comment: since 2008  ?Other Topics Concern  ? Not on file  ?Social History Narrative  ? Not on file  ? ?Social Determinants of Health  ? ?Financial Resource Strain: Not on file  ?Food Insecurity: Not on file  ?Transportation Needs: Not on file  ?Physical Activity: Not on file  ?Stress: Not on file  ?Social Connections: Not on file  ?Intimate Partner Violence: Not on file  ? ?Family Status  ?Relation Name Status  ? Mother  Deceased  ? Father  Deceased  ? Brother  Alive  ? PGM  Deceased  ?  Brother  Alive  ? Neg Hx  (Not Specified)  ? ?Family History  ?Problem Relation Age of Onset  ? Stroke Mother 90  ? Dementia Mother   ? Bipolar disorder Mother   ? Hypertension Brother   ? Hyperlipidemia Brother   ? Obesity Brother   ? Breast cancer Paternal Grandmother   ?     over 43  ? Skin cancer Paternal Grandmother   ? Colon polyps Brother   ? Colon cancer Neg Hx   ? Esophageal cancer Neg Hx   ? Rectal cancer Neg Hx   ? Stomach cancer Neg Hx   ? ?Allergies  ?Allergen Reactions  ? Amoxicillin Rash  ?  ?Patient Care Team: ?Marcellina Millin as PCP - General (Physician Assistant) ?Sueanne Margarita, MD as PCP - Cardiology (Cardiology) ?Megan Salon, MD as Consulting Physician (Gynecology)  ? ?Medications: ?Outpatient Medications Prior to Visit  ?Medication Sig Note  ? baclofen (LIORESAL) 10 MG tablet Take 20 mg by mouth 3 (three) times daily. Taking one in the morning and 2 tabs at night   ? Biotin 5000 MCG CAPS Take by mouth.   ? cholecalciferol (VITAMIN D3) 25 MCG (1000 UNIT) tablet Take 1,000 Units by mouth  daily.   ? Chromium Picolinate (CHROMIUM PICOLATE PO) Take by mouth.   ? clonazePAM (KLONOPIN) 0.5 MG tablet Take 1 tablet (0.5 mg total) by mouth daily as needed for anxiety.   ? metoprolol succinate (TOPROL-XL) 25 MG 24 hr tablet Take 1 tablet (25 mg total) by mouth daily.   ? modafinil (PROVIGIL) 100 MG tablet TAKE ONE TABLET BY MOUTH EVERY MORNING   ? NONFORMULARY OR COMPOUNDED ITEM Compounded progesterone SR 149m.  1 capsule daily.   ? OXcarbazepine (TRILEPTAL) 150 MG tablet Take 1 tablet (150 mg total) by mouth 2 (two) times daily.   ? pregabalin (LYRICA) 50 MG capsule Take 50 mg by mouth daily.   ? psyllium (METAMUCIL) 58.6 % packet Take 1 packet by mouth daily.   ? venlafaxine XR (EFFEXOR-XR) 75 MG 24 hr capsule Take 1 capsule (75 mg total) by mouth daily with breakfast.   ? ziprasidone (GEODON) 40 MG capsule Take 1 capsule (40 mg total) by mouth at bedtime.   ? [DISCONTINUED] ezetimibe (ZETIA) 10 MG tablet Take 1 tablet (10 mg total) by mouth daily. 10/16/2021: Needs new prescription  ? [DISCONTINUED] MYRBETRIQ 25 MG TB24 tablet Take 25 mg by mouth daily.   ? lamoTRIgine (LAMICTAL) 25 MG tablet Take 1 tablet (25 mg total) by mouth daily for 14 days, THEN 2 tablets (50 mg total) daily for 14 days.   ? [DISCONTINUED] lamoTRIgine (LAMICTAL) 100 MG tablet Take 1 tab po qd x 2 weeks, then increase to 1.5 tabs po qd (Patient not taking: Reported on 10/16/2021)   ? [DISCONTINUED] sulfamethoxazole-trimethoprim (BACTRIM DS) 800-160 MG tablet Take 1 tablet by mouth 2 (two) times daily. (Patient not taking: Reported on 10/16/2021)   ? ?No facility-administered medications prior to visit.  ? ? ?Review of Systems  ?Constitutional:  Negative for activity change and fever.  ?HENT:  Negative for congestion, ear pain and voice change.   ?Eyes:  Negative for redness.  ?Respiratory:  Negative for cough.   ?Cardiovascular:  Negative for chest pain.  ?Gastrointestinal:  Negative for constipation and diarrhea.  ?Endocrine:  Negative for polyuria.  ?Genitourinary:  Negative for flank pain.  ?Musculoskeletal:  Negative for gait problem and neck stiffness.  ?Skin:  Negative for color  change and rash.  ?Neurological:  Negative for dizziness.  ?Hematological:  Negative for adenopathy.  ?Psychiatric/Behavioral:  Negative for agitation, behavioral problems and confusion.   ? ?Last CBC ?Lab Results  ?Component Value Date  ? WBC 4.1 12/21/2020  ? HGB 13.8 12/21/2020  ? HCT 41.6 12/21/2020  ? MCV 85 12/21/2020  ? MCH 28.2 12/21/2020  ? RDW 12.2 12/21/2020  ? PLT 207 12/21/2020  ? ?Last metabolic panel ?Lab Results  ?Component Value Date  ? GLUCOSE 97 12/21/2020  ? NA 134 12/21/2020  ? K 4.6 12/21/2020  ? CL 97 12/21/2020  ? CO2 23 12/21/2020  ? BUN 11 12/21/2020  ? CREATININE 1.08 (H) 12/21/2020  ? EGFR 59 (L) 12/21/2020  ? CALCIUM 9.5 12/21/2020  ? PROT 6.6 12/21/2020  ? ALBUMIN 4.8 12/21/2020  ? LABGLOB 1.8 12/21/2020  ? AGRATIO 2.7 (H) 12/21/2020  ? BILITOT 0.4 12/21/2020  ? ALKPHOS 60 12/21/2020  ? AST 19 12/21/2020  ? ALT 13 12/21/2020  ? ?Last lipids ?Lab Results  ?Component Value Date  ? CHOL 203 (H) 12/21/2020  ? HDL 65 12/21/2020  ? LDLCALC 123 (H) 12/21/2020  ? TRIG 82 12/21/2020  ? CHOLHDL 3.1 12/21/2020  ? ?Last hemoglobin A1c ?No results found for: HGBA1C ?Last thyroid functions ?Lab Results  ?Component Value Date  ? TSH 1.310 12/21/2020  ? T3TOTAL 108 12/21/2020  ? ?  ? ?The 10-year ASCVD risk score (Arnett DK, et al., 2019) is: 2.1% ? ? Objective  ?  ?BP 100/70   Pulse (!) 54   Ht 5' 6" (1.676 m)   Wt 174 lb 6.4 oz (79.1 kg)   LMP 06/28/2008   SpO2 100%   BMI 28.15 kg/m?  ? ?Wt Readings from Last 3 Encounters:  ?10/16/21 174 lb 6.4 oz (79.1 kg)  ?07/26/21 177 lb 3.2 oz (80.4 kg)  ?05/30/21 181 lb 3.2 oz (82.2 kg)  ? ? ? ?  ? ? ?Physical Exam ?Vitals and nursing note reviewed.  ?Constitutional:   ?   General: She is not in acute distress. ?   Appearance: Normal appearance. She is not ill-appearing.  ?HENT:  ?   Head:  Normocephalic and atraumatic.  ?   Right Ear: Tympanic membrane, ear canal and external ear normal.  ?   Left Ear: Tympanic membrane, ear canal and external ear normal.  ?   Nose: No congestion.  ?Eyes:  ?   Extraocular Mov

## 2021-10-16 ENCOUNTER — Ambulatory Visit (INDEPENDENT_AMBULATORY_CARE_PROVIDER_SITE_OTHER): Payer: No Typology Code available for payment source | Admitting: Physician Assistant

## 2021-10-16 ENCOUNTER — Ambulatory Visit: Payer: Medicare PPO | Admitting: Physician Assistant

## 2021-10-16 ENCOUNTER — Other Ambulatory Visit: Payer: Self-pay

## 2021-10-16 ENCOUNTER — Encounter: Payer: Self-pay | Admitting: Physician Assistant

## 2021-10-16 VITALS — BP 100/70 | HR 54 | Ht 66.0 in | Wt 174.4 lb

## 2021-10-16 DIAGNOSIS — Z6828 Body mass index (BMI) 28.0-28.9, adult: Secondary | ICD-10-CM | POA: Diagnosis not present

## 2021-10-16 DIAGNOSIS — Z0000 Encounter for general adult medical examination without abnormal findings: Secondary | ICD-10-CM | POA: Diagnosis not present

## 2021-10-16 DIAGNOSIS — K5901 Slow transit constipation: Secondary | ICD-10-CM | POA: Diagnosis not present

## 2021-10-16 DIAGNOSIS — E782 Mixed hyperlipidemia: Secondary | ICD-10-CM

## 2021-10-16 DIAGNOSIS — N3281 Overactive bladder: Secondary | ICD-10-CM | POA: Diagnosis not present

## 2021-10-16 DIAGNOSIS — R7989 Other specified abnormal findings of blood chemistry: Secondary | ICD-10-CM | POA: Diagnosis not present

## 2021-10-16 MED ORDER — MYRBETRIQ 25 MG PO TB24
25.0000 mg | ORAL_TABLET | Freq: Every day | ORAL | 1 refills | Status: DC
Start: 2021-10-16 — End: 2023-10-14

## 2021-10-16 MED ORDER — EZETIMIBE 10 MG PO TABS: 10.0000 mg | ORAL_TABLET | Freq: Every day | ORAL | 1 refills | Status: DC

## 2021-10-16 NOTE — Patient Instructions (Addendum)

## 2021-10-16 NOTE — Assessment & Plan Note (Signed)
Will monitor, CMP drawn today, avoid nephrotoxic medicines like NSAIDS (Advil, Motrin, Ibuprofen, Aleve, Naproxen) ? ?

## 2021-10-16 NOTE — Assessment & Plan Note (Signed)
controlled, continue Zetia 10 mg qd, admits that she has been out of it for 1 month, eat a low fat diet, increase fiber intake (Benefiber or Metamucil, Cherrios,  oatmeal, beans, nuts, fruits and vegetables), limit saturated fats (in fried foods, red meat), can add OTC fish oil supplement, eat fish with Omega-3 fatty acids like salmon and tuna, exercise for 30 minutes 3 - 5 times a week, drink 8 - 10 glasses of water a day ? ? ?

## 2021-10-16 NOTE — Assessment & Plan Note (Signed)
Stable, drink 8 - 10 glasses of water daily, limit sugar intake and eat a low fat diet, exercise 3 - 5 days a week, example walking 1 - 2 miles  ? ?

## 2021-10-16 NOTE — Assessment & Plan Note (Signed)
For constipation drink 8 - 10 glasses of water a day, can try OTC senna laxative or Miralax (polyethylene glycol), can add any OTC probiotic and any OTC digestive enzyme. Also increase fiber with fruits (ex prunes), vegetables, or OTC Benefiber or Citrical. You can go to a store with a pharmacy and ask the staff to show you where to find these items. ? ?

## 2021-10-16 NOTE — Assessment & Plan Note (Signed)
Stable, continue current management, Myrbetriq can be refilled when needed ? ?

## 2021-10-17 LAB — COMPREHENSIVE METABOLIC PANEL
ALT: 11 IU/L (ref 0–32)
AST: 15 IU/L (ref 0–40)
Albumin/Globulin Ratio: 1.9 (ref 1.2–2.2)
Albumin: 4.4 g/dL (ref 3.8–4.8)
Alkaline Phosphatase: 64 IU/L (ref 44–121)
BUN/Creatinine Ratio: 11 — ABNORMAL LOW (ref 12–28)
BUN: 11 mg/dL (ref 8–27)
Bilirubin Total: 0.5 mg/dL (ref 0.0–1.2)
CO2: 21 mmol/L (ref 20–29)
Calcium: 9.6 mg/dL (ref 8.7–10.3)
Chloride: 102 mmol/L (ref 96–106)
Creatinine, Ser: 1.02 mg/dL — ABNORMAL HIGH (ref 0.57–1.00)
Globulin, Total: 2.3 g/dL (ref 1.5–4.5)
Glucose: 88 mg/dL (ref 70–99)
Potassium: 4.2 mmol/L (ref 3.5–5.2)
Sodium: 138 mmol/L (ref 134–144)
Total Protein: 6.7 g/dL (ref 6.0–8.5)
eGFR: 63 mL/min/{1.73_m2} (ref >59)

## 2021-10-17 LAB — CBC WITH DIFFERENTIAL/PLATELET
Basophils Absolute: 0 10*3/uL (ref 0.0–0.2)
Basos: 1 %
EOS (ABSOLUTE): 0.1 10*3/uL (ref 0.0–0.4)
Eos: 2 %
Hematocrit: 44.3 % (ref 34.0–46.6)
Hemoglobin: 14.8 g/dL (ref 11.1–15.9)
Immature Grans (Abs): 0 10*3/uL (ref 0.0–0.1)
Immature Granulocytes: 0 %
Lymphocytes Absolute: 1.2 10*3/uL (ref 0.7–3.1)
Lymphs: 29 %
MCH: 28.8 pg (ref 26.6–33.0)
MCHC: 33.4 g/dL (ref 31.5–35.7)
MCV: 86 fL (ref 79–97)
Monocytes Absolute: 0.3 10*3/uL (ref 0.1–0.9)
Monocytes: 8 %
Neutrophils Absolute: 2.4 10*3/uL (ref 1.4–7.0)
Neutrophils: 60 %
Platelets: 203 10*3/uL (ref 150–450)
RBC: 5.14 x10E6/uL (ref 3.77–5.28)
RDW: 12.2 % (ref 11.7–15.4)
WBC: 4 10*3/uL (ref 3.4–10.8)

## 2021-10-17 LAB — LIPID PANEL
Chol/HDL Ratio: 4.3 ratio (ref 0.0–4.4)
Cholesterol, Total: 282 mg/dL — ABNORMAL HIGH (ref 100–199)
HDL: 65 mg/dL (ref >39)
LDL Chol Calc (NIH): 195 mg/dL — ABNORMAL HIGH (ref 0–99)
Triglycerides: 125 mg/dL (ref 0–149)
VLDL Cholesterol Cal: 22 mg/dL (ref 5–40)

## 2021-10-18 ENCOUNTER — Encounter: Payer: Self-pay | Admitting: Psychiatry

## 2021-10-18 ENCOUNTER — Ambulatory Visit: Payer: No Typology Code available for payment source | Admitting: Psychiatry

## 2021-10-18 DIAGNOSIS — F3181 Bipolar II disorder: Secondary | ICD-10-CM

## 2021-10-18 DIAGNOSIS — F411 Generalized anxiety disorder: Secondary | ICD-10-CM | POA: Diagnosis not present

## 2021-10-18 MED ORDER — ZIPRASIDONE HCL 40 MG PO CAPS: ORAL_CAPSULE | ORAL | 1 refills | Status: DC

## 2021-10-18 NOTE — Progress Notes (Signed)
Sarah Hogan ?[id number] ?Jul 31, 1960 ?61 y.o. ? ?Subjective:  ? ?Patient ID:  Sarah Hogan is a 61 y.o. (DOB 04-11-1961) female. ? ?Chief Complaint:  ?Chief Complaint  ?Patient presents with  ? Depression  ? ? ?Depression ?      ?Sarah Hogan presents to the office today for follow-up of depression. She reports worsening depression with Lamictal- "didn't do things I normally do" like going to Bible study and swim. She reports that since stopping Lamictal- "I'm back where I was." She reports that she has difficulty getting engaged in things. She reports that she has not put some things away from awhile ago. She reports, "I haven't been happy." She reports very low motivation. Energy is ok. Some difficulty falling asleep. Denies sleeping excessively. She reports, "I'm not around people enough to know if I am irritable." Has been socially withdrawn- not calling people or seeing people. She reports that she is taking care of her cat. She is going to buy groceries and eating out. She reports that she has to take Provigil in order to be able to focus to pay bills. Enjoys reading and Bible study. "I feel flat." She reports that she has anxiety and refuses to watch the news. Denies SI.  ? ?Has been binge eating. Has been on Weight watchers and was losing weight until January. She reports, "I've gotten out of my normal eating pattern and I can't get back to it."  ? ? ? ?Sarah Hogan been helpful for her depression. Worsening TD on 20 mg.  ?Seroquel- Took briefly. Had severe fatigue ?Effexor- Taken for several years ?Zoloft- Was effective and then stopped working ?Prozac ?Remeron- Lost 80 lbs when she stopped taking it. ?Buspar ?Trileptal- Helps stabilize mood. Has taken long-term. ?Lamictal-Worsening depression ?Tranxene ?Provigil ?Trazodone ? ? ?AIMS   ? ?[place] Visit from 09/04/2021 in [facility] Visit from 06/05/2021 in [facility] Visit from 04/25/2021 in [facility] Visit from 10/24/2020 in [facility] Visit from 08/29/2020 in [facility]  ?AIMS Total Score '10 5 15 1 1  '$ ? ?  ? ?PHQ2-9   ? ?Flowsheet Row Clinical Support from 10/16/2021 in [facility] Visit from 02/12/2021 in [facility] from 12/21/2020 in [facility] from 12/21/2019 in [facility] from 12/16/2018 in [facility]  ?PHQ-2 Total Score 2 0 0 0 0  ?PHQ-9 Total Score 6 -- 4 -- --  ? ?  ?  ? ?Review of Systems:  ?Review of Systems  ?Musculoskeletal:  Negative for gait problem.  ?Neurological:  Positive for tremors.  ?Psychiatric/Behavioral:  Positive for depression.   ?     Please refer to HPI  ? ?Medications: I have reviewed the patient's current medications. ? ?Current Outpatient Medications  ?Medication Sig Dispense Refill  ? baclofen (LIORESAL) 10 MG tablet Take 20 mg by mouth 3 (three) times daily. Taking one in the morning and 2 tabs at night    ? Biotin 5000 MCG CAPS Take by mouth.    ? cholecalciferol (VITAMIN D3) 25 MCG (1000 UNIT) tablet Take 1,000 Units by mouth daily.    ? Chromium Picolinate (CHROMIUM PICOLATE PO) Take by mouth.    ? clonazePAM (KLONOPIN) 0.5 MG tablet Take 1 tablet (0.5 mg total) by mouth daily as needed for anxiety. 30 tablet 0  ? Fiber 500 MG CAPS Take by mouth.    ? metoprolol succinate (  TOPROL-XL) 25 MG 24 hr tablet Take 1 tablet (25 mg total) by mouth daily. 90 tablet 1  ? modafinil (PROVIGIL) 100 MG tablet TAKE ONE TABLET BY MOUTH EVERY MORNING 30 tablet 3  ? MYRBETRIQ 25 MG TB24 tablet Take 1 tablet (25 mg total) by mouth daily. 90 tablet 1  ? NONFORMULARY OR COMPOUNDED ITEM Compounded progesterone SR '100mg'$ .  1 capsule daily. 90 each 4  ? OXcarbazepine (TRILEPTAL) 150 MG tablet Take 1 tablet (150 mg total) by mouth 2 (two) times daily. 180 tablet 1  ? pregabalin (LYRICA) 50 MG capsule Take 50 mg by  mouth daily.    ? venlafaxine XR (EFFEXOR-XR) 75 MG 24 hr capsule Take 1 capsule (75 mg total) by mouth daily with breakfast. 90 capsule 1  ? ezetimibe (ZETIA) 10 MG tablet Take 1 tablet (10 mg total) by mouth daily. 90 tablet 1  ? ziprasidone (GEODON) 40 MG capsule Compounded script sent to [facility] for 30 mg 90 capsule 1  ? ?No current facility-administered medications for this visit.  ? ? ?Medication Side Effects: Other: TD, Affective dulling ? ?Allergies:  ?Allergies  ?Allergen Reactions  ? Amoxicillin Rash  ? ? ?Past Medical History:  ?Diagnosis Date  ? Allergy   ? Anxiety   ? Bipolar 2 disorder ([place])   ? Cervical polyp 12/16/2018  ? Depression   ? Former smoker 12/16/2018  ? Hyperlipidemia   ? Leukopenia   ? Mild aortic insufficiency   ? by echo 07/2016-- Leaky AV   ? Mild mitral regurgitation   ? by echo 07/2016  ? MS (multiple sclerosis) ([place])   ? Neuromuscular disorder ([place])   ? MS   ? Normal coronary arteries cath 2015  ? Overactive bladder   ? PVC's (premature ventricular contractions) 04/22/2017  ? SVT (supraventricular tachycardia) ([place]) 04/22/2017  ? Vitamin D deficiency   ? ? ?Past Medical History, Surgical history, Social history, and Family history were reviewed and updated as appropriate.  ? ?Please see review of systems for further details on the patient's review from today.  ? ?Objective:  ? ?Physical Exam:  ?LMP 06/28/2008  ? ?Physical Exam ?Constitutional:   ?   General: She is not in acute distress. ?Musculoskeletal:     ?   General: No deformity.  ?Neurological:  ?   Mental Status: She is alert and oriented to person, place, and time.  ?   Coordination: Coordination normal.  ?Psychiatric:     ?   Attention and Perception: Attention and perception normal. She does not perceive auditory or visual hallucinations.     ?   Mood and Affect: Mood is anxious and depressed. Affect is not labile, blunt, angry or inappropriate.     ?   Speech: Speech normal.     ?   Behavior: Behavior normal.  Behavior is cooperative.     ?   Thought Content: Thought content normal. Thought content is not paranoid or delusional. Thought content does not include homicidal or suicidal ideation. Thought content does not include homicidal or suicidal plan.     ?   Cognition and Memory: Cognition and memory normal.     ?   Judgment: Judgment normal.  ?   Comments: Insight intact  ? ? ?Lab Review:  ?   ?Component Value Date/Time  ? NA 138 10/16/2021 1149  ? K 4.2 10/16/2021 1149  ? CL 102 10/16/2021 1149  ? CO2 21 10/16/2021 1149  ? GLUCOSE 88 10/16/2021  1149  ? GLUCOSE 87 02/26/2017 1126  ? BUN 11 10/16/2021 1149  ? CREATININE 1.02 (H) 10/16/2021 1149  ? CREATININE 1.13 (H) 02/26/2017 1126  ? CALCIUM 9.6 10/16/2021 1149  ? PROT 6.7 10/16/2021 1149  ? ALBUMIN 4.4 10/16/2021 1149  ? AST 15 10/16/2021 1149  ? ALT 11 10/16/2021 1149  ? ALKPHOS 64 10/16/2021 1149  ? BILITOT 0.5 10/16/2021 1149  ? GFRNONAA 57 (L) 12/21/2019 1027  ? GFRAA 66 12/21/2019 1027  ? ? ?   ?Component Value Date/Time  ? WBC 4.0 10/16/2021 1149  ? WBC 2.2 (L) 02/26/2017 1126  ? RBC 5.14 10/16/2021 1149  ? RBC 4.41 02/26/2017 1126  ? HGB 14.8 10/16/2021 1149  ? HCT 44.3 10/16/2021 1149  ? PLT 203 10/16/2021 1149  ? MCV 86 10/16/2021 1149  ? MCH 28.8 10/16/2021 1149  ? MCH 28.3 02/26/2017 1126  ? MCHC 33.4 10/16/2021 1149  ? MCHC 32.7 02/26/2017 1126  ? RDW 12.2 10/16/2021 1149  ? LYMPHSABS 1.2 10/16/2021 1149  ? MONOABS 220 02/26/2017 1126  ? EOSABS 0.1 10/16/2021 1149  ? BASOSABS 0.0 10/16/2021 1149  ? ? ?No results found for: POCLITH, LITHIUM  ? ?No results found for: PHENYTOIN, PHENOBARB, VALPROATE, CBMZ  ? ?.res ?Assessment: Plan:   ?Pt seen for 30 minutes and time spent discussing depressive s/s and possible affective dulling. Pt attributes affective dulling to Geodon. Discussed options would be to switch Geodon to another atypical antipsychotic, such as Abilify, or to attempt dose reduction of Geodon. She reports that she would prefer to attempt dose  reduction of Geodon to 30 mg from compounding pharmacy since she had acute worsening in TD in the past with decrease from 40 mg to 20 mg. Discussed that risk of rebound TD would be less with gradual dose redu

## 2021-10-19 ENCOUNTER — Ambulatory Visit: Payer: Medicare PPO | Admitting: Physician Assistant

## 2021-10-30 NOTE — Addendum Note (Signed)
Addended by: Francis Gaines on: 10/30/2021 12:43 PM ? ? Modules accepted: Level of Service ? ?

## 2021-11-09 ENCOUNTER — Ambulatory Visit: Payer: No Typology Code available for payment source | Admitting: Psychiatry

## 2021-11-15 ENCOUNTER — Encounter: Payer: Self-pay | Admitting: Psychiatry

## 2021-11-15 ENCOUNTER — Ambulatory Visit: Payer: No Typology Code available for payment source | Admitting: Psychiatry

## 2021-11-15 DIAGNOSIS — F3181 Bipolar II disorder: Secondary | ICD-10-CM | POA: Diagnosis not present

## 2021-11-15 MED ORDER — ZIPRASIDONE HCL 20 MG PO CAPS: 20.0000 mg | ORAL_CAPSULE | Freq: Every day | ORAL | 1 refills | Status: DC

## 2021-11-15 NOTE — Progress Notes (Signed)
Sarah Hogan ?[id number] ?12/19/1960 ?61 y.o. ? ?Subjective:  ? ?Patient ID:  Sarah Hogan is a 61 y.o. (DOB 03-13-1961) female. ? ?Chief Complaint:  ?Chief Complaint  ?Patient presents with  ? Medication Problem  ?  Affective dulling  ? Depression  ? ? ?HPI ?Sarah Hogan presents to the office today for follow-up of depression. She reports, "I feel a little bit better." She reports, "I still am not doing anything." She plans to join a Bible study/life group on Sunday evenings after saying for awhile that she would not go. Showering more. Talking on the phone to people now. Went everyday to water aerobics this week and last week. Has been going to Bible study more. She reports that she is behind on chores and that her house is in disorder. Doing laundry and and grocery shopping. Describes mood as "even keeled, it's not up and not down." She has been "deeply affected" by friend's loss of a child to suicide. She has not experienced any recent excitement or joy. Denies irritability. She reports that she does not have anxiety as long as she does not watch the news. Sleeping well. Appetite has been good and she has been craving sweets. She has stopped Weight Watchers. Motivation and energy remains low. "All I want to do is sit around and read." She reports that she can concentrate on reading fiction. She has to drink coffee to concentrate to pay bills.  ? ?TD is "worse than it was." She noticed an initial worsening in TD and that it has improved some. She reports that TD is not as severe when she does not have coffee. She stopped caffeine and experienced some withdrawal s/s. She notices decreased concentration with eliminating one coffee daily.  ? ?She has not been able to engage in Bible Study and at lunches. Feels disconnected from others.  ? ?Denies any major stressors.  ? ?[name] been helpful for her depression. Worsening TD on 20 mg.  ?Seroquel- Took briefly. Had severe fatigue ?Effexor- Taken for several  years ?Zoloft- Was effective and then stopped working ?Prozac ?Remeron- Lost 80 lbs when she stopped taking it. ?Buspar ?Trileptal- Helps stabilize mood. Has taken long-term. ?Lamictal-Worsening depression ?Tranxene ?Provigil ?Trazodone ?  ? ?AIMS   ? ?[place] Visit from 09/04/2021 in [facility] Visit from 06/05/2021 in [place] Visit from 04/25/2021 in [facility] Visit from 10/24/2020 in [facility] Visit from 08/29/2020 in [place]  ?AIMS Total Score '10 5 15 1 1  '$ ? ?  ? ?PHQ2-9   ? ?Flowsheet Row Clinical Support from 10/16/2021 in [facility] Visit from 02/12/2021 in  from 12/21/2020 in [place] [facility] from 12/21/2019 in [facility] from 12/16/2018 in [facility]  ?PHQ-2 Total Score 2 0 0 0 0  ?PHQ-9 Total Score 6 -- 4 -- --  ? ?  ?  ? ?Review of Systems:  ?Review of Systems ? ?Medications: I have reviewed the patient's current medications. ? ?Current Outpatient Medications  ?Medication Sig Dispense Refill  ? baclofen (LIORESAL) 10 MG tablet Take 20 mg by mouth 3 (three) times daily. Taking one in the morning and 2 tabs at night    ? Biotin 5000 MCG CAPS Take by mouth.    ? cholecalciferol (VITAMIN D3) 25 MCG (1000 UNIT) tablet Take 1,000 Units by mouth daily.    ? Chromium Picolinate (CHROMIUM PICOLATE  PO) Take by mouth.    ? clonazePAM (KLONOPIN) 0.5 MG tablet Take 1 tablet (0.5 mg total) by mouth daily as needed for anxiety. 30 tablet 0  ? ezetimibe (ZETIA) 10 MG tablet Take 1 tablet (10 mg total) by mouth daily. 90 tablet 1  ? Fiber 500 MG CAPS Take by mouth.    ? metoprolol succinate (TOPROL-XL) 25 MG 24 hr tablet Take 1 tablet (25 mg total) by mouth daily. 90 tablet 1  ? modafinil (PROVIGIL) 100 MG tablet TAKE ONE TABLET BY MOUTH EVERY MORNING 30 tablet 3   ? MYRBETRIQ 25 MG TB24 tablet Take 1 tablet (25 mg total) by mouth daily. 90 tablet 1  ? NONFORMULARY OR COMPOUNDED ITEM Compounded progesterone SR '100mg'$ .  1 capsule daily. 90 each 4  ? OXcarbazepine (TRILEPTAL) 150 MG tablet Take 1 tablet (150 mg total) by mouth 2 (two) times daily. 180 tablet 1  ? pregabalin (LYRICA) 50 MG capsule Take 50 mg by mouth daily.    ? venlafaxine XR (EFFEXOR-XR) 75 MG 24 hr capsule Take 1 capsule (75 mg total) by mouth daily with breakfast. 90 capsule 1  ? ziprasidone (GEODON) 20 MG capsule Take 1 capsule (20 mg total) by mouth at bedtime. 30 capsule 1  ? ziprasidone (GEODON) 40 MG capsule Compounded script sent to [facility] for 30 mg 90 capsule 1  ? ?No current facility-administered medications for this visit.  ? ? ?Medication Side Effects: None ? ?Allergies:  ?Allergies  ?Allergen Reactions  ? Amoxicillin Rash  ? ? ?Past Medical History:  ?Diagnosis Date  ? Allergy   ? Anxiety   ? Bipolar 2 disorder ([place])   ? Cervical polyp 12/16/2018  ? Depression   ? Former smoker 12/16/2018  ? Hyperlipidemia   ? Leukopenia   ? Mild aortic insufficiency   ? by echo 07/2016-- Leaky AV   ? Mild mitral regurgitation   ? by echo 07/2016  ? MS (multiple sclerosis) ([place])   ? Neuromuscular disorder ([place])   ? MS   ? Normal coronary arteries cath 2015  ? Overactive bladder   ? PVC's (premature ventricular contractions) 04/22/2017  ? SVT (supraventricular tachycardia) ([place]) 04/22/2017  ? Vitamin D deficiency   ? ? ?Past Medical History, Surgical history, Social history, and Family history were reviewed and updated as appropriate.  ? ?Please see review of systems for further details on the patient's review from today.  ? ?Objective:  ? ?Physical Exam:  ?LMP 06/28/2008  ? ?Physical Exam ?Constitutional:   ?   General: She is not in acute distress. ?Musculoskeletal:     ?   General: No deformity.  ?Neurological:  ?   Mental Status: She is alert and oriented to person, place, and time.  ?   Coordination:  Coordination normal.  ?Psychiatric:     ?   Attention and Perception: Attention and perception normal. She does not perceive auditory or visual hallucinations.     ?   Mood and Affect: Mood is not anxious. Affect is not labile, blunt, angry or inappropriate.     ?   Speech: Speech normal.     ?   Behavior: Behavior normal.     ?   Thought Content: Thought content normal. Thought content is not paranoid or delusional. Thought content does not include homicidal or suicidal ideation. Thought content does not include homicidal or suicidal plan.     ?   Cognition and Memory: Cognition and memory normal.     ?  Judgment: Judgment normal.  ?   Comments: Insight intact ?Mood presents as less depressed  ? ? ?Lab Review:  ?   ?Component Value Date/Time  ? NA 138 10/16/2021 1149  ? K 4.2 10/16/2021 1149  ? CL 102 10/16/2021 1149  ? CO2 21 10/16/2021 1149  ? GLUCOSE 88 10/16/2021 1149  ? GLUCOSE 87 02/26/2017 1126  ? BUN 11 10/16/2021 1149  ? CREATININE 1.02 (H) 10/16/2021 1149  ? CREATININE 1.13 (H) 02/26/2017 1126  ? CALCIUM 9.6 10/16/2021 1149  ? PROT 6.7 10/16/2021 1149  ? ALBUMIN 4.4 10/16/2021 1149  ? AST 15 10/16/2021 1149  ? ALT 11 10/16/2021 1149  ? ALKPHOS 64 10/16/2021 1149  ? BILITOT 0.5 10/16/2021 1149  ? GFRNONAA 57 (L) 12/21/2019 1027  ? GFRAA 66 12/21/2019 1027  ? ? ?   ?Component Value Date/Time  ? WBC 4.0 10/16/2021 1149  ? WBC 2.2 (L) 02/26/2017 1126  ? RBC 5.14 10/16/2021 1149  ? RBC 4.41 02/26/2017 1126  ? HGB 14.8 10/16/2021 1149  ? HCT 44.3 10/16/2021 1149  ? PLT 203 10/16/2021 1149  ? MCV 86 10/16/2021 1149  ? MCH 28.8 10/16/2021 1149  ? MCH 28.3 02/26/2017 1126  ? MCHC 33.4 10/16/2021 1149  ? MCHC 32.7 02/26/2017 1126  ? RDW 12.2 10/16/2021 1149  ? LYMPHSABS 1.2 10/16/2021 1149  ? MONOABS 220 02/26/2017 1126  ? EOSABS 0.1 10/16/2021 1149  ? BASOSABS 0.0 10/16/2021 1149  ? ? ?No results found for: POCLITH, LITHIUM  ? ?No results found for: PHENYTOIN, PHENOBARB, VALPROATE, CBMZ  ? ?.res ?Assessment:  Plan:   ?Discussed treatment options and she reports that she would like to continue Geodon 30 mg for one more month and then decrease to 20 mg since she initially noticed some worsening in TD after dose reduction

## 2021-11-26 ENCOUNTER — Other Ambulatory Visit: Payer: Self-pay | Admitting: Obstetrics & Gynecology

## 2021-11-26 DIAGNOSIS — Z1231 Encounter for screening mammogram for malignant neoplasm of breast: Secondary | ICD-10-CM

## 2021-12-18 ENCOUNTER — Ambulatory Visit
Admission: RE | Admit: 2021-12-18 | Discharge: 2021-12-18 | Disposition: A | Discharge status: Home / Self Care | Payer: No Typology Code available for payment source | Source: Ambulatory Visit | Attending: Obstetrics & Gynecology | Admitting: Obstetrics & Gynecology

## 2021-12-18 DIAGNOSIS — Z1231 Encounter for screening mammogram for malignant neoplasm of breast: Secondary | ICD-10-CM | POA: Diagnosis not present

## 2021-12-25 ENCOUNTER — Ambulatory Visit: Payer: Medicare PPO | Admitting: Family Medicine

## 2022-01-04 ENCOUNTER — Other Ambulatory Visit: Payer: Self-pay | Admitting: Medical

## 2022-01-10 ENCOUNTER — Encounter: Payer: Self-pay | Admitting: Psychiatry

## 2022-01-10 ENCOUNTER — Ambulatory Visit: Payer: No Typology Code available for payment source | Admitting: Psychiatry

## 2022-01-10 DIAGNOSIS — F3181 Bipolar II disorder: Secondary | ICD-10-CM

## 2022-01-10 MED ORDER — OXCARBAZEPINE 150 MG PO TABS: 150.0000 mg | ORAL_TABLET | Freq: Two times a day (BID) | ORAL | 1 refills | Status: DC

## 2022-01-10 MED ORDER — ZIPRASIDONE HCL 20 MG PO CAPS
20.0000 mg | ORAL_CAPSULE | Freq: Every day | ORAL | 1 refills | Status: DC
Start: 2022-01-10 — End: 2022-04-03

## 2022-01-10 NOTE — Progress Notes (Incomplete)
Sarah Hogan [id number] [birth year] 61 y.o.  Subjective:   Patient ID:  Sarah Hogan is a 61 y.o. (DOB [birth year]) female.  Chief Complaint: No chief complaint on file.   HPI Sarah Hogan presents to the office today for follow-up of mood disturbance and anxiety. She reports that her motivation remains low and is not doing many activities. She reports that her TD has been ok. She notices TD is slightly more after she has caffeine and Modafinil. She denies any change in affective dulling with going to Geodon 20 mg. She reports that she was able to cry recently for the first time in awhile. She reports that she has had difficulty with concentration and had some confusion with her bank account. She reports that she is able to take care of basic tasks and chores (laundry, grocery shopping). Has not been to water aerobics on a regular basis. Going to Bible study regularly.  No change in appetite. Craving sugar more. Sleeping well most nights. Had difficulty falling asleep last night. Denies SI.   She reports that her mood has been lower this week during multiple rainy days. Denies manic symptoms. Denies irritability.   She rescued a Civil engineer, contracting and the dog slipped out of her collar 3 weeks ago and is in the woods near her house. She is trying to lure the dog back to her home by grilling foods for her and leaving food for her. She reports that this has been stressful and possibly negatively affecting her mood. She has had increased anxiety with dog situation.  [name] been helpful for her depression. Worsening TD on 20 mg.  Seroquel- Took briefly. Had severe fatigue Effexor- Taken for several years Zoloft- Was effective and then stopped working Prozac Remeron- Lost 80 lbs when she stopped taking it. Buspar Trileptal- Helps stabilize mood. Has taken long-term. Lamictal-Worsening depression Tranxene Provigil Trazodone    AIMS    Flowsheet Row Office Visit from 09/04/2021 in [facility] Office Visit from 06/05/2021 in [facility] Office Visit from 04/25/2021 in [facility] Visit from 10/24/2020 in [facility] Visit from 08/29/2020 in [facility] Total Score '10 5 15 1 1      '$ PHQ2-9    Flowsheet Row Clinical Support from 10/16/2021 in [facility] Visit from 02/12/2021 in [facility] from 12/21/2020 in [facility] from 12/21/2019 in [facility] from 12/16/2018 in [facility]  PHQ-2 Total Score 2 0 0 0 0  PHQ-9 Total Score 6 -- 4 -- --        Review of Systems:  Review of Systems  Musculoskeletal:  Negative for gait problem.  Neurological:  Negative for tremors.  Psychiatric/Behavioral:         Please refer to HPI    Medications: I have reviewed the patient's current medications.  Current Outpatient Medications  Medication Sig Dispense Refill  . baclofen (LIORESAL) 10 MG tablet Take 20 mg by mouth 3 (three) times daily. Taking one in the morning and 2 tabs at night    . Biotin 5000 MCG CAPS Take by mouth.    . cholecalciferol (VITAMIN D3) 25 MCG (1000 UNIT) tablet Take 1,000 Units by mouth daily.    . Chromium Picolinate (CHROMIUM PICOLATE PO) Take by mouth.    . clonazePAM (KLONOPIN) 0.5 MG tablet Take 1 tablet (0.5 mg total) by mouth daily as needed for anxiety. 30 tablet  0  . ezetimibe (ZETIA) 10 MG tablet Take 1 tablet (10 mg total) by mouth daily. 90 tablet 1  . Fiber 500 MG CAPS Take by mouth.    . metoprolol succinate (TOPROL-XL) 25 MG 24 hr tablet TAKE ONE TABLET BY MOUTH DAILY 90 tablet 2  . modafinil (PROVIGIL) 100 MG tablet TAKE ONE TABLET BY MOUTH EVERY MORNING 30 tablet 3  . MYRBETRIQ 25 MG TB24 tablet Take 1 tablet (25 mg total) by mouth daily. 90 tablet 1  . NONFORMULARY OR COMPOUNDED ITEM Compounded progesterone SR '100mg'$ .  1 capsule  daily. 90 each 4  . OXcarbazepine (TRILEPTAL) 150 MG tablet Take 1 tablet (150 mg total) by mouth 2 (two) times daily. 180 tablet 1  . pregabalin (LYRICA) 50 MG capsule Take 50 mg by mouth daily.    Marland Kitchen venlafaxine XR (EFFEXOR-XR) 75 MG 24 hr capsule Take 1 capsule (75 mg total) by mouth daily with breakfast. 90 capsule 1  . ziprasidone (GEODON) 20 MG capsule Take 1 capsule (20 mg total) by mouth at bedtime. 30 capsule 1  . ziprasidone (GEODON) 40 MG capsule Compounded script sent to [facility] for 30 mg 90 capsule 1   No current facility-administered medications for this visit.    Medication Side Effects: Other: Mild TD  Allergies:  Allergies  Allergen Reactions  . Amoxicillin Rash    Past Medical History:  Diagnosis Date  . Allergy   . Anxiety   . Bipolar 2 disorder ([place])   . Cervical polyp 12/16/2018  . Depression   . Former smoker 12/16/2018  . Hyperlipidemia   . Leukopenia   . Mild aortic insufficiency    by echo 07/2016-- Leaky AV   . Mild mitral regurgitation    by echo 07/2016  . MS (multiple sclerosis) ([place])   . Neuromuscular disorder ([place])    MS   . Normal coronary arteries cath 2015  . Overactive bladder   . PVC's (premature ventricular contractions) 04/22/2017  . SVT (supraventricular tachycardia) ([place]) 04/22/2017  . Vitamin D deficiency     Past Medical History, Surgical history, Social history, and Family history were reviewed and updated as appropriate.   Please see review of systems for further details on the patient's review from today.   Objective:   Physical Exam:  LMP 06/28/2008   Physical Exam  Lab Review:     Component Value Date/Time   NA 138 10/16/2021 1149   K 4.2 10/16/2021 1149   CL 102 10/16/2021 1149   CO2 21 10/16/2021 1149   GLUCOSE 88 10/16/2021 1149   GLUCOSE 87 02/26/2017 1126   BUN 11 10/16/2021 1149   CREATININE 1.02 (H) 10/16/2021 1149   CREATININE 1.13 (H) 02/26/2017 1126   CALCIUM 9.6 10/16/2021 1149   PROT 6.7  10/16/2021 1149   ALBUMIN 4.4 10/16/2021 1149   AST 15 10/16/2021 1149   ALT 11 10/16/2021 1149   ALKPHOS 64 10/16/2021 1149   BILITOT 0.5 10/16/2021 1149   GFRNONAA 57 (L) 12/21/2019 1027   GFRAA 66 12/21/2019 1027       Component Value Date/Time   WBC 4.0 10/16/2021 1149   WBC 2.2 (L) 02/26/2017 1126   RBC 5.14 10/16/2021 1149   RBC 4.41 02/26/2017 1126   HGB 14.8 10/16/2021 1149   HCT 44.3 10/16/2021 1149   PLT 203 10/16/2021 1149   MCV 86 10/16/2021 1149   MCH 28.8 10/16/2021 1149   MCH 28.3 02/26/2017 1126   MCHC 33.4 10/16/2021  1149   MCHC 32.7 02/26/2017 1126   RDW 12.2 10/16/2021 1149   LYMPHSABS 1.2 10/16/2021 1149   MONOABS 220 02/26/2017 1126   EOSABS 0.1 10/16/2021 1149   BASOSABS 0.0 10/16/2021 1149    No results found for: "POCLITH", "LITHIUM"   No results found for: "PHENYTOIN", "PHENOBARB", "VALPROATE", "CBMZ"   .res Assessment: Plan:    There are no diagnoses linked to this encounter.   Please see After Visit Summary for patient specific instructions.  Future Appointments  Date Time Provider [place]  10/21/2022 11:15 AM Irene Pap, PA-C [facility]    No orders of the defined types were placed in this encounter.   -------------------------------

## 2022-02-05 ENCOUNTER — Telehealth: Payer: Self-pay | Admitting: Psychiatry

## 2022-02-05 NOTE — Telephone Encounter (Signed)
Patient called and said she is ready to decrease Geodon to 10 mg. I know Rx needs to go to [facility], but I didn't know if there were special instructions so I have not pended a Rx.

## 2022-02-05 NOTE — Telephone Encounter (Signed)
Sarah Hogan lvm today at 1:10 requesting a Rx for the Ziprasidone.  Next appointment 03/14/22

## 2022-02-05 NOTE — Telephone Encounter (Signed)
Sarah Hogan dropped off an Attending Physicians Statement for her employer. Placed in Traci's desk to complete.

## 2022-02-06 NOTE — Telephone Encounter (Signed)
Hand written script placed in office inbox to be faxed to [facility].

## 2022-02-08 DIAGNOSIS — Z0289 Encounter for other administrative examinations: Secondary | ICD-10-CM

## 2022-02-08 NOTE — Telephone Encounter (Signed)
Form completed and given to Janett Billow to review and sign

## 2022-03-01 ENCOUNTER — Other Ambulatory Visit: Payer: Self-pay | Admitting: Psychiatry

## 2022-03-01 DIAGNOSIS — F3181 Bipolar II disorder: Secondary | ICD-10-CM

## 2022-03-01 DIAGNOSIS — F411 Generalized anxiety disorder: Secondary | ICD-10-CM

## 2022-03-13 DIAGNOSIS — H43813 Vitreous degeneration, bilateral: Secondary | ICD-10-CM | POA: Diagnosis not present

## 2022-03-13 DIAGNOSIS — D3132 Benign neoplasm of left choroid: Secondary | ICD-10-CM | POA: Diagnosis not present

## 2022-03-13 DIAGNOSIS — H2513 Age-related nuclear cataract, bilateral: Secondary | ICD-10-CM | POA: Diagnosis not present

## 2022-03-14 ENCOUNTER — Ambulatory Visit: Payer: No Typology Code available for payment source | Admitting: Psychiatry

## 2022-03-22 ENCOUNTER — Ambulatory Visit: Payer: No Typology Code available for payment source | Admitting: Psychiatry

## 2022-03-27 ENCOUNTER — Encounter: Payer: Self-pay | Admitting: Internal Medicine

## 2022-04-03 ENCOUNTER — Ambulatory Visit: Payer: No Typology Code available for payment source | Admitting: Psychiatry

## 2022-04-03 ENCOUNTER — Encounter: Payer: Self-pay | Admitting: Psychiatry

## 2022-04-03 DIAGNOSIS — F411 Generalized anxiety disorder: Secondary | ICD-10-CM | POA: Diagnosis not present

## 2022-04-03 DIAGNOSIS — F3181 Bipolar II disorder: Secondary | ICD-10-CM

## 2022-04-03 MED ORDER — VENLAFAXINE HCL ER 75 MG PO CP24: ORAL_CAPSULE | ORAL | 0 refills | Status: DC

## 2022-04-03 MED ORDER — ZIPRASIDONE HCL 20 MG PO CAPS: ORAL_CAPSULE | ORAL | 1 refills | Status: DC

## 2022-04-03 NOTE — Progress Notes (Signed)
Sarah Hogan [id number] 1960-08-19 61 y.o.  Subjective:   Patient ID:  Sarah Hogan is a 61 y.o. (DOB 04-02-1961) female.  Chief Complaint:  Chief Complaint  Patient presents with   Medication Problem    Possible affective dulling    HPI Sarah Hogan presents to the office today for follow-up of Bipolar disorder and anxiety. She reports "it's been a bad day...just an emotional day." She has reduced Geodon to 10 mg for about 45 days and "am feeling my emotions a little bit better." Has been crying and able to feel different emotions to include sadness. She reports that she has not been able to experience excitement.  "I don't feel depressed." She reports, "I still don't like people... I don't want to engage." Noticed some irritability today after interacting with someone that others also find annoying. Denies any manic s/s. She reports that her energy and motivation remain low. Sleeping well. Appetite has been "out of control" and eating more fast food and sweets. Has not been wanting to prepare foods. Denies anhedonia. No change in anxiety. Denies SI.   Has a new dog that is a 33 yo toy poodle. She reports that her dog has "helped" and has gotten her out in the community.   She reports that she has a set schedule and routine. She reports that she is busy 5 mornings a week and typically has free time in the afternoons. Remains active in church. Not skipping her usual activities.   She reports that TD has been "ok." Notices she is wiping her mouth after she is eating. Reports TD has been less.   Has not been taking Baclofen and Pregabalin this summer.  Has not needed Klonopin prn recently.   Felt "sick" for 24 hours after reducing Geodon from 20 mg to 10 mg.     Sarah Hogan been helpful for her depression. Worsening TD on 20 mg.  Seroquel- Took briefly. Had severe fatigue Effexor- Taken for several years Zoloft- Was effective and then stopped working Prozac Remeron- Lost 80 lbs when  she stopped taking it. Buspar Trileptal- Helps stabilize mood. Has taken long-term. Lamictal-Worsening depression Tranxene Provigil Trazodone    AIMS    Flowsheet Row Office Visit from 04/03/2022 in [facility] Office Visit from 09/04/2021 in [facility] Office Visit from 06/05/2021 in [facility] Visit from 04/25/2021 in [facility] Visit from 10/24/2020 in [facility] Total Score '7 10 5 15 1      '$ PHQ2-9    Flowsheet Row Clinical Support from 10/16/2021 in [facility] Visit from 02/12/2021 in [facility] from 12/21/2020 in [facility] from 12/21/2019 in [facility] from 12/16/2018 in [facility]  PHQ-2 Total Score 2 0 0 0 0  PHQ-9 Total Score 6 -- 4 -- --        Review of Systems:  Review of Systems  HENT:         Tongue irritation and this may be related to toothpaste.   Gastrointestinal:  Negative for constipation.  Musculoskeletal:  Negative for gait problem.  Neurological:  Negative for tremors.  Psychiatric/Behavioral:         Please refer to HPI    Medications: I have reviewed the patient's current medications.  Current Outpatient Medications  Medication Sig Dispense Refill   Biotin 5000 MCG CAPS Take by mouth.     cholecalciferol (VITAMIN D3) 25  MCG (1000 UNIT) tablet Take 1,000 Units by mouth daily.     Chromium Picolinate (CHROMIUM PICOLATE PO) Take by mouth.     ezetimibe (ZETIA) 10 MG tablet Take 1 tablet (10 mg total) by mouth daily. 90 tablet 1   Fiber 500 MG CAPS Take by mouth.     metoprolol succinate (TOPROL-XL) 25 MG 24 hr tablet TAKE ONE TABLET BY MOUTH DAILY 90 tablet 2   MYRBETRIQ 25 MG TB24 tablet Take 1 tablet (25 mg total) by mouth daily. 90 tablet 1   NONFORMULARY OR COMPOUNDED ITEM Compounded progesterone SR '100mg'$ .  1 capsule  daily. 90 each 4   OXcarbazepine (TRILEPTAL) 150 MG tablet Take 1 tablet (150 mg total) by mouth 2 (two) times daily. 180 tablet 1   baclofen (LIORESAL) 10 MG tablet Take 20 mg by mouth 3 (three) times daily. Taking one in the morning and 2 tabs at night (Patient not taking: Reported on 04/03/2022)     clonazePAM (KLONOPIN) 0.5 MG tablet Take 1 tablet (0.5 mg total) by mouth daily as needed for anxiety. (Patient not taking: Reported on 04/03/2022) 30 tablet 0   modafinil (PROVIGIL) 100 MG tablet TAKE ONE TABLET BY MOUTH EVERY MORNING (Patient not taking: Reported on 04/03/2022) 30 tablet 3   pregabalin (LYRICA) 50 MG capsule Take 50 mg by mouth daily. (Patient not taking: Reported on 04/03/2022)     sulfamethoxazole-trimethoprim (BACTRIM DS) 800-160 MG tablet Take 1 tablet by mouth 2 (two) times daily. 20 tablet 0   venlafaxine XR (EFFEXOR-XR) 75 MG 24 hr capsule TAKE ONE CAPSULE BY MOUTH DAILY WITH BREAKFAST 90 capsule 0   ziprasidone (GEODON) 20 MG capsule Take 5 mg (compounded dose) at bedtime 30 capsule 1   No current facility-administered medications for this visit.    Medication Side Effects: Other: Possible affective dulling  Allergies:  Allergies  Allergen Reactions   Amoxicillin Rash    Past Medical History:  Diagnosis Date   Allergy    Anxiety    Bipolar 2 disorder ([place])    Cervical polyp 12/16/2018   Depression    Former smoker 12/16/2018   Hyperlipidemia    Leukopenia    Mild aortic insufficiency    by echo 07/2016-- Leaky AV    Mild mitral regurgitation    by echo 07/2016   MS (multiple sclerosis) (HCC)    Neuromuscular disorder (HCC)    MS    Normal coronary arteries cath 2015   Overactive bladder    PVC's (premature ventricular contractions) 04/22/2017   SVT (supraventricular tachycardia) ([place]) 04/22/2017   Vitamin D deficiency     Past Medical History, Surgical history, Social history, and Family history were reviewed and updated as appropriate.   Please see  review of systems for further details on the patient's review from today.   Objective:   Physical Exam:  LMP 06/28/2008   Physical Exam Constitutional:      General: She is not in acute distress. Musculoskeletal:        General: No deformity.  Neurological:     Mental Status: She is alert and oriented to person, place, and time.     Coordination: Coordination normal.  Psychiatric:        Attention and Perception: Attention and perception normal. She does not perceive auditory or visual hallucinations.        Mood and Affect: Affect is not labile, blunt, angry or inappropriate.        Speech: Speech normal.  Behavior: Behavior normal.        Thought Content: Thought content normal. Thought content is not paranoid or delusional. Thought content does not include homicidal or suicidal ideation. Thought content does not include homicidal or suicidal plan.        Cognition and Memory: Cognition and memory normal.        Judgment: Judgment normal.     Comments: Insight intact Mood is mildly dysphoric     Lab Review:     Component Value Date/Time   NA 138 10/16/2021 1149   K 4.2 10/16/2021 1149   CL 102 10/16/2021 1149   CO2 21 10/16/2021 1149   GLUCOSE 88 10/16/2021 1149   GLUCOSE 87 02/26/2017 1126   BUN 11 10/16/2021 1149   CREATININE 1.02 (H) 10/16/2021 1149   CREATININE 1.13 (H) 02/26/2017 1126   CALCIUM 9.6 10/16/2021 1149   PROT 6.7 10/16/2021 1149   ALBUMIN 4.4 10/16/2021 1149   AST 15 10/16/2021 1149   ALT 11 10/16/2021 1149   ALKPHOS 64 10/16/2021 1149   BILITOT 0.5 10/16/2021 1149   GFRNONAA 57 (L) 12/21/2019 1027   GFRAA 66 12/21/2019 1027       Component Value Date/Time   WBC 4.0 10/16/2021 1149   WBC 2.2 (L) 02/26/2017 1126   RBC 5.14 10/16/2021 1149   RBC 4.41 02/26/2017 1126   HGB 14.8 10/16/2021 1149   HCT 44.3 10/16/2021 1149   PLT 203 10/16/2021 1149   MCV 86 10/16/2021 1149   MCH 28.8 10/16/2021 1149   MCH 28.3 02/26/2017 1126   MCHC  33.4 10/16/2021 1149   MCHC 32.7 02/26/2017 1126   RDW 12.2 10/16/2021 1149   LYMPHSABS 1.2 10/16/2021 1149   MONOABS 220 02/26/2017 1126   EOSABS 0.1 10/16/2021 1149   BASOSABS 0.0 10/16/2021 1149    No results found for: "POCLITH", "LITHIUM"   No results found for: "PHENYTOIN", "PHENOBARB", "VALPROATE", "CBMZ"   .res Assessment: Plan:   Pt seen for 30 minutes and time spent discussing response to decrease in Geodon and treatment plan. She reports that she has experienced more emotions today and in the recent past, and discussed that this may be related to less affective dulling. She reports that she would like to come of Geodon completely. Will decrease Geodon to 5 mg daily for 2- 4 weeks, then stop since she has had discontinuation s/s with dose decreases in the past. Script sent to [facility] for compounded dose of 5 mg.  Discussed option of starting a different medication to improve mood s/s, however pt prefers not to start a new medication until response to discontinuation of Geodon is known and prefers not to take a different antipsychotic.  Continue Effexor XR 75 mg daily for mood and anxiety.  Continue Oxcarbazepine 150 mg po BID for mood stabilization. Continue Modafinil as needed.  Pt to follow-up in 2 months or sooner if clinically indicated.  Patient advised to contact office with any questions, adverse effects, or acute worsening in signs and symptoms.   Sarah Hogan was seen today for medication problem.  Diagnoses and all orders for this visit:  Bipolar II disorder, moderate, depressed, with anxious distress (HCC) -     venlafaxine XR (EFFEXOR-XR) 75 MG 24 hr capsule; TAKE ONE CAPSULE BY MOUTH DAILY WITH BREAKFAST -     ziprasidone (GEODON) 20 MG capsule; Take 5 mg (compounded dose) at bedtime  Generalized anxiety disorder -     venlafaxine XR (EFFEXOR-XR) 75 MG 24  hr capsule; TAKE ONE CAPSULE BY MOUTH DAILY WITH BREAKFAST     Please see After Visit Summary for  patient specific instructions.  Future Appointments  Date Time Provider [place]  04/12/2022  9:30 AM Caryl Ada PFM-PFM [facility]  05/30/2022 11:30 AM Thayer Headings, PMHNP CP[facility] None    No orders of the defined types were placed in this encounter.   -------------------------------

## 2022-04-05 ENCOUNTER — Telehealth: Payer: Self-pay | Admitting: Psychiatry

## 2022-04-05 ENCOUNTER — Encounter: Payer: Self-pay | Admitting: Family Medicine

## 2022-04-05 ENCOUNTER — Ambulatory Visit (INDEPENDENT_AMBULATORY_CARE_PROVIDER_SITE_OTHER): Payer: No Typology Code available for payment source | Admitting: Family Medicine

## 2022-04-05 VITALS — BP 100/58 | HR 62 | Temp 97.3°F | Wt 199.0 lb

## 2022-04-05 DIAGNOSIS — N3000 Acute cystitis without hematuria: Secondary | ICD-10-CM | POA: Diagnosis not present

## 2022-04-05 DIAGNOSIS — R399 Unspecified symptoms and signs involving the genitourinary system: Secondary | ICD-10-CM

## 2022-04-05 DIAGNOSIS — Z23 Encounter for immunization: Secondary | ICD-10-CM | POA: Diagnosis not present

## 2022-04-05 LAB — POCT URINALYSIS DIP (PROADVANTAGE DEVICE)
Bilirubin, UA: NEGATIVE
Glucose, UA: NEGATIVE mg/dL
Ketones, POC UA: NEGATIVE mg/dL
Nitrite, UA: POSITIVE — AB
Protein Ur, POC: NEGATIVE mg/dL
Specific Gravity, Urine: 1020
Urobilinogen, Ur: 0.2
pH, UA: 6.5 (ref 5.0–8.0)

## 2022-04-05 MED ORDER — SULFAMETHOXAZOLE-TRIMETHOPRIM 800-160 MG PO TABS: 1.0000 | ORAL_TABLET | Freq: Two times a day (BID) | ORAL | 0 refills | Status: DC

## 2022-04-05 NOTE — Progress Notes (Signed)
   Subjective:    Patient ID: Sarah Hogan, female    DOB: 01-31-1961, 61 y.o.   MRN: [id number]  HPI She complains of a 1 week history of difficulty with dysuria, urgency and urinary incontinence.  She has a long history of difficulty with this.   Review of Systems     Objective:   Physical Exam Alert and in no distress.  Urine microscopic showed only occasional red cells.  Dipstick positive for nitrite.       Assessment & Plan:  UTI symptoms - Plan: POCT Urinalysis DIP (Proadvantage Device)  Acute cystitis without hematuria - Plan: sulfamethoxazole-trimethoprim (BACTRIM DS) 800-160 MG tablet She does have equivalent of Azo Standard at home which she will use.  She will call if further trouble.

## 2022-04-08 NOTE — Telephone Encounter (Signed)
Faxed Rx to [facility] for Ziprasidone. Rx shredded

## 2022-04-12 ENCOUNTER — Ambulatory Visit (INDEPENDENT_AMBULATORY_CARE_PROVIDER_SITE_OTHER): Payer: No Typology Code available for payment source | Admitting: Medical

## 2022-04-12 VITALS — BP 108/64 | HR 68 | Temp 98.3°F | Wt 197.2 lb

## 2022-04-12 DIAGNOSIS — R399 Unspecified symptoms and signs involving the genitourinary system: Secondary | ICD-10-CM

## 2022-04-12 LAB — POCT URINALYSIS DIP (PROADVANTAGE DEVICE)
Bilirubin, UA: NEGATIVE
Glucose, UA: NEGATIVE mg/dL
Ketones, POC UA: NEGATIVE mg/dL
Leukocytes, UA: NEGATIVE
Nitrite, UA: NEGATIVE
Protein Ur, POC: NEGATIVE mg/dL
Specific Gravity, Urine: 1.015
Urobilinogen, Ur: NEGATIVE
pH, UA: 6 (ref 5.0–8.0)

## 2022-04-12 NOTE — Progress Notes (Signed)
Subjective:  Sarah Hogan is a 61 y.o. female who presents for Chief Complaint  Patient presents with   follow-up    Follow-up on UTI- having some pain but much better     Here for f/u on UTI.  Was here a week ago with urinary pain, urgency, had microscopic blood in urine.  Was prescribe bactrim, and today is the first day in a week without pain.  Prior to last week, last UTI was 8 months ago.      No fever, no NVD, no body aches or chills.   No visible blood in urine . No vaginal discharge.   Water intake is good.  No other aggravating or relieving factors.    No other c/o.  The following portions of the patient's history were reviewed and updated as appropriate: allergies, current medications, past family history, past medical history, past social history, past surgical history and problem list.  ROS Otherwise as in subjective above    Objective: BP 108/64   Pulse 68   Temp 98.3 F (36.8 C)   Wt 197 lb 3.2 oz (89.4 kg)   LMP 06/28/2008   BMI 31.83 kg/m   General appearance: alert, no distress, well developed, well nourished    Assessment: Encounter Diagnosis  Name Primary?   UTI symptoms Yes     Plan: Urinalysis much improved today compared to a week ago.  Her symptoms have improved and really turned the corner for the better today.  She will finish out the last few days of antibiotics, continue to hydrate well.  The big culprit seems to be increased sugary foods in the diet, occasional stress eating.  We discussed that today.  We discussed that if she gets any other urinary tract infections in the coming months to plan to do a culture at that time for further evaluation.  Sarah Hogan was seen today for follow-up.  Diagnoses and all orders for this visit:  UTI symptoms -     POCT Urinalysis DIP (Proadvantage Device)    Follow up: prn

## 2022-04-30 ENCOUNTER — Encounter: Payer: Self-pay | Admitting: Internal Medicine

## 2022-05-30 ENCOUNTER — Ambulatory Visit (INDEPENDENT_AMBULATORY_CARE_PROVIDER_SITE_OTHER): Payer: No Typology Code available for payment source | Admitting: Psychiatry

## 2022-06-03 ENCOUNTER — Encounter: Payer: Self-pay | Admitting: Family Medicine

## 2022-06-03 ENCOUNTER — Ambulatory Visit (INDEPENDENT_AMBULATORY_CARE_PROVIDER_SITE_OTHER): Payer: No Typology Code available for payment source | Admitting: Family Medicine

## 2022-06-03 VITALS — BP 120/68 | HR 72 | Temp 97.7°F | Ht 66.0 in | Wt 206.6 lb

## 2022-06-03 DIAGNOSIS — N3001 Acute cystitis with hematuria: Secondary | ICD-10-CM | POA: Diagnosis not present

## 2022-06-03 DIAGNOSIS — N3289 Other specified disorders of bladder: Secondary | ICD-10-CM

## 2022-06-03 DIAGNOSIS — R309 Painful micturition, unspecified: Secondary | ICD-10-CM | POA: Diagnosis not present

## 2022-06-03 DIAGNOSIS — R350 Frequency of micturition: Secondary | ICD-10-CM | POA: Diagnosis not present

## 2022-06-03 LAB — POCT URINALYSIS DIP (PROADVANTAGE DEVICE)
Glucose, UA: NEGATIVE mg/dL
Ketones, POC UA: NEGATIVE mg/dL
Nitrite, UA: POSITIVE — AB
Protein Ur, POC: NEGATIVE mg/dL
Specific Gravity, Urine: 1.01
Urobilinogen, Ur: 0.2
pH, UA: 6 (ref 5.0–8.0)

## 2022-06-03 MED ORDER — NITROFURANTOIN MONOHYD MACRO 100 MG PO CAPS: 100.0000 mg | ORAL_CAPSULE | Freq: Two times a day (BID) | ORAL | 0 refills | Status: DC

## 2022-06-03 NOTE — Patient Instructions (Signed)
Stay well hydrated. Take the antibiotics twice daily for a week. Contact us if you have side effects or cannot tolerate the medication.  Hopefully you will be better within 48-72 hours. If you feel worse (fever, flank pain, much more abdominal pain, or other worsening symptoms), please contact us right away. Otherwise, continue the current antibiotic until we get the culture results. If the culture shows that the antibiotic given will not adequately work, then we will send in a new prescription for you.

## 2022-06-03 NOTE — Progress Notes (Signed)
Chief Complaint  Patient presents with   Dysuria    Having pain urination and bladder spasm, started Friday. Also having urinary frequency. Cannot give UA today yet. She has urinary tract pain medication (OTC) that she takes.    11/10 she started with urinary urgency, frequency, dysuria, typical of prior UTI's. She has had some urinary incontinence.  She has MS, chronic issues with her bladder.   She developed spasms, incontinence. She noticed some blood on Friday. Azo helps with the pain and the spasm. Last dose was this morning.  Not having spasms currently, but still has some pain (improved).  No vaginal discharge, odor or itching. No vaginal bleeding.  Last UTI was last month.  No culture was sent. 9/15--small blood, +nitrite, trace leuks. Repeat u/a 9/22, trace blood, otherwise normal. She was treated with Bactrim.   PMH, PSH, SH reviewed  Outpatient Encounter Medications as of 06/03/2022  Medication Sig Note   Biotin 5000 MCG CAPS Take by mouth.    cholecalciferol (VITAMIN D3) 25 MCG (1000 UNIT) tablet Take 1,000 Units by mouth daily.    Chromium Picolinate (CHROMIUM PICOLATE PO) Take by mouth.    clonazePAM (KLONOPIN) 0.5 MG tablet Take 1 tablet (0.5 mg total) by mouth daily as needed for anxiety. 06/03/2022: Took one last night   ezetimibe (ZETIA) 10 MG tablet Take 1 tablet (10 mg total) by mouth daily.    Fiber 500 MG CAPS Take by mouth.    metoprolol succinate (TOPROL-XL) 25 MG 24 hr tablet TAKE ONE TABLET BY MOUTH DAILY    MYRBETRIQ 25 MG TB24 tablet Take 1 tablet (25 mg total) by mouth daily.    NONFORMULARY OR COMPOUNDED ITEM Compounded progesterone SR '100mg'$ .  1 capsule daily.    OXcarbazepine (TRILEPTAL) 150 MG tablet Take 1 tablet (150 mg total) by mouth 2 (two) times daily.    Phenazopyridine HCl (RA URINARY TRACT PAIN RELIEF PO) Take 2 tablets by mouth as needed. 06/03/2022: Took 2 on Friday and 2 today   venlafaxine XR (EFFEXOR-XR) 75 MG 24 hr capsule TAKE ONE  CAPSULE BY MOUTH DAILY WITH BREAKFAST    modafinil (PROVIGIL) 100 MG tablet TAKE ONE TABLET BY MOUTH EVERY MORNING (Patient not taking: Reported on 06/03/2022) 02/12/2021: As needed.    sulfamethoxazole-trimethoprim (BACTRIM DS) 800-160 MG tablet Take 1 tablet by mouth 2 (two) times daily. (Patient not taking: Reported on 06/03/2022)    [DISCONTINUED] baclofen (LIORESAL) 10 MG tablet Take 20 mg by mouth 3 (three) times daily. Taking one in the morning and 2 tabs at night    [DISCONTINUED] ziprasidone (GEODON) 20 MG capsule Take 5 mg by mouth daily. Compounded dose    No facility-administered encounter medications on file as of 06/03/2022.   Allergies  Allergen Reactions   Amoxicillin Rash   ROS: No f/c, n/v/d. +fatigue. +insomnia. Denies flank pain.  +suprapubic "aching". No URI symptoms, chest pain, shortness of breath, rashes, bleeding bruising.    PHYSICAL EXAM:   BP 120/68   Pulse 72   Temp 97.7 F (36.5 C) (Tympanic)   Ht '5\' 6"'$  (1.676 m)   Wt 206 lb 9.6 oz (93.7 kg)   LMP 06/28/2008   BMI 33.35 kg/m   Well-appearing, pleasant female, in good spirits HEENT: conjunctiva and sclera are clear, EOMI. Neck: no lymphadenopathy or mass Heart: regular rate and rhythm Lungs: clear bilaterally. Back: no CVA tenderness Abdomen: soft, nontender  Urine: SG 1.010, moderate blod, nitrite +, 1+ leuks. Mod bili Reported as yellow.  Pt took Azo this morning (reported urine has been "rust colored".  ASSESSMENT/PLAN:  Acute cystitis with hematuria - treat with macrobid. Culture sent.  Contact us if symptoms persist/worsen before culture results are back - Plan: Urine Culture, nitrofurantoin, macrocrystal-monohydrate, (MACROBID) 100 MG capsule  Pain with urination - Plan: POCT Urinalysis DIP (Proadvantage Device)  Urinary frequency - Plan: POCT Urinalysis DIP (Proadvantage Device)  Bladder spasm - Plan: POCT Urinalysis DIP (Proadvantage Device)

## 2022-06-04 ENCOUNTER — Encounter: Payer: Self-pay | Admitting: Internal Medicine

## 2022-06-05 ENCOUNTER — Encounter: Payer: Self-pay | Admitting: Psychiatry

## 2022-06-05 ENCOUNTER — Ambulatory Visit: Payer: No Typology Code available for payment source | Admitting: Psychiatry

## 2022-06-05 DIAGNOSIS — F3181 Bipolar II disorder: Secondary | ICD-10-CM | POA: Diagnosis not present

## 2022-06-05 DIAGNOSIS — F411 Generalized anxiety disorder: Secondary | ICD-10-CM

## 2022-06-05 DIAGNOSIS — G4700 Insomnia, unspecified: Secondary | ICD-10-CM | POA: Diagnosis not present

## 2022-06-05 LAB — URINE CULTURE

## 2022-06-05 MED ORDER — MODAFINIL 100 MG PO TABS: 100.0000 mg | ORAL_TABLET | Freq: Every morning | ORAL | 2 refills | Status: DC

## 2022-06-05 MED ORDER — VENLAFAXINE HCL ER 75 MG PO CP24: ORAL_CAPSULE | ORAL | 0 refills | Status: DC

## 2022-06-05 MED ORDER — TRAZODONE HCL 100 MG PO TABS: ORAL_TABLET | ORAL | 2 refills | Status: DC

## 2022-06-05 MED ORDER — OXCARBAZEPINE 150 MG PO TABS: 150.0000 mg | ORAL_TABLET | Freq: Two times a day (BID) | ORAL | 1 refills | Status: DC

## 2022-06-05 NOTE — Progress Notes (Signed)
Doratha Mcswain [id number] 11/29/1960 61 y.o.  Subjective:   Patient ID:  Timothy Townsel is a 61 y.o. (DOB 08-22-1960) female.  Chief Complaint:  Chief Complaint  Patient presents with   Fatigue   Anxiety    HPI Reilley Valentine presents to the office today for follow-up of Bipolar disorder and anxiety. She reports, "I am feeling so much better... my head is clear... I am talking with people" and socializing more. She is no longer feeling flat or having affective dulling.  She has not yet been able to cry. She reports that she had diarrhea, upset stomach, and headache for a week after stopping Geodon 5 mg daily for 2 weeks.   She reports that she has not been sleeping. She started taking an old prescription of Trazodone 50 mg again. She reports that Trazodone is effective for her insomnia. She reports that it takes several hours to fall asleep. She is now getting 6 hours of sleep a night.   She did not do her usual activities last week because she was unable to sleep and was exhausted. She reports that her energy and motivation remain low. She reports that she is behind on her chores.   She denies depressed mood. Increased interest in things. Enjoying her dog. She has anxiety in response to world events and upcoming holidays. She avoids the news and asks people not to talk with her about the news. She reports that she has been binge eating sugar to the point of becoming physically sick. She reports that she has gained weight and is unable to fit into her usual clothes. Denies any manic s/s. Denies SI.   She reports that TD is "lighter."     [name] been helpful for her depression. Worsening TD on 20 mg.  Latuda- Effective. Took samples.  Seroquel- Took briefly. Had severe fatigue Effexor- Taken for several years Zoloft- Was effective and then stopped working Prozac Remeron- Lost 80 lbs when she stopped taking it. Buspar Trileptal- Helps stabilize mood. Has taken  long-term. Lamictal-Worsening depression Tranxene Provigil Trazodone    AIMS    Flowsheet Row Office Visit from 06/05/2022 in [facility] Office Visit from 04/03/2022 in [facility] Office Visit from 09/04/2021 in [place] Visit from 06/05/2021 in [facility] Visit from 04/25/2021 in [facility] [place] Total Score '6 7 10 5 15      '$ PHQ2-9    Flowsheet Row Clinical Support from 10/16/2021 in [facility] Visit from 02/12/2021 in [facility] from 12/21/2020 in [facility] from 12/21/2019 in [facility] from 12/16/2018 in [facility]  PHQ-2 Total Score 2 0 0 0 0  PHQ-9 Total Score 6 -- 4 -- --        Review of Systems:  Review of Systems  Genitourinary:        Current UTI  Musculoskeletal:  Negative for gait problem.  Neurological:  Negative for tremors.  Psychiatric/Behavioral:         Please refer to HPI    Medications: I have reviewed the patient's current medications.  Current Outpatient Medications  Medication Sig Dispense Refill   ezetimibe (ZETIA) 10 MG tablet Take 1 tablet (10 mg total) by mouth daily. 90 tablet 1   Fiber 500 MG CAPS Take by mouth.     metoprolol succinate (TOPROL-XL) 25 MG 24 hr tablet TAKE ONE TABLET BY MOUTH DAILY 90 tablet  2   MYRBETRIQ 25 MG TB24 tablet Take 1 tablet (25 mg total) by mouth daily. 90 tablet 1   nitrofurantoin, macrocrystal-monohydrate, (MACROBID) 100 MG capsule Take 1 capsule (100 mg total) by mouth 2 (two) times daily. 14 capsule 0   NONFORMULARY OR COMPOUNDED ITEM Compounded progesterone SR '100mg'$ .  1 capsule daily. 90 each 4   traZODone (DESYREL) 100 MG tablet Take 1/2-1 tablet po QHS prn insomnia 30 tablet 2   Biotin 5000 MCG CAPS Take by mouth.     cholecalciferol (VITAMIN D3) 25 MCG (1000 UNIT) tablet Take  1,000 Units by mouth daily.     Chromium Picolinate (CHROMIUM PICOLATE PO) Take by mouth.     clonazePAM (KLONOPIN) 0.5 MG tablet Take 1 tablet (0.5 mg total) by mouth daily as needed for anxiety. (Patient not taking: Reported on 06/05/2022) 30 tablet 0   modafinil (PROVIGIL) 100 MG tablet Take 1 tablet (100 mg total) by mouth every morning. 30 tablet 2   OXcarbazepine (TRILEPTAL) 150 MG tablet Take 1 tablet (150 mg total) by mouth 2 (two) times daily. 180 tablet 1   Phenazopyridine HCl (RA URINARY TRACT PAIN RELIEF PO) Take 2 tablets by mouth as needed. (Patient not taking: Reported on 06/05/2022)     sulfamethoxazole-trimethoprim (BACTRIM DS) 800-160 MG tablet Take 1 tablet by mouth 2 (two) times daily. (Patient not taking: Reported on 06/03/2022) 20 tablet 0   venlafaxine XR (EFFEXOR-XR) 75 MG 24 hr capsule TAKE ONE CAPSULE BY MOUTH DAILY WITH BREAKFAST 90 capsule 0   No current facility-administered medications for this visit.    Medication Side Effects: None  Allergies:  Allergies  Allergen Reactions   Amoxicillin Rash    Past Medical History:  Diagnosis Date   Allergy    Anxiety    Bipolar 2 disorder ([place])    Cervical polyp 12/16/2018   Depression    Former smoker 12/16/2018   Hyperlipidemia    Leukopenia    Mild aortic insufficiency    by echo 07/2016-- Leaky AV    Mild mitral regurgitation    by echo 07/2016   MS (multiple sclerosis) (HCC)    Neuromuscular disorder (HCC)    MS    Normal coronary arteries cath 2015   Overactive bladder    PVC's (premature ventricular contractions) 04/22/2017   SVT (supraventricular tachycardia) 04/22/2017   Vitamin D deficiency     Past Medical History, Surgical history, Social history, and Family history were reviewed and updated as appropriate.   Please see review of systems for further details on the patient's review from today.   Objective:   Physical Exam:  LMP 06/28/2008   Physical Exam Constitutional:      General: She  is not in acute distress. Musculoskeletal:        General: No deformity.  Neurological:     Mental Status: She is alert and oriented to person, place, and time.     Coordination: Coordination normal.  Psychiatric:        Attention and Perception: Attention and perception normal. She does not perceive auditory or visual hallucinations.        Mood and Affect: Mood is anxious. Affect is not labile, blunt, angry or inappropriate.        Speech: Speech normal.        Behavior: Behavior normal.        Thought Content: Thought content normal. Thought content is not paranoid or delusional. Thought content does not include homicidal or suicidal ideation.  Thought content does not include homicidal or suicidal plan.        Cognition and Memory: Cognition and memory normal.        Judgment: Judgment normal.     Comments: Insight intact Mood is mildly depressed     Lab Review:     Component Value Date/Time   NA 138 10/16/2021 1149   K 4.2 10/16/2021 1149   CL 102 10/16/2021 1149   CO2 21 10/16/2021 1149   GLUCOSE 88 10/16/2021 1149   GLUCOSE 87 02/26/2017 1126   BUN 11 10/16/2021 1149   CREATININE 1.02 (H) 10/16/2021 1149   CREATININE 1.13 (H) 02/26/2017 1126   CALCIUM 9.6 10/16/2021 1149   PROT 6.7 10/16/2021 1149   ALBUMIN 4.4 10/16/2021 1149   AST 15 10/16/2021 1149   ALT 11 10/16/2021 1149   ALKPHOS 64 10/16/2021 1149   BILITOT 0.5 10/16/2021 1149   GFRNONAA 57 (L) 12/21/2019 1027   GFRAA 66 12/21/2019 1027       Component Value Date/Time   WBC 4.0 10/16/2021 1149   WBC 2.2 (L) 02/26/2017 1126   RBC 5.14 10/16/2021 1149   RBC 4.41 02/26/2017 1126   HGB 14.8 10/16/2021 1149   HCT 44.3 10/16/2021 1149   PLT 203 10/16/2021 1149   MCV 86 10/16/2021 1149   MCH 28.8 10/16/2021 1149   MCH 28.3 02/26/2017 1126   MCHC 33.4 10/16/2021 1149   MCHC 32.7 02/26/2017 1126   RDW 12.2 10/16/2021 1149   LYMPHSABS 1.2 10/16/2021 1149   MONOABS 220 02/26/2017 1126   EOSABS 0.1  10/16/2021 1149   BASOSABS 0.0 10/16/2021 1149    No results found for: "POCLITH", "LITHIUM"   No results found for: "PHENYTOIN", "PHENOBARB", "VALPROATE", "CBMZ"   .res Assessment: Plan:    Pt seen for 30 minutes and time spent discussing response to decrease and discontinuation of Geodon. She reports that overall she feels more "clear-headed" and is better able to engage socially. She reports that her energy remains low and this may also be due to current UTI. She requests script for Modafinil prn to help with energy and motivation. She also requests script for Trazodone since she has noticed some improvement in her sleep after re-starting Trazodone from previous script. Discussed potential benefits, risks, and side effects of Trazodone. Will start Trazodone 100 mg 1/2-1 tablet at bedtime as needed for insomnia.  Discussed alternative treatment options, to include latuda, if she continues to have some mood symptoms that are not fully controlled. She reports that she responded well to [place] samples in the past, however her insurance did not cover Latuda at the time. Discussed that generic Anette Guarneri is now available and cost has decreased significantly. Pt reports that she may consider re-starting Latuda if she experiences depression this winter.  Continue Modafinil 100 mg po qd prn low energy.  Continue Trileptal 150 mg po BID for mood stabilization.  Continue Effexor XR 75 mg daily for anxiety and depression.  Pt to follow-up in 2 months or sooner if clinically indicated.  Patient advised to contact office with any questions, adverse effects, or acute worsening in signs and symptoms.    Mika was seen today for fatigue and anxiety.  Diagnoses and all orders for this visit:  Bipolar II disorder, moderate, depressed, with anxious distress (HCC) -     venlafaxine XR (EFFEXOR-XR) 75 MG 24 hr capsule; TAKE ONE CAPSULE BY MOUTH DAILY WITH BREAKFAST -     OXcarbazepine (TRILEPTAL) 150 MG tablet;  Take 1 tablet (150 mg total) by mouth 2 (two) times daily.  Generalized anxiety disorder -     venlafaxine XR (EFFEXOR-XR) 75 MG 24 hr capsule; TAKE ONE CAPSULE BY MOUTH DAILY WITH BREAKFAST  Insomnia, unspecified type -     traZODone (DESYREL) 100 MG tablet; Take 1/2-1 tablet po QHS prn insomnia  Other orders -     modafinil (PROVIGIL) 100 MG tablet; Take 1 tablet (100 mg total) by mouth every morning.     Please see After Visit Summary for patient specific instructions.  Future Appointments  Date Time Provider [place]  08/05/2022 11:30 AM Thayer Headings, PMHNP [facility] None  10/22/2022 10:30 AM Early, Coralee Pesa, NP [facility]    No orders of the defined types were placed in this encounter.   -------------------------------

## 2022-06-06 ENCOUNTER — Telehealth: Payer: Self-pay | Admitting: Physician Assistant

## 2022-06-06 NOTE — Telephone Encounter (Signed)
Pt called and requested recent urine reports be sent to Dr. Landry Mellow with [facility] be sent for continuity of care. Pt was identified with 3 factors and information faxed to [phone] per patients verbal instructions.

## 2022-06-10 ENCOUNTER — Other Ambulatory Visit (HOSPITAL_BASED_OUTPATIENT_CLINIC_OR_DEPARTMENT_OTHER): Payer: Self-pay | Admitting: Obstetrics & Gynecology

## 2022-06-10 DIAGNOSIS — Z780 Asymptomatic menopausal state: Secondary | ICD-10-CM

## 2022-06-10 MED ORDER — NONFORMULARY OR COMPOUNDED ITEM: 1 refills | Status: DC

## 2022-06-11 DIAGNOSIS — N3281 Overactive bladder: Secondary | ICD-10-CM | POA: Diagnosis not present

## 2022-06-11 DIAGNOSIS — N3941 Urge incontinence: Secondary | ICD-10-CM | POA: Diagnosis not present

## 2022-06-11 DIAGNOSIS — R350 Frequency of micturition: Secondary | ICD-10-CM | POA: Diagnosis not present

## 2022-06-11 DIAGNOSIS — R300 Dysuria: Secondary | ICD-10-CM | POA: Diagnosis not present

## 2022-06-16 ENCOUNTER — Other Ambulatory Visit: Payer: Self-pay | Admitting: Physician Assistant

## 2022-08-05 ENCOUNTER — Encounter: Payer: Self-pay | Admitting: Psychiatry

## 2022-08-05 ENCOUNTER — Telehealth (INDEPENDENT_AMBULATORY_CARE_PROVIDER_SITE_OTHER): Payer: PRIVATE HEALTH INSURANCE | Admitting: Psychiatry

## 2022-08-05 DIAGNOSIS — F411 Generalized anxiety disorder: Secondary | ICD-10-CM

## 2022-08-05 DIAGNOSIS — F3181 Bipolar II disorder: Secondary | ICD-10-CM

## 2022-08-05 DIAGNOSIS — G4700 Insomnia, unspecified: Secondary | ICD-10-CM

## 2022-08-05 MED ORDER — OXCARBAZEPINE 150 MG PO TABS: 150.0000 mg | ORAL_TABLET | Freq: Two times a day (BID) | ORAL | 1 refills | Status: DC

## 2022-08-05 MED ORDER — VENLAFAXINE HCL ER 75 MG PO CP24: ORAL_CAPSULE | ORAL | 0 refills | Status: DC

## 2022-08-05 NOTE — Progress Notes (Signed)
Sarah Hogan [id number] Nov 29, 1960 62 y.o.  Virtual Visit via Video Note  I connected with pt @ on 08/05/22 at 11:30 AM EST by a video enabled telemedicine application and verified that I am speaking with the correct person using two identifiers.   I discussed the limitations of evaluation and management by telemedicine and the availability of in person appointments. The patient expressed understanding and agreed to proceed.  I discussed the assessment and treatment plan with the patient. The patient was provided an opportunity to ask questions and all were answered. The patient agreed with the plan and demonstrated an understanding of the instructions.   The patient was advised to call back or seek an in-person evaluation if the symptoms worsen or if the condition fails to improve as anticipated.  I provided 17 minutes of non-face-to-face time during this encounter.  The patient was located at home.  The provider was located at home.  Thayer Headings, PMHNP   Subjective:   Patient ID:  Sarah Hogan is a 62 y.o. (DOB 13-Oct-1960) female.  Chief Complaint:  Chief Complaint  Patient presents with   Follow-up    Mood disturbance, Anxiety, and sleep disturbance    HPI Sarah Hogan presents for follow-up of Bipolar disorder and anxiety. She reports that she has been "wanting to sleep... I just don't want to get up in the morning." She reports, "it's normal for me in January." She reports that usually the end of January her energy, motivation, and sleep improve. Denies depressed mood- "I'm actually feeling very good." She reports that December was "very busy." Energy and motivation have been low- "I'm not doing anything but reading." She skipped water aerobics this mornings. She has been going to church and Bible study. She reports, "I'm doing what I am supposed to do and staying in touch with people." Enjoying being around friends. She reports that she had a "great Christmas." Denies anhedonia  or affective dulling. Denies any worsening anxiety. Anxiety is usually in response to the news and politics. She reports over-eating during the holidays- "still over-eating." She reports that she ate out frequently during December and ate baked goods that friends and neighbors have brought her. She has bought groceries and is trying to eat at home now instead of going out. "I'm functioning." She reports that she went shopping for the holidays, which is not typical for her. She reports that her concentration has "not been very good." She forgot to empty coffee pots at church. She recently cleared off papers from her desk. She reports that she is reading frequently." Denies SI.   Enjoys her dog (Max) and Dentist). Max is now 8 lbs.   Some of her family members have had COVID and other illnesses.   She reports that she has not used Modafinil recently since it seems to cause some sleep disturbance and was minimally effective.   [name] been helpful for her depression. Worsening TD on 20 mg.  Latuda- Effective. Took samples.  Seroquel- Took briefly. Had severe fatigue Effexor- Taken for several years Zoloft- Was effective and then stopped working Prozac Remeron- Lost 80 lbs when she stopped taking it. Buspar Trileptal- Helps stabilize mood. Has taken long-term. Lamictal-Worsening depression Tranxene Provigil Trazodone  Review of Systems:  Review of Systems  Constitutional:  Negative for fever.  Musculoskeletal:  Negative for gait problem.  Psychiatric/Behavioral:         Please refer to HPI    Medications: I have reviewed the patient's current medications.  Current Outpatient Medications  Medication Sig Dispense Refill   Biotin 5000 MCG CAPS Take by mouth.     cholecalciferol (VITAMIN D3) 25 MCG (1000 UNIT) tablet Take 1,000 Units by mouth daily.     Chromium Picolinate (CHROMIUM PICOLATE PO) Take by mouth.     ezetimibe (ZETIA) 10 MG tablet TAKE ONE TABLET BY MOUTH DAILY 90  tablet 1   Fiber 500 MG CAPS Take by mouth.     metoprolol succinate (TOPROL-XL) 25 MG 24 hr tablet TAKE ONE TABLET BY MOUTH DAILY 90 tablet 2   MYRBETRIQ 25 MG TB24 tablet Take 1 tablet (25 mg total) by mouth daily. 90 tablet 1   NONFORMULARY OR COMPOUNDED ITEM Compounded progesterone SR '100mg'$ .  1 capsule daily. 90 each 1   UNABLE TO FIND Med Name: NAC 600 mg BID     clonazePAM (KLONOPIN) 0.5 MG tablet Take 1 tablet (0.5 mg total) by mouth daily as needed for anxiety. (Patient not taking: Reported on 06/05/2022) 30 tablet 0   modafinil (PROVIGIL) 100 MG tablet Take 1 tablet (100 mg total) by mouth every morning. (Patient not taking: Reported on 08/05/2022) 30 tablet 2   OXcarbazepine (TRILEPTAL) 150 MG tablet Take 1 tablet (150 mg total) by mouth 2 (two) times daily. 180 tablet 1   traZODone (DESYREL) 100 MG tablet Take 1/2-1 tablet po QHS prn insomnia (Patient not taking: Reported on 08/05/2022) 30 tablet 2   venlafaxine XR (EFFEXOR-XR) 75 MG 24 hr capsule TAKE ONE CAPSULE BY MOUTH DAILY WITH BREAKFAST 90 capsule 0   No current facility-administered medications for this visit.    Medication Side Effects: None  Allergies:  Allergies  Allergen Reactions   Amoxicillin Rash    Past Medical History:  Diagnosis Date   Allergy    Anxiety    Bipolar 2 disorder ([place])    Cervical polyp 12/16/2018   Depression    Former smoker 12/16/2018   Hyperlipidemia    Leukopenia    Mild aortic insufficiency    by echo 07/2016-- Leaky AV    Mild mitral regurgitation    by echo 07/2016   MS (multiple sclerosis) (HCC)    Neuromuscular disorder (HCC)    MS    Normal coronary arteries cath 2015   Overactive bladder    PVC's (premature ventricular contractions) 04/22/2017   SVT (supraventricular tachycardia) 04/22/2017   Vitamin D deficiency     Family History  Problem Relation Age of Onset   Stroke Mother 26   Dementia Mother    Bipolar disorder Mother    Hypertension Brother    Hyperlipidemia  Brother    Obesity Brother    Breast cancer Paternal Grandmother        over 28   Skin cancer Paternal Grandmother    Colon polyps Brother    Colon cancer Neg Hx    Esophageal cancer Neg Hx    Rectal cancer Neg Hx    Stomach cancer Neg Hx     Social History   Socioeconomic History   Marital status: Single    Spouse name: Not on file   Number of children: Not on file   Years of education: Not on file   Highest education level: Not on file  Occupational History   Not on file  Tobacco Use   Smoking status: Former    Packs/day: 1.50    Years: 20.00    Total pack years: 30.00    Types: Cigarettes    Quit date:  10/28/1994    Years since quitting: 27.7   Smokeless tobacco: Never  Vaping Use   Vaping Use: Never used  Substance and Sexual Activity   Alcohol use: Yes    Alcohol/week: 0.0 - 1.0 standard drinks of alcohol    Comment: occ   Drug use: No   Sexual activity: Not Currently    Comment: since 2008  Other Topics Concern   Not on file  Social History Narrative   Not on file   Social Determinants of Health   Financial Resource Strain: Not on file  Food Insecurity: Not on file  Transportation Needs: Not on file  Physical Activity: Not on file  Stress: Not on file  Social Connections: Not on file  Intimate Partner Violence: Not on file    Past Medical History, Surgical history, Social history, and Family history were reviewed and updated as appropriate.   Please see review of systems for further details on the patient's review from today.   Objective:   Physical Exam:  LMP 06/28/2008   Physical Exam Constitutional:      General: She is not in acute distress. Musculoskeletal:        General: No deformity.  Neurological:     Mental Status: She is alert and oriented to person, place, and time.     Coordination: Coordination normal.  Psychiatric:        Attention and Perception: Attention and perception normal. She does not perceive auditory or visual  hallucinations.        Mood and Affect: Mood normal. Mood is not anxious or depressed. Affect is not labile, blunt, angry or inappropriate.        Speech: Speech normal.        Behavior: Behavior normal.        Thought Content: Thought content normal. Thought content is not paranoid or delusional. Thought content does not include homicidal or suicidal ideation. Thought content does not include homicidal or suicidal plan.        Cognition and Memory: Cognition and memory normal.        Judgment: Judgment normal.     Comments: Insight intact     Lab Review:     Component Value Date/Time   NA 138 10/16/2021 1149   K 4.2 10/16/2021 1149   CL 102 10/16/2021 1149   CO2 21 10/16/2021 1149   GLUCOSE 88 10/16/2021 1149   GLUCOSE 87 02/26/2017 1126   BUN 11 10/16/2021 1149   CREATININE 1.02 (H) 10/16/2021 1149   CREATININE 1.13 (H) 02/26/2017 1126   CALCIUM 9.6 10/16/2021 1149   PROT 6.7 10/16/2021 1149   ALBUMIN 4.4 10/16/2021 1149   AST 15 10/16/2021 1149   ALT 11 10/16/2021 1149   ALKPHOS 64 10/16/2021 1149   BILITOT 0.5 10/16/2021 1149   GFRNONAA 57 (L) 12/21/2019 1027   GFRAA 66 12/21/2019 1027       Component Value Date/Time   WBC 4.0 10/16/2021 1149   WBC 2.2 (L) 02/26/2017 1126   RBC 5.14 10/16/2021 1149   RBC 4.41 02/26/2017 1126   HGB 14.8 10/16/2021 1149   HCT 44.3 10/16/2021 1149   PLT 203 10/16/2021 1149   MCV 86 10/16/2021 1149   MCH 28.8 10/16/2021 1149   MCH 28.3 02/26/2017 1126   MCHC 33.4 10/16/2021 1149   MCHC 32.7 02/26/2017 1126   RDW 12.2 10/16/2021 1149   LYMPHSABS 1.2 10/16/2021 1149   MONOABS 220 02/26/2017 1126   EOSABS 0.1 10/16/2021 1149  BASOSABS 0.0 10/16/2021 1149    No results found for: "POCLITH", "LITHIUM"   No results found for: "PHENYTOIN", "PHENOBARB", "VALPROATE", "CBMZ"   .res Assessment: Plan:   Will continue current plan of care since target signs and symptoms are well controlled without any tolerability issues. Pt denies  depressed mood and reports that it is typical for her to sleep more during January and she anticipates this changing in a few weeks.  Continue Effexor XR 75 mg po qd for anxiety and depression.  Continue Trileptal 150 mg po BID for mood stabilization.  She reports that insomnia has resolved and she is no longer needing Trazodone.  She also has not been using  Klonopin prn and Modafinil prn recently.  Pt to follow-up in 3 months or sooner if clinically indicated.  Patient advised to contact office with any questions, adverse effects, or acute worsening in signs and symptoms.   Sarah Hogan was seen today for follow-up.  Diagnoses and all orders for this visit:  Generalized anxiety disorder -     venlafaxine XR (EFFEXOR-XR) 75 MG 24 hr capsule; TAKE ONE CAPSULE BY MOUTH DAILY WITH BREAKFAST  Bipolar II disorder, moderate, depressed, with anxious distress (HCC) -     venlafaxine XR (EFFEXOR-XR) 75 MG 24 hr capsule; TAKE ONE CAPSULE BY MOUTH DAILY WITH BREAKFAST -     OXcarbazepine (TRILEPTAL) 150 MG tablet; Take 1 tablet (150 mg total) by mouth 2 (two) times daily.  Insomnia, unspecified type     Please see After Visit Summary for patient specific instructions.  Future Appointments  Date Time Provider [place]  09/06/2022 11:20 AM Sueanne Margarita, MD [facility]  10/22/2022 10:30 AM Early, Coralee Pesa, NP [facility]    No orders of the defined types were placed in this encounter.     -------------------------------

## 2022-09-06 ENCOUNTER — Ambulatory Visit (INDEPENDENT_AMBULATORY_CARE_PROVIDER_SITE_OTHER): Payer: No Typology Code available for payment source

## 2022-09-06 ENCOUNTER — Ambulatory Visit: Payer: No Typology Code available for payment source | Attending: Cardiology | Admitting: Cardiology

## 2022-09-06 ENCOUNTER — Encounter: Payer: Self-pay | Admitting: Cardiology

## 2022-09-06 VITALS — BP 104/60 | HR 63 | Ht 67.0 in | Wt 210.6 lb

## 2022-09-06 DIAGNOSIS — E785 Hyperlipidemia, unspecified: Secondary | ICD-10-CM | POA: Diagnosis not present

## 2022-09-06 DIAGNOSIS — I351 Nonrheumatic aortic (valve) insufficiency: Secondary | ICD-10-CM | POA: Diagnosis not present

## 2022-09-06 DIAGNOSIS — R002 Palpitations: Secondary | ICD-10-CM

## 2022-09-06 DIAGNOSIS — I493 Ventricular premature depolarization: Secondary | ICD-10-CM | POA: Diagnosis not present

## 2022-09-06 DIAGNOSIS — I471 Supraventricular tachycardia, unspecified: Secondary | ICD-10-CM | POA: Diagnosis not present

## 2022-09-06 NOTE — Addendum Note (Signed)
Addended by: Joni Reining on: 09/06/2022 12:06 PM   Modules accepted: Orders

## 2022-09-06 NOTE — Patient Instructions (Signed)
Medication Instructions:  Your physician recommends that you continue on your current medications as directed. Please refer to the Current Medication list given to you today.  *If you need a refill on your cardiac medications before your next appointment, please call your pharmacy*   Lab Work: Please complete a FASTING lipid panel and ALT in our lab before you leave today.  If you have labs (blood work) drawn today and your tests are completely normal, you will receive your results only by: [place] (if you have MyChart) OR A paper copy in the mail If you have any lab test that is abnormal or we need to change your treatment, we will call you to review the results.   Testing/Procedures: Your physician has ordered a Coronary Calcium Score CT for you. The patient cost is $94-99. This test can show early signs of atherosclerosis or blockage in the coronary arteries.   Your physician has requested that you have an echocardiogram. Echocardiography is a painless test that uses sound waves to create images of your heart. It provides your doctor with information about the size and shape of your heart and how well your heart's chambers and valves are working. This procedure takes approximately one hour. There are no restrictions for this procedure. Please do NOT wear cologne, perfume, aftershave, or lotions (deodorant is allowed). Please arrive 15 minutes prior to your appointment time.  ZIO XT- Long Term Monitor Instructions  Your physician has requested you wear a ZIO patch monitor for 14 days.  This is a single patch monitor. [employer] supplies one patch monitor per enrollment. Additional stickers are not available. Please do not apply patch if you will be having a Nuclear Stress Test,  Echocardiogram, Cardiac CT, MRI, or Chest Xray during the period you would be wearing the  monitor. The patch cannot be worn during these tests. You cannot remove and re-apply the  ZIO XT patch monitor.   Your ZIO patch monitor will be mailed 3 day USPS to your address on file. It may take 3-5 days  to receive your monitor after you have been enrolled.  Once you have received your monitor, please review the enclosed instructions. Your monitor  has already been registered assigning a specific monitor serial # to you.  Billing and Patient Assistance Program Information  We have supplied [employer] with any of your insurance information on file for billing purposes. [employer] offers a sliding scale Patient Assistance Program for patients that do not have  insurance, or whose insurance does not completely cover the cost of the ZIO monitor.  You must apply for the Patient Assistance Program to qualify for this discounted rate.  To apply, please call [employer] at [phone], select option 4, select option 2, ask to apply for  Patient Assistance Program. Theodore Demark will ask your household income, and how many people  are in your household. They will quote your out-of-pocket cost based on that information.  [employer] will also be able to set up a 70-month interest-free payment plan if needed.  Applying the monitor   Shave hair from upper left chest.  Hold abrader disc by orange tab. Rub abrader in 40 strokes over the upper left chest as  indicated in your monitor instructions.  Clean area with 4 enclosed alcohol pads. Let dry.  Apply patch as indicated in monitor instructions. Patch will be placed under collarbone on left  side of chest with arrow pointing upward.  Rub patch adhesive wings for 2 minutes. Remove white label  marked "1". Remove the white  label marked "2". Rub patch adhesive wings for 2 additional minutes.  While looking in a mirror, press and release button in center of patch. A small green light will  flash 3-4 times. This will be your only indicator that the monitor has been turned on.  Do not shower for the first 24 hours. You may shower after the first 24 hours.  Press the button if  you feel a symptom. You will hear a small click. Record Date, Time and  Symptom in the Patient Logbook.  When you are ready to remove the patch, follow instructions on the last 2 pages of Patient  Logbook. Stick patch monitor onto the last page of Patient Logbook.  Place Patient Logbook in the blue and white box. Use locking tab on box and tape box closed  securely. The blue and white box has prepaid postage on it. Please place it in the mailbox as  soon as possible. Your physician should have your test results approximately 7 days after the  monitor has been mailed back to [facility].  Call [place] at [phone] if you have questions regarding  your ZIO XT patch monitor. Call them immediately if you see an orange light blinking on your  monitor.  If your monitor falls off in less than 4 days, contact our Monitor department at [phone].  If your monitor becomes loose or falls off after 4 days call [employer] at [phone] for  suggestions on securing your monitor    Follow-Up:  Your next appointment:   1 year(s)  Provider:   Fransico Him, MD

## 2022-09-06 NOTE — Progress Notes (Unsigned)
Enrolled patient for a 14 day Zio XT  monitor to be mailed to patients home  °

## 2022-09-06 NOTE — Progress Notes (Signed)
Cardiology Office Note:    Date:  09/06/2022   ID:  [name], DOB [birth year], MRN [id number]  PCP:  Marcellina Millin (Inactive)  Cardiologist:  Fransico Him, MD    Referring MD: Irene Pap, PA-C   Chief Complaint  Patient presents with   Follow-up    SVT, PVCs, AI, hyperlipidemia    History of Present Illness:    Sarah Hogan is a 62 y.o. female with a hx of hyperlipidemia, MS, GERD, migraines, bipolar disorder, tobacco abuse with 40 pk year history of smoking and normal coronary arteries by cath for positive stress test done for fatigue in 2015.  She has a history SVT with PACs and PVCs on event monitor. Her echo 08/2016 showed normal LVF with ER 65-70% with mild AR and MR.  She is here today for followup and is doing well.  She tells me that she has been having palpitations recently.  These only occur when she goes to bed at night.  SHe does not wake up with these.  She notices them when she is laying in bed watching TV.  She describes it as a fluttering sensation that lasts < 1-2 minutes at a time.  It takes her breath away but does not make her dizzy or feel like she is going to pass out. She denies any recent chest pain or pressure, SOB, DOE, PND, orthopnea, LE edema, dizziness or syncope. She is compliant with her meds and is tolerating meds with no SE.     Past Medical History:  Diagnosis Date   Allergy    Anxiety    Bipolar 2 disorder ([place])    Cervical polyp 12/16/2018   Depression    Former smoker 12/16/2018   Hyperlipidemia    Leukopenia    Mild aortic insufficiency    by echo 07/2016-- Leaky AV    Mild mitral regurgitation    by echo 07/2016   MS (multiple sclerosis) ([facility])    Neuromuscular disorder ([facility])    MS    Normal coronary arteries cath 2015   Overactive bladder    PVC's (premature ventricular contractions) 04/22/2017   SVT (supraventricular tachycardia) 04/22/2017   Vitamin D deficiency     Past Surgical History:  Procedure Laterality Date    BREAST BIOPSY Left 2002   CARPAL TUNNEL RELEASE     CHOLECYSTECTOMY     COLONOSCOPY  2010   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     pigmented squamous cell carcinoma removal     POLYPECTOMY  2010   1 polyp- rectum , no path    UPPER GASTROINTESTINAL ENDOSCOPY  2010    Current Medications: Current Meds  Medication Sig   baclofen (LIORESAL) 10 MG tablet Take 10 mg by mouth 2 (two) times daily.   Biotin 5000 MCG CAPS Take by mouth.   cholecalciferol (VITAMIN D3) 25 MCG (1000 UNIT) tablet Take 1,000 Units by mouth daily.   Chromium Picolinate (CHROMIUM PICOLATE PO) Take by mouth.   clonazePAM (KLONOPIN) 0.5 MG tablet Take 1 tablet (0.5 mg total) by mouth daily as needed for anxiety.   ezetimibe (ZETIA) 10 MG tablet TAKE ONE TABLET BY MOUTH DAILY   Fiber 500 MG CAPS Take by mouth.   metoprolol succinate (TOPROL-XL) 25 MG 24 hr tablet TAKE ONE TABLET BY MOUTH DAILY   modafinil (PROVIGIL) 100 MG tablet Take 100 mg by mouth daily as needed (Driving).   MYRBETRIQ 25 MG TB24 tablet Take 1 tablet (25 mg total)  by mouth daily.   NONFORMULARY OR COMPOUNDED ITEM Compounded progesterone SR 164m.  1 capsule daily.   OXcarbazepine (TRILEPTAL) 150 MG tablet Take 1 tablet (150 mg total) by mouth 2 (two) times daily.   traZODone (DESYREL) 100 MG tablet Take 1/2-1 tablet po QHS prn insomnia   UNABLE TO FIND Med Name: NAC 600 mg BID   venlafaxine XR (EFFEXOR-XR) 75 MG 24 hr capsule TAKE ONE CAPSULE BY MOUTH DAILY WITH BREAKFAST   [DISCONTINUED] modafinil (PROVIGIL) 100 MG tablet Take 1 tablet (100 mg total) by mouth every morning.     Allergies:   Amoxicillin   Social History   Socioeconomic History   Marital status: Single    Spouse name: Not on file   Number of children: Not on file   Years of education: Not on file   Highest education level: Not on file  Occupational History   Not on file  Tobacco Use   Smoking status: Former    Packs/day: 1.50    Years: 20.00    Total pack  years: 30.00    Types: Cigarettes    Quit date: 10/28/1994    Years since quitting: 27.8   Smokeless tobacco: Never  Vaping Use   Vaping Use: Never used  Substance and Sexual Activity   Alcohol use: Yes    Alcohol/week: 0.0 - 1.0 standard drinks of alcohol    Comment: occ   Drug use: No   Sexual activity: Not Currently    Comment: since 2008  Other Topics Concern   Not on file  Social History Narrative   Not on file   Social Determinants of Health   Financial Resource Strain: Not on file  Food Insecurity: Not on file  Transportation Needs: Not on file  Physical Activity: Not on file  Stress: Not on file  Social Connections: Not on file     Family History: The patient's family history includes Bipolar disorder in her mother; Breast cancer in her paternal grandmother; Colon polyps in her brother; Dementia in her mother; Hyperlipidemia in her brother; Hypertension in her brother; Obesity in her brother; Skin cancer in her paternal grandmother; Stroke (age of onset: 634 in her mother. There is no history of Colon cancer, Esophageal cancer, Rectal cancer, or Stomach cancer.  ROS:   Please see the history of present illness.    ROS  All other systems reviewed and negative.   EKGs/Labs/Other Studies Reviewed:    The following studies were reviewed today: none  EKG:  EKG is  ordered today.  The ekg ordered today demonstrates NSR with low voltage EKG Recent Labs: 10/16/2021: ALT 11; BUN 11; Creatinine, Ser 1.02; Hemoglobin 14.8; Platelets 203; Potassium 4.2; Sodium 138   Recent Lipid Panel    Component Value Date/Time   CHOL 282 (H) 10/16/2021 1149   TRIG 125 10/16/2021 1149   HDL 65 10/16/2021 1149   CHOLHDL 4.3 10/16/2021 1149   CHOLHDL 4.2 02/26/2017 1126   VLDL 38 (H) 02/26/2017 1126   LDLCALC 195 (H) 10/16/2021 1149    Physical Exam:    VS:  BP 104/60   Pulse 63   Ht 5' 7"$  (1.702 m)   Wt 210 lb 9.6 oz (95.5 kg)   LMP 06/28/2008   SpO2 95%   BMI 32.98 kg/m      Wt Readings from Last 3 Encounters:  09/06/22 210 lb 9.6 oz (95.5 kg)  06/03/22 206 lb 9.6 oz (93.7 kg)  04/12/22 197 lb 3.2 oz (89.4  kg)    GEN: Well nourished, well developed in no acute distress HEENT: Normal NECK: No JVD; No carotid bruits LYMPHATICS: No lymphadenopathy CARDIAC:RRR, no murmurs, rubs, gallops RESPIRATORY:  Clear to auscultation without rales, wheezing or rhonchi  ABDOMEN: Soft, non-tender, non-distended MUSCULOSKELETAL:  No edema; No deformity  SKIN: Warm and dry NEUROLOGIC:  Alert and oriented x 3 PSYCHIATRIC:  Normal affect  ASSESSMENT:    1. SVT (supraventricular tachycardia)   2. PVC's (premature ventricular contractions)   3. Mild aortic insufficiency   4. Hyperlipidemia, unspecified hyperlipidemia type    PLAN:    In order of problems listed above:  1.  SVT -recently has been having palpitations only at night when she first goes to bed and is watching TV lasting about 1-2 minutes at a time -I will get a 2 week Ziopatch to assess for arrhythmias -Continue prescription drug management with Toprol-XL 25 mg daily with as needed refills  2.  PVCs -continue Toprol XL 25 mg daily  3.  Mitral and aortic regurgitation -echo 10/2019 showed no MR and mild AR -Repeat 2D echo to make sure AI has not progressed  4.  HLD -followed by PCP -I have personally reviewed and interpreted outside labs performed by patient's PCP which showed LDL 195 and HDL 65 on 10/16/2021>>she had not been taking her Zetia for a while prior to these labs -LDL goal < 130 -I am going to check her FLP and ALT -She is statin intolerant -Continue prescription drug therapy with Zetia 10 mg daily -I am going to check a coronary artery calcium score to determine if she has any coronary calcium.  She does not would refer to lipid clinic for PCSK9 inhibitor given her markedly elevated LDL and statin intolerance  Medication Adjustments/Labs and Tests Ordered: Current medicines are reviewed  at length with the patient today.  Concerns regarding medicines are outlined above.  Orders Placed This Encounter  Procedures   EKG 12-Lead   No orders of the defined types were placed in this encounter.   Signed, Fransico Him, MD  09/06/2022 11:53 AM    [place]

## 2022-09-07 LAB — LIPID PANEL
Chol/HDL Ratio: 4.3 ratio (ref 0.0–4.4)
Cholesterol, Total: 230 mg/dL — ABNORMAL HIGH (ref 100–199)
HDL: 53 mg/dL (ref >39)
LDL Chol Calc (NIH): 148 mg/dL — ABNORMAL HIGH (ref 0–99)
Triglycerides: 159 mg/dL — ABNORMAL HIGH (ref 0–149)
VLDL Cholesterol Cal: 29 mg/dL (ref 5–40)

## 2022-09-07 LAB — ALT: ALT: 11 IU/L (ref 0–32)

## 2022-09-10 DIAGNOSIS — N3281 Overactive bladder: Secondary | ICD-10-CM | POA: Diagnosis not present

## 2022-09-10 DIAGNOSIS — R350 Frequency of micturition: Secondary | ICD-10-CM | POA: Diagnosis not present

## 2022-09-10 DIAGNOSIS — N3941 Urge incontinence: Secondary | ICD-10-CM | POA: Diagnosis not present

## 2022-09-14 DIAGNOSIS — R002 Palpitations: Secondary | ICD-10-CM

## 2022-10-07 ENCOUNTER — Telehealth: Payer: Self-pay

## 2022-10-07 ENCOUNTER — Other Ambulatory Visit: Payer: Self-pay | Admitting: Medical

## 2022-10-07 MED ORDER — METOPROLOL SUCCINATE ER 25 MG PO TB24: 25.0000 mg | ORAL_TABLET | Freq: Every day | ORAL | 0 refills | Status: DC

## 2022-10-07 NOTE — Telephone Encounter (Signed)
Received fax from pharmacy for refill of metoprolol succ er 25mg  tabs. Last sent on 01/04/22 90 tabs with 2 refills.

## 2022-10-08 NOTE — Telephone Encounter (Signed)
Pt has been scheduled for fasting physical on 10/22/22

## 2022-10-09 ENCOUNTER — Encounter (HOSPITAL_BASED_OUTPATIENT_CLINIC_OR_DEPARTMENT_OTHER): Payer: Self-pay

## 2022-10-09 ENCOUNTER — Ambulatory Visit (HOSPITAL_BASED_OUTPATIENT_CLINIC_OR_DEPARTMENT_OTHER)
Admission: RE | Admit: 2022-10-09 | Discharge: 2022-10-09 | Disposition: A | Discharge status: Home / Self Care | Payer: No Typology Code available for payment source | Source: Ambulatory Visit | Attending: Cardiology | Admitting: Cardiology

## 2022-10-09 DIAGNOSIS — R002 Palpitations: Secondary | ICD-10-CM | POA: Diagnosis not present

## 2022-10-10 DIAGNOSIS — G35 Multiple sclerosis: Secondary | ICD-10-CM | POA: Diagnosis not present

## 2022-10-11 ENCOUNTER — Ambulatory Visit (HOSPITAL_COMMUNITY): Payer: No Typology Code available for payment source | Attending: Cardiology

## 2022-10-11 ENCOUNTER — Telehealth: Payer: Self-pay

## 2022-10-11 DIAGNOSIS — I351 Nonrheumatic aortic (valve) insufficiency: Secondary | ICD-10-CM | POA: Insufficient documentation

## 2022-10-11 LAB — ECHOCARDIOGRAM COMPLETE
Area-P 1/2: 3.33 cm2
P 1/2 time: 754 msec
S' Lateral: 3 cm

## 2022-10-11 NOTE — Telephone Encounter (Signed)
Reviewed Calcium score results with patient and forwarded  results to [facility], patient's PCP per Dr Theodosia Blender request.  Patient verbalized understanding and had no questions.

## 2022-10-14 ENCOUNTER — Telehealth: Payer: Self-pay

## 2022-10-14 ENCOUNTER — Encounter: Payer: Self-pay | Admitting: Cardiology

## 2022-10-14 DIAGNOSIS — E785 Hyperlipidemia, unspecified: Secondary | ICD-10-CM

## 2022-10-14 DIAGNOSIS — I471 Supraventricular tachycardia, unspecified: Secondary | ICD-10-CM

## 2022-10-14 MED ORDER — METOPROLOL SUCCINATE ER 25 MG PO TB24: 25.0000 mg | ORAL_TABLET | Freq: Two times a day (BID) | ORAL | 0 refills | Status: DC

## 2022-10-14 NOTE — Telephone Encounter (Signed)
Patient is aware of echo results. Verbalized understanding, no questions at this time.

## 2022-10-14 NOTE — Telephone Encounter (Signed)
-----   Message from Sueanne Margarita, MD sent at 10/14/2022  2:49 PM EDT ----- Echo showed normal heart function with increased stiffness of heart muscle called diastolic dysfunction. There is mild leakiness of the AV - repeat echo in 1 year for AI

## 2022-10-14 NOTE — Telephone Encounter (Signed)
Called patient to review results of lipid labs, Coronary calcium score, long term heart monitor as well as Dr. Theodosia Blender treatment plan.   Results of long-term heart monitor reviewed. Education provided on paroxysmal atrial tachycardia and symptoms patients can experience. Patient agrees to increase toprol to 25 mg BID and keep BP log twice daily for one week. Medication called into patient's pharmacy of choice. Patient set up with appointment with Nicholes Rough, PA for 10/28/22 per Dr. Theodosia Blender request. Lipid labs reviewed with patient, patient states she does not consistently take her zetia as she forgets to refill her medbox at times. Patient verbalizes that she will try and take medication every day and also agrees to lipid clinic referral.  Coronary CT score was forwarded to PCP so they would be aware of liver cysts. Patient states someone already called and reviewed these results with her.

## 2022-10-16 IMAGING — MG MM DIGITAL SCREENING BILAT W/ TOMO AND CAD
8 series · 8 of 24 positions shown · non-contrast
Comparison: Previous exam(s).

CLINICAL DATA: Screening.

EXAM:
DIGITAL SCREENING BILATERAL MAMMOGRAM WITH TOMOSYNTHESIS AND CAD
TECHNIQUE: Bilateral screening digital craniocaudal and mediolateral oblique
mammograms were obtained. Bilateral screening digital breast
tomosynthesis was performed. The images were evaluated with
computer-aided detection.

[L CC synth-2D]
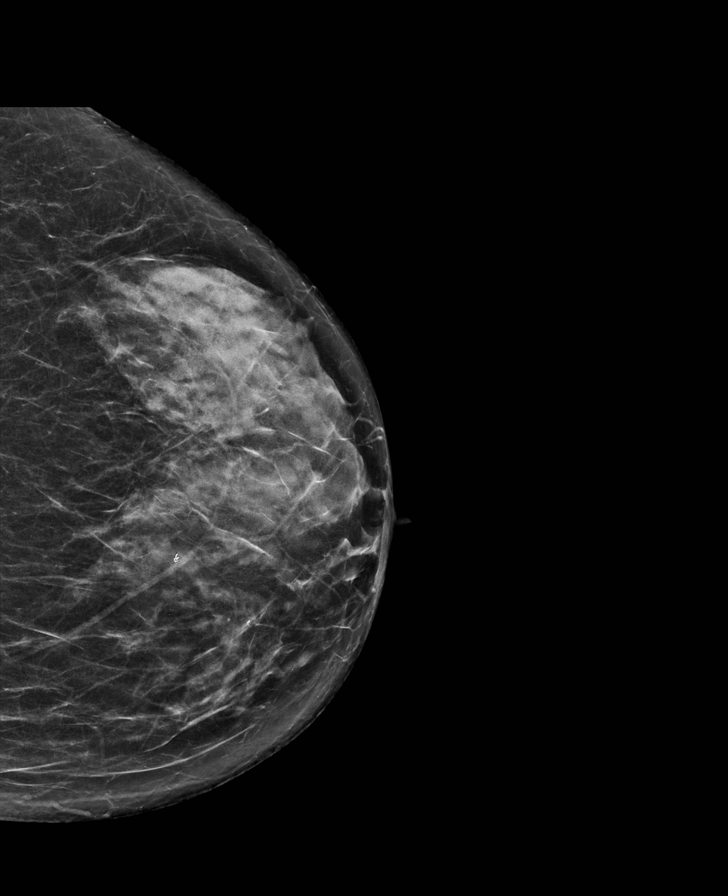

[L MLO synth-2D]
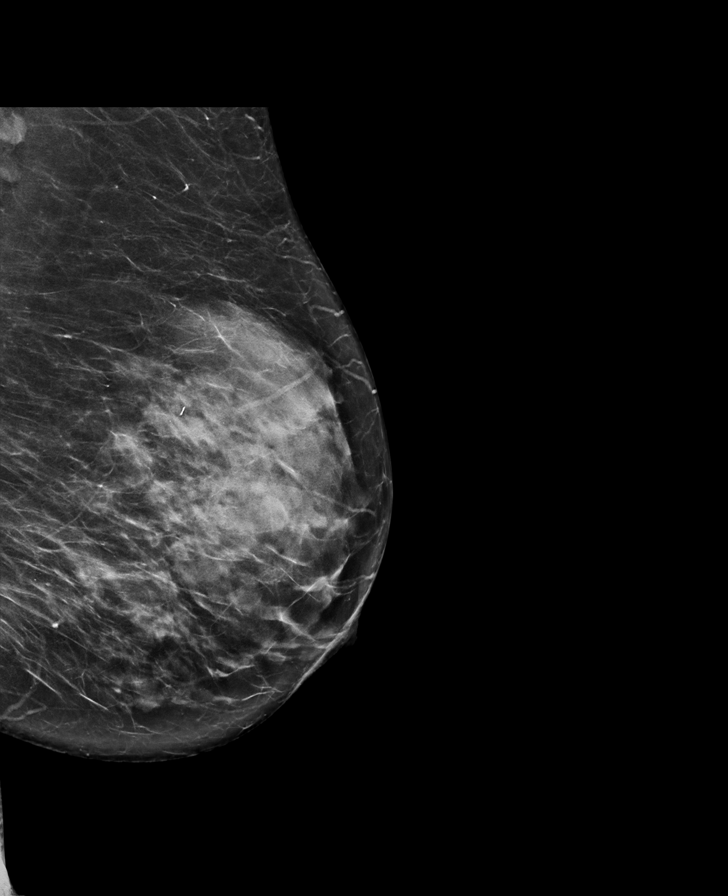

[R MLO synth-2D]
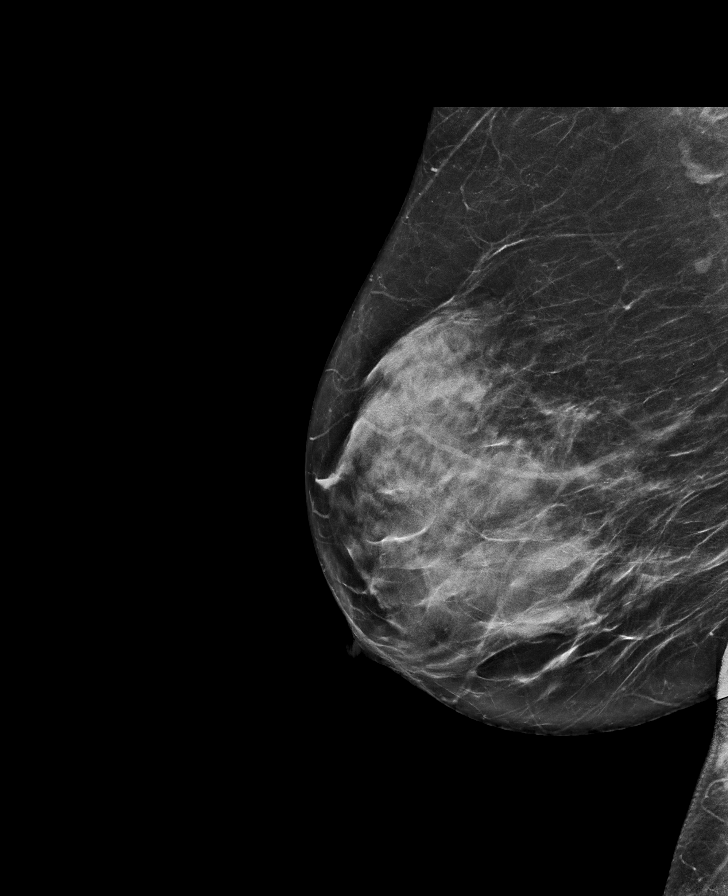

[R CC synth-2D]
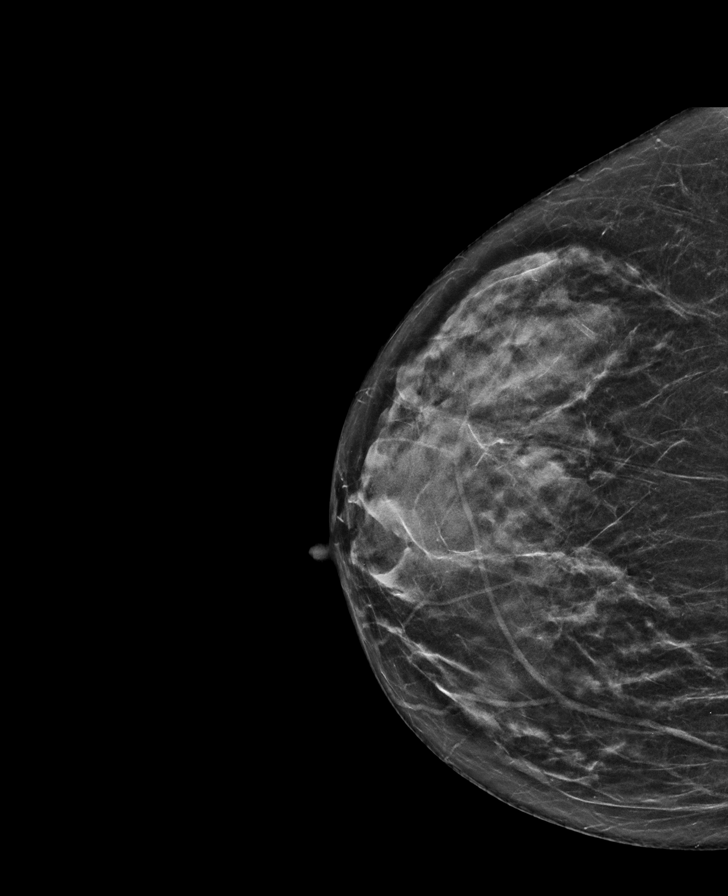

[L CC tomo · tomo slice 31/62.0]
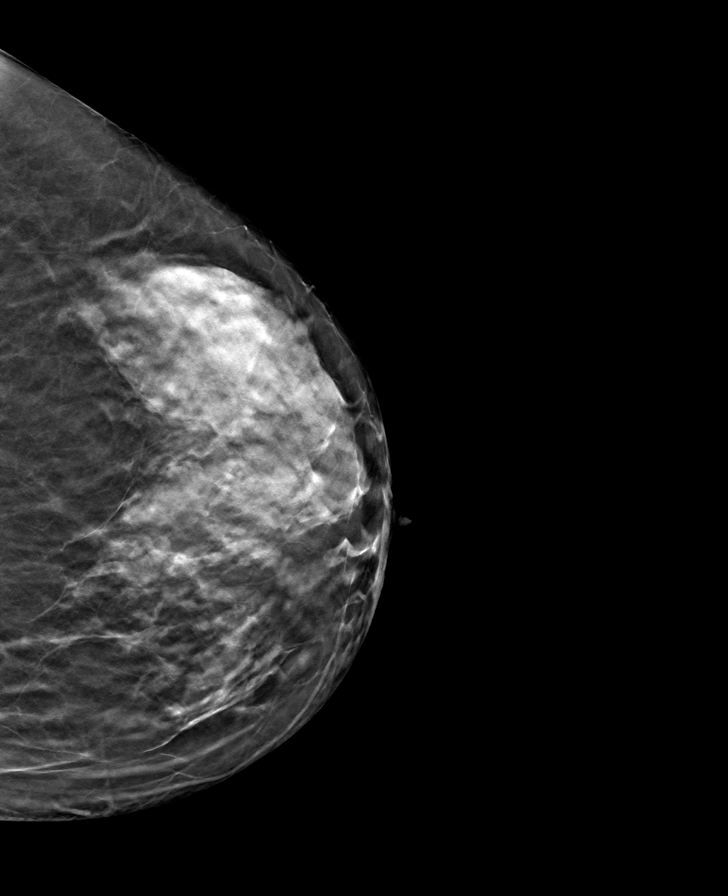

[R MLO tomo · tomo slice 35/68.0]
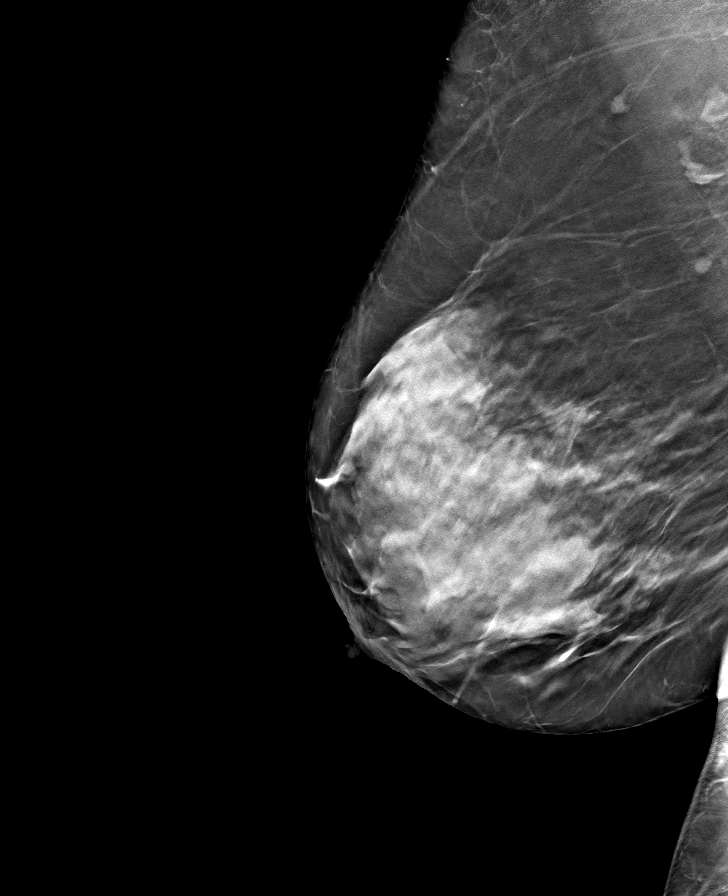

[R CC tomo · tomo slice 27/52.0]
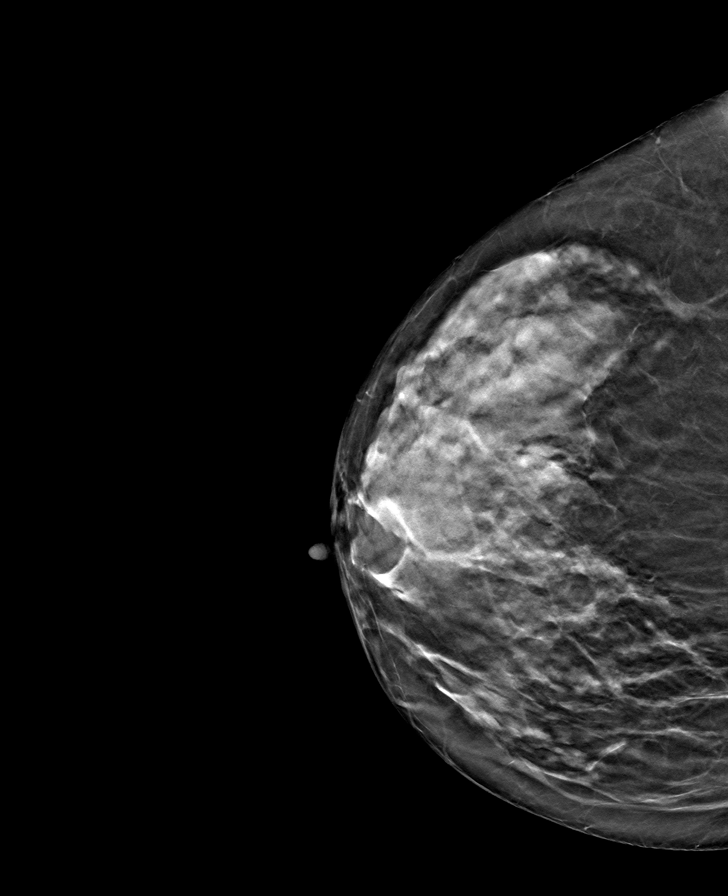

[L MLO tomo · tomo slice 37/73.0]
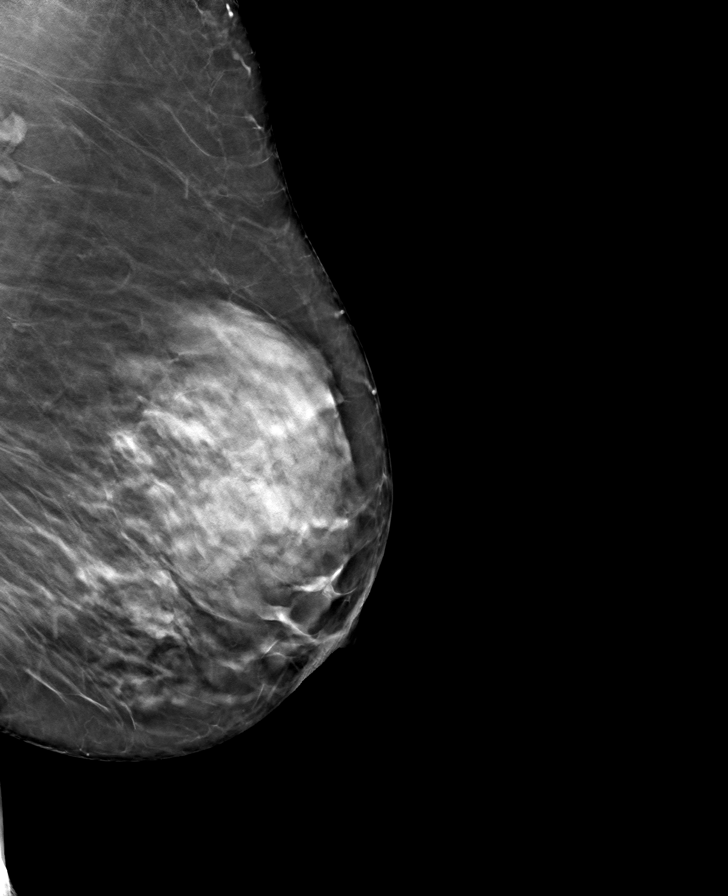

[8 of 24 positions shown; findings below may reference images not displayed]

ACR Breast Density Category c: The breast tissue is heterogeneously
dense, which may obscure small masses.
FINDINGS: There are no findings suspicious for malignancy.
IMPRESSION: No mammographic evidence of malignancy. A result letter of this
screening mammogram will be mailed directly to the patient.

RECOMMENDATION:
Screening mammogram in one year. (Code:Q3-W-BC3)

BI-RADS CATEGORY  1: Negative.

## 2022-10-21 ENCOUNTER — Ambulatory Visit: Payer: No Typology Code available for payment source | Admitting: Physician Assistant

## 2022-10-22 ENCOUNTER — Ambulatory Visit: Payer: No Typology Code available for payment source | Admitting: Nurse Practitioner

## 2022-10-22 ENCOUNTER — Encounter: Payer: Self-pay | Admitting: Nurse Practitioner

## 2022-10-22 VITALS — BP 94/60 | HR 60 | Ht 66.0 in | Wt 211.0 lb

## 2022-10-22 DIAGNOSIS — Z85828 Personal history of other malignant neoplasm of skin: Secondary | ICD-10-CM | POA: Insufficient documentation

## 2022-10-22 DIAGNOSIS — E782 Mixed hyperlipidemia: Secondary | ICD-10-CM | POA: Diagnosis not present

## 2022-10-22 DIAGNOSIS — E559 Vitamin D deficiency, unspecified: Secondary | ICD-10-CM | POA: Diagnosis not present

## 2022-10-22 DIAGNOSIS — F3181 Bipolar II disorder: Secondary | ICD-10-CM | POA: Diagnosis not present

## 2022-10-22 DIAGNOSIS — Z0000 Encounter for general adult medical examination without abnormal findings: Secondary | ICD-10-CM

## 2022-10-22 DIAGNOSIS — K7689 Other specified diseases of liver: Secondary | ICD-10-CM

## 2022-10-22 DIAGNOSIS — G35 Multiple sclerosis: Secondary | ICD-10-CM

## 2022-10-22 LAB — LIPID PANEL

## 2022-10-22 NOTE — Progress Notes (Unsigned)
[facility] [place] [facility] [address]  [place],   [address] [place] VISIT  10/23/2022  Subjective:  Sarah Hogan is a 62 y.o. female patient of Sarah Hogan, Sarah Pesa, NP who had a Medicare Annual Wellness Visit today. [name] is Disabled and lives alone. she has no children. she reports that she is socially active and does interact with friends/family regularly. she is moderately physically active and enjoys reading.  Patient Care Team: Ritisha Deitrick, Sarah Pesa, NP as PCP - General (Nurse Practitioner) Sueanne Margarita, MD as PCP - Cardiology (Cardiology) Megan Salon, MD as Consulting Physician (Obstetrics and Gynecology)     10/23/2022    7:32 AM 10/16/2021   11:03 AM  Advanced Directives  Does Patient Have a Medical Advance Directive? Yes Yes  Type of Paramedic of [place];Living will Living will  Does patient want to make changes to medical advance directive? No - Patient declined   Copy of [place] in Chart? No - copy requested     HPI [name] presents today with a medical history that includes multiple sclerosis (MS) diagnosed in 7. She is not on any MS-specific medication currently due to her age but manages leg pain and neuropathy in her right leg with medication. Additionally, [name] experiences urinary tract infections (UTIs) approximately every six months, which she attributes to incomplete bladder emptying secondary to her MS and overactive bladder. She is managing her overactive bladder with Myrbetriq, sourced from [place].  She has a cardiac history and is on heart medication. [name] reports consistently low blood pressure readings, typically around 90/60. During a cardiac calcium CT scan, cysts were discovered on her liver, but with normal liver enzyme results, she has been advised not to be concerned about these cysts.  [name] also has  a history of bipolar disorder and has been in remission from depression since December. She is not currently employed and lives alone with her pets, maintaining social activity through her church and community involvement. Despite struggling with motivation, which has been discussed with her psychiatrist, she has been advised to take Provigil daily.  In terms of her dietary health, [name] reports a history of sugar addiction, which she has been addressing for the past six weeks. She has previously lost 40 pounds with Weight Watchers but has regained the weight and is now above her initial weight. She expresses a strong desire to focus on weight loss as her primary health goal for the upcoming year. Hospital Utilization Over the Past 12 Months: # of hospitalizations or ER visits: 0 # of surgeries: 0  Review of Systems    Patient reports that her overall health is better when compared to last year.  Review of Systems: Negative except for what is listed in the HPI  All other systems negative.  Pain Assessment  Neuropathy pain in the leg     Current Medications & Allergies (verified) Allergies as of 10/22/2022       Reactions   Amoxicillin Rash        Medication List        Accurate as of October 22, 2022 11:59 PM. If you have any questions, ask your nurse or doctor.          baclofen 10 MG tablet Commonly known as: LIORESAL Take 10 mg by mouth 2 (two) times daily.   Biotin 5000 MCG Caps Take by mouth.  cholecalciferol 25 MCG (1000 UNIT) tablet Commonly known as: VITAMIN D3 Take 1,000 Units by mouth daily.   CHROMIUM PICOLATE PO Take by mouth.   clonazePAM 0.5 MG tablet Commonly known as: KLONOPIN Take 1 tablet (0.5 mg total) by mouth daily as needed for anxiety.   ezetimibe 10 MG tablet Commonly known as: ZETIA TAKE ONE TABLET BY MOUTH DAILY   Fiber 500 MG Caps Take by mouth.   metoprolol succinate 25 MG 24 hr tablet Commonly known as: TOPROL-XL Take 1 tablet  (25 mg total) by mouth in the morning and at bedtime.   modafinil 100 MG tablet Commonly known as: PROVIGIL Take 100 mg by mouth daily as needed (Driving).   Myrbetriq 25 MG Tb24 tablet Generic drug: mirabegron ER Take 1 tablet (25 mg total) by mouth daily.   NONFORMULARY OR COMPOUNDED ITEM Compounded progesterone SR 100mg .  1 capsule daily.   OXcarbazepine 150 MG tablet Commonly known as: TRILEPTAL Take 1 tablet (150 mg total) by mouth 2 (two) times daily.   pregabalin 50 MG capsule Commonly known as: LYRICA Take 50 mg by mouth 2 (two) times daily.   traZODone 100 MG tablet Commonly known as: DESYREL Take 1/2-1 tablet po QHS prn insomnia   UNABLE TO FIND Med Name: NAC 600 mg BID   venlafaxine XR 75 MG 24 hr capsule Commonly known as: EFFEXOR-XR TAKE ONE CAPSULE BY MOUTH DAILY WITH BREAKFAST        History (reviewed): Past Medical History:  Diagnosis Date   Allergy    Anxiety    Bipolar 2 disorder    Cervical polyp 12/16/2018   Depression    Former smoker 12/16/2018   Hyperlipidemia    Leukopenia    Mild aortic insufficiency    mild by echo 09/2022   Mild mitral regurgitation    by echo 07/2016   MS (multiple sclerosis)    Neuromuscular disorder    MS    Normal coronary arteries cath 2015   Overactive bladder    PVC's (premature ventricular contractions) 04/22/2017   SVT (supraventricular tachycardia) 04/22/2017   Vitamin D deficiency    Past Surgical History:  Procedure Laterality Date   BREAST BIOPSY Left 2002   CARPAL TUNNEL RELEASE     CHOLECYSTECTOMY     COLONOSCOPY  2010   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     pigmented squamous cell carcinoma removal     POLYPECTOMY  2010   1 polyp- rectum , no path    UPPER GASTROINTESTINAL ENDOSCOPY  2010   Family History  Problem Relation Age of Onset   Stroke Mother 42   Dementia Mother    Bipolar disorder Mother    Hypertension Brother    Hyperlipidemia Brother    Obesity Brother     Breast cancer Paternal Grandmother        over 35   Skin cancer Paternal Grandmother    Colon polyps Brother    Colon cancer Neg Hx    Esophageal cancer Neg Hx    Rectal cancer Neg Hx    Stomach cancer Neg Hx    Social History   Socioeconomic History   Marital status: Single    Spouse name: Not on file   Number of children: Not on file   Years of education: Not on file   Highest education level: Not on file  Occupational History   Not on file  Tobacco Use   Smoking status: Former    Packs/day: 1.50  Years: 20.00    Additional pack years: 0.00    Total pack years: 30.00    Types: Cigarettes    Quit date: 10/28/1994    Years since quitting: 28.0   Smokeless tobacco: Never  Vaping Use   Vaping Use: Never used  Substance and Sexual Activity   Alcohol use: Yes    Alcohol/week: 0.0 - 1.0 standard drinks of alcohol    Comment: occ   Drug use: No   Sexual activity: Not Currently    Comment: since 2008  Other Topics Concern   Not on file  Social History Narrative   Not on file   Social Determinants of Health   Financial Resource Strain: Low Risk  (10/23/2022)   Overall Financial Resource Strain (CARDIA)    Difficulty of Paying Living Expenses: Not hard at all  Food Insecurity: No Food Insecurity (10/23/2022)   Hunger Vital Sign    Worried About Running Out of Food in the Last Year: Never true    Ran Out of Food in the Last Year: Never true  Transportation Needs: No Transportation Needs (10/23/2022)   PRAPARE - Hydrologist (Medical): No    Lack of Transportation (Non-Medical): No  Physical Activity: Insufficiently Active (10/23/2022)   Exercise Vital Sign    Days of Exercise per Week: 2 days    Minutes of Exercise per Session: 10 min  Stress: No Stress Concern Present (10/23/2022)   [place]    Feeling of Stress : Not at all  Social Connections: Moderately Integrated (10/23/2022)    Social Connection and Isolation Panel [NHANES]    Frequency of Communication with Friends and Family: Three times a week    Frequency of Social Gatherings with Friends and Family: Not on file    Attends Religious Services: More than 4 times per year    Active Member of Clubs or Organizations: Yes    Attends Archivist Meetings: More than 4 times per year    Marital Status: Never married    Activities of Daily Living    10/23/2022    7:33 AM 10/22/2022   10:25 AM  In your present state of health, do you have any difficulty performing the following activities:  Hearing?  0  Vision?  0  Difficulty concentrating or making decisions?  1  Walking or climbing stairs?  0  Dressing or bathing?  0  Doing errands, shopping?  0  Preparing Food and eating ? N N  Using the Toilet? N N  In the past six months, have you accidently leaked urine? N Y  Do you have problems with loss of bowel control? N N  Comment  happens rarely  Managing your Medications? N N  Managing your Finances? Tempie Donning  Housekeeping or managing your Housekeeping? Tempie Donning  Comment focus- working with psychiatry     Patient Education/Literacy    Exercise Current Exercise Habits: The patient does not participate in regular exercise at present, Time (Minutes): 60, Frequency (Times/Week): 2, Weekly Exercise (Minutes/Week): 120, Intensity: Moderate, Exercise limited by: psychological condition(s)  Diet Patient reports consuming 2 meals a day and 0 snack(s) a day Patient reports that her primary diet is: Regular Patient reports that she does not have regular access to food.   Depression Screen    10/22/2022   10:35 AM 10/16/2021   10:57 AM 02/12/2021    4:09 PM 12/21/2020   11:11 AM 12/21/2019  9:27 AM 12/16/2018    9:57 AM 12/10/2017    1:45 PM  PHQ 2/9 Scores  PHQ - 2 Score 0 2 0 0 0 0 6  PHQ- 9 Score  6  4   19      Fall Risk    10/22/2022   10:34 AM 10/16/2021   10:57 AM 12/21/2020   11:11 AM 12/21/2019    9:27 AM  12/16/2018    9:56 AM  Fall Risk   Falls in the past year? 0 0 0 0 1  Number falls in past yr: 0 0 0 0 0  Injury with Fall? 0 0 0  1  Risk for fall due to : No Fall Risks  No Fall Risks    Follow up Falls evaluation completed  Falls evaluation completed       Objective:   BP 94/60   Pulse 60   Ht 5\' 6"  (1.676 m)   Wt 211 lb (95.7 kg)   LMP 06/28/2008   BMI 34.06 kg/m   Last Weight  Most recent update: 10/22/2022 10:50 AM    Weight  95.7 kg (211 lb)             Body mass index is 34.06 kg/m.  Hearing/Vision  Joud did not have difficulty with hearing/understanding during the face-to-face interview Krisa did not have difficulty with her vision during the face-to-face interview Reports that she has had a formal eye exam by an eye care professional within the past year Reports that she has not had a formal hearing evaluation within the past year  Cognitive Function:    10/23/2022    7:34 AM  6CIT Screen  What Year? 0 points  What month? 0 points  What time? 0 points  Count back from 20 0 points  Months in reverse 0 points  Repeat phrase 0 points  Total Score 0 points    Normal Cognitive Function Screening: Yes (Normal:0-7, Significant for Dysfunction: >8)  Immunization & Health Maintenance Record Immunization History  Administered Date(s) Administered   Influenza Split 04/04/2013, 07/22/2013   Influenza, Seasonal, Injecte, Preservative Fre 04/20/2009   Influenza,inj,Quad PF,6+ Mos 03/26/2018, 04/19/2019, 04/05/2022   Influenza,inj,quad, With Preservative 07/22/2014   Influenza-Unspecified 07/22/2013, 05/21/2017, 03/26/2018   Janssen (J&J) SARS-COV-2 Vaccination 10/09/2019, 02/03/2021   Tdap 07/22/2000, 01/12/2019   Zoster Recombinat (Shingrix) 02/02/2019, 04/05/2019    Health Maintenance  Topic Date Due   COVID-19 Vaccine (3 - 2023-24 season) 03/22/2022   INFLUENZA VACCINE  02/20/2023   Medicare Annual Wellness (AWV)  10/22/2023   MAMMOGRAM  12/19/2023    PAP SMEAR-Modifier  02/13/2024   DTaP/Tdap/Td (3 - Td or Tdap) 01/11/2029   COLONOSCOPY (Pts 45-23yrs Insurance coverage will need to be confirmed)  02/15/2030   Hepatitis C Screening  Completed   HIV Screening  Completed   Zoster Vaccines- Shingrix  Completed   HPV VACCINES  Aged Out       Assessment  This is a routine wellness examination for [employer].  Health Maintenance: Due or Overdue Health Maintenance Due  Topic Date Due   COVID-19 Vaccine (3 - 2023-24 season) 03/22/2022    [name] does not need a referral for Community Assistance: Care Management:   not applicable Social Work:    not applicable Prescription Assistance:  not applicable Nutrition/Diabetes Education:  not applicable   Plan:  Personalized Goals  Goals Addressed               This Visit's Progress  Weight (lb) < 200 lb (90.7 kg) (pt-stated)   211 lb (95.7 kg)     Jeilani plans to begin working on diet and exercise for weight loss with consideration for weight watchers, as this has been effective in the past.        Personalized Health Maintenance & Screening Recommendations  Advanced directives: bring in to have scanned into chart.   Lung Cancer Screening Recommended: not applicable (Low Dose CT Chest recommended if Age 39-80 years, 30 pack-year currently smoking OR have quit w/in past 15 years) Hepatitis C Screening recommended: not applicable HIV Screening recommended: not applicable  Advanced Directives: Written information was not given per the patient's request.  Referrals & Orders Orders Placed This Encounter  Procedures   CBC with Differential/Platelet   Comprehensive metabolic panel   Hemoglobin A1c   Lipid panel   VITAMIN D 25 Hydroxy (Vit-D Deficiency, Fractures)   TSH    Follow-up Plan Follow-up with Ebone Alcivar, Sarah Pesa, NP as planned    I have personally reviewed and noted the following in the patient's chart:   Medical and social history Use of alcohol, tobacco  or illicit drugs  Current medications and supplements Functional ability and status Nutritional status Physical activity Advanced directives List of other physicians Hospitalizations, surgeries, and ER visits in previous 12 months Vitals Screenings to include cognitive, depression, and falls Referrals and appointments  In addition, I have reviewed and discussed with patient certain preventive protocols, quality metrics, and best practice recommendations. A written personalized care plan for preventive services as well as general preventive health recommendations were provided to patient.     Orma Render, DNP, AGNP-c   10/23/2022

## 2022-10-22 NOTE — Patient Instructions (Addendum)
It was so nice to meet you today. Things look great today!!   Please let me know if you have any concerns.   Work on your [facility]. I have included some tips that may be helpful.  WEIGHT LOSS PLANNING  For best management of weight, it is vital to balance intake versus output. This means the number of calories burned per day must be less than the calories you take in with food and drink.   I recommend trying to follow a diet with the following: Calories: 1200-1500 calories per day Carbohydrates: 150-180 grams of carbohydrates per day  Why: Gives your body enough "quick fuel" for cells to maintain normal function without sending them into starvation mode.  Protein: At least 90 grams of protein per day- 30 grams with each meal Why: Protein takes longer and uses more energy than carbohydrates to break down for fuel. The carbohydrates in your meals serves as quick energy sources and proteins help use some of that extra quick energy to break down to produce long term energy. This helps you not feel hungry as quickly and protein breakdown burns calories.  Water: Drink AT LEAST 64 ounces of water per day  Why: Water is essential to healthy metabolism. Water helps to fill the stomach and keep you fuller longer. Water is required for healthy digestion and filtering of waste in the body.  Fat: Limit fats in your diet- when choosing fats, choose foods with lower fats content such as lean meats (chicken, fish, [place]).  Why: Increased fat intake leads to storage "for later". Once you burn your carbohydrate energy, your body goes into fat and protein breakdown mode to help you loose weight.  Cholesterol: Fats and oils that are LIQUID at room temperature are best. Choose vegetable oils (olive oil, avocado oil, nuts). Avoid fats that are SOLID at room temperature (animal fats, processed meats). Healthy fats are often found in whole grains, beans, nuts, seeds, and berries.  Why: Elevated cholesterol  levels lead to build up of cholesterol on the inside of your blood vessels. This will eventually cause the blood vessels to become hard and can lead to high blood pressure and damage to your organs. When the blood flow is reduced, but the pressure is high from cholesterol buildup, parts of the cholesterol can break off and form clots that can go to the brain or heart leading to a stroke or heart attack.  Fiber: Increase amount of SOLUBLE the fiber in your diet. This helps to fill you up, lowers cholesterol, and helps with digestion. Some foods high in soluble fiber are oats, peas, beans, apples, carrots, barley, and citrus fruits.   Why: Fiber fills you up, helps remove excess cholesterol, and aids in healthy digestion which are all very important in weight management.   I recommend the following as a minimum activity routine: Purposeful walk or other physical activity at least 20 minutes every single day. This means purposefully taking a walk, jog, bike, swim, treadmill, elliptical, dance, etc.  This activity should be ABOVE your normal daily activities, such as walking at work. Goal exercise should be at least 150 minutes a week- work your way up to this.   Heart Rate: Your maximum exercise heart rate should be 220 - Your Age in Years. When exercising, get your heart rate up, but avoid going over the maximum targeted heart rate.  60-70% of your maximum heart rate is where you tend to burn the most fat. To find this number:  220 - Age In Years= Max HR  Max HR x 0.6 (or 0.7) = Fat Burning HR The Fat Burning HR is your goal heart rate while working out to burn the most fat.  NEVER exercise to the point your feel lightheaded, weak, nauseated, dizzy. If you experience ANY of these symptoms- STOP exercise! Allow yourself to cool down and your heart rate to come down. Then restart slower next time.  If at [place] you feel chest pain or chest pressure during exercise, STOP IMMEDIATELY and seek medical  attention.

## 2022-10-23 LAB — CBC WITH DIFFERENTIAL/PLATELET
Basophils Absolute: 0 10*3/uL (ref 0.0–0.2)
Basos: 1 %
EOS (ABSOLUTE): 0.1 10*3/uL (ref 0.0–0.4)
Eos: 2 %
Hematocrit: 42.1 % (ref 34.0–46.6)
Hemoglobin: 14.1 g/dL (ref 11.1–15.9)
Immature Grans (Abs): 0 10*3/uL (ref 0.0–0.1)
Immature Granulocytes: 0 %
Lymphocytes Absolute: 1.3 10*3/uL (ref 0.7–3.1)
Lymphs: 32 %
MCH: 28.2 pg (ref 26.6–33.0)
MCHC: 33.5 g/dL (ref 31.5–35.7)
MCV: 84 fL (ref 79–97)
Monocytes Absolute: 0.3 10*3/uL (ref 0.1–0.9)
Monocytes: 7 %
Neutrophils Absolute: 2.5 10*3/uL (ref 1.4–7.0)
Neutrophils: 58 %
Platelets: 220 10*3/uL (ref 150–450)
RBC: 5 x10E6/uL (ref 3.77–5.28)
RDW: 12.7 % (ref 11.7–15.4)
WBC: 4.3 10*3/uL (ref 3.4–10.8)

## 2022-10-23 LAB — COMPREHENSIVE METABOLIC PANEL
ALT: 13 IU/L (ref 0–32)
AST: 18 IU/L (ref 0–40)
Albumin/Globulin Ratio: 1.9 (ref 1.2–2.2)
Albumin: 4.4 g/dL (ref 3.9–4.9)
Alkaline Phosphatase: 79 IU/L (ref 44–121)
BUN/Creatinine Ratio: 15 (ref 12–28)
BUN: 15 mg/dL (ref 8–27)
Bilirubin Total: 0.4 mg/dL (ref 0.0–1.2)
CO2: 22 mmol/L (ref 20–29)
Calcium: 9.8 mg/dL (ref 8.7–10.3)
Chloride: 104 mmol/L (ref 96–106)
Creatinine, Ser: 1.01 mg/dL — ABNORMAL HIGH (ref 0.57–1.00)
Globulin, Total: 2.3 g/dL (ref 1.5–4.5)
Glucose: 89 mg/dL (ref 70–99)
Potassium: 4.5 mmol/L (ref 3.5–5.2)
Sodium: 141 mmol/L (ref 134–144)
Total Protein: 6.7 g/dL (ref 6.0–8.5)
eGFR: 63 mL/min/{1.73_m2} (ref >59)

## 2022-10-23 LAB — LIPID PANEL
Chol/HDL Ratio: 4.6 ratio — ABNORMAL HIGH (ref 0.0–4.4)
Cholesterol, Total: 247 mg/dL — ABNORMAL HIGH (ref 100–199)
HDL: 54 mg/dL (ref >39)
LDL Chol Calc (NIH): 162 mg/dL — ABNORMAL HIGH (ref 0–99)
Triglycerides: 169 mg/dL — ABNORMAL HIGH (ref 0–149)
VLDL Cholesterol Cal: 31 mg/dL (ref 5–40)

## 2022-10-23 LAB — HEMOGLOBIN A1C
Est. average glucose Bld gHb Est-mCnc: 108 mg/dL
Hgb A1c MFr Bld: 5.4 % (ref 4.8–5.6)

## 2022-10-23 LAB — TSH: TSH: 1.63 u[IU]/mL (ref 0.450–4.500)

## 2022-10-23 LAB — VITAMIN D 25 HYDROXY (VIT D DEFICIENCY, FRACTURES): Vit D, 25-Hydroxy: 28.1 ng/mL — ABNORMAL LOW (ref 30.0–100.0)

## 2022-10-28 ENCOUNTER — Ambulatory Visit: Payer: No Typology Code available for payment source | Admitting: Physician Assistant

## 2022-11-04 ENCOUNTER — Ambulatory Visit (INDEPENDENT_AMBULATORY_CARE_PROVIDER_SITE_OTHER): Payer: No Typology Code available for payment source | Admitting: Psychiatry

## 2022-11-04 ENCOUNTER — Encounter: Payer: Self-pay | Admitting: Psychiatry

## 2022-11-04 DIAGNOSIS — F411 Generalized anxiety disorder: Secondary | ICD-10-CM

## 2022-11-04 DIAGNOSIS — G4700 Insomnia, unspecified: Secondary | ICD-10-CM | POA: Diagnosis not present

## 2022-11-04 DIAGNOSIS — F3181 Bipolar II disorder: Secondary | ICD-10-CM | POA: Diagnosis not present

## 2022-11-04 MED ORDER — OXCARBAZEPINE 150 MG PO TABS
150.0000 mg | ORAL_TABLET | Freq: Two times a day (BID) | ORAL | 1 refills | Status: DC
Start: 2022-11-04 — End: 2023-05-06

## 2022-11-04 MED ORDER — VENLAFAXINE HCL ER 75 MG PO CP24
ORAL_CAPSULE | ORAL | 0 refills | Status: DC
Start: 2022-11-04 — End: 2023-02-05

## 2022-11-04 NOTE — Progress Notes (Signed)
Sarah Hogan [id number] 20-Nov-1960 62 y.o.  Subjective:   Patient ID:  Sarah Hogan is a 62 y.o. (DOB March 03, 1961) female.  Chief Complaint:  Chief Complaint  Patient presents with   Follow-up    Bipolar Disorder and anxiety    HPI Sarah Hogan presents to the office today for follow-up of Bipolar disorder and anxiety. She reports, "I'm doing ok." Mood has improved- "I'm still not functioning. I'm not moving around and getting stuff done." Denies any recent manic symptoms.   She reports hyper-focusing on reading. She reports that she notices some sleep disturbance when she takes Modafinil, and therefore prefers not to take it regularly. She reports that she will take Modafinil while driving or doing paperwork. She reports that she has not been paying her bills consistently and is not on top of managing her finances. She reports that this causes her some anxiety, "but I'm not anxious enough to do something about it. She reports that her anxiety is manageable "because I don't listen to any news." She reports that she is not able to "absorb" current events or politics. She reports that she is no longer watching TV. She reports that she plays [place] radio throughout the day instead of watching TV. Sleep is "ok" most nights. Reports falling asleep without difficulty and having trouble staying asleep. Reports being awake several hours last night around 3 am. She has been socializing some and enjoying this. She reports that this is not typical for her. She reports that she finds "that my mind wants it, but my body doesn't" in terms of sugar intake. She reports minimal sugar intake. She reports that she has been craving oatmeal. She reports mostly eating oatmeal, frozen dinners, or eating out. Denies SI.   She enjoys her dog and cat.   She reports that overall TD has been better. She notices some TD when her anxiety is higher.   [name] been helpful for her depression. Worsening TD on 20 mg.   Latuda- Effective. Took samples.  Seroquel- Took briefly. Had severe fatigue Effexor- Taken for several years Zoloft- Was effective and then stopped working Prozac Remeron- Lost 80 lbs when she stopped taking it. Buspar Trileptal- Helps stabilize mood. Has taken long-term. Lamictal-Worsening depression Tranxene Provigil Trazodone  AIMS    Flowsheet Row Office Visit from 11/04/2022 in [facility] Office Visit from 06/05/2022 in [facility] Office Visit from 04/03/2022 in [facility] Office Visit from 09/04/2021 in [facility] Office Visit from 06/05/2021 in [facility]  AIMS Total Score PHQ2-9    Flowsheet Row Clinical Support from 10/22/2022 in [facility] Clinical Support from 10/16/2021 in [facility] Office Visit from 02/12/2021 in [facility] Clinical Support from 12/21/2020 in [facility] Clinical Support from 12/21/2019 in [facility]  PHQ-2 Total Score 0 2 0 0 0  PHQ-9 Total Score -- 6 -- 4 --        Review of Systems:  Review of Systems  Cardiovascular:  Positive for palpitations.  Musculoskeletal:  Negative for gait problem.  Neurological:  Positive for weakness.       Right leg is weak and painful. Dx'd with MS 34 years ago.  Psychiatric/Behavioral:         Please refer to HPI     She has apt to  go to a lipid clinic since she has not been able to tolerate statins.    Medications: I have reviewed the patient's current medications.  Current Outpatient Medications  Medication Sig Dispense Refill   baclofen (LIORESAL) 10 MG tablet Take 10 mg by mouth 2 (two) times daily.     Biotin 5000 MCG CAPS Take by mouth.     cholecalciferol (VITAMIN D3) 25 MCG (1000 UNIT) tablet Take 1,000 Units by mouth daily.      Chromium Picolinate (CHROMIUM PICOLATE PO) Take by mouth.     ezetimibe (ZETIA) 10 MG tablet TAKE ONE TABLET BY MOUTH DAILY 90 tablet 1   Fiber 500 MG CAPS Take by mouth.     metoprolol succinate (TOPROL-XL) 25 MG 24 hr tablet Take 1 tablet (25 mg total) by mouth in the morning and at bedtime. 90 tablet 0   MYRBETRIQ 25 MG TB24 tablet Take 1 tablet (25 mg total) by mouth daily. 90 tablet 1   NONFORMULARY OR COMPOUNDED ITEM Compounded progesterone SR .  1 capsule daily. 90 each 1   pregabalin (LYRICA) 50 MG capsule Take 50 mg by mouth 2 (two) times daily.     UNABLE TO FIND Med Name: NAC 600 mg BID     clonazePAM (KLONOPIN) 0.5 MG tablet Take 1 tablet (0.5 mg total) by mouth daily as needed for anxiety. (Patient not taking: Reported on 10/22/2022) 30 tablet 0   modafinil (PROVIGIL) 100 MG tablet Take 100 mg by mouth daily as needed (Driving). (Patient not taking: Reported on 10/22/2022)     OXcarbazepine (TRILEPTAL) 150 MG tablet Take 1 tablet (150 mg total) by mouth 2 (two) times daily. 180 tablet 1   traZODone (DESYREL) 100 MG tablet Take 1/2-1 tablet po QHS prn insomnia (Patient not taking: Reported on 10/22/2022) 30 tablet 2   venlafaxine XR (EFFEXOR-XR) 75 MG 24 hr capsule TAKE ONE CAPSULE BY MOUTH DAILY WITH BREAKFAST 90 capsule 0   No current facility-administered medications for this visit.    Medication Side Effects: None  Allergies:  Allergies  Allergen Reactions   Amoxicillin Rash    Past Medical History:  Diagnosis Date   Allergy    Anxiety    Bipolar 2 disorder    Cervical polyp 12/16/2018   Depression    Former smoker 12/16/2018   Hyperlipidemia    Leukopenia    Mild aortic insufficiency    mild by echo 09/2022   Mild mitral regurgitation    by echo 07/2016   MS (multiple sclerosis)    Neuromuscular disorder    MS    Normal coronary arteries cath 2015   Overactive bladder    PVC's (premature ventricular contractions) 04/22/2017   SVT (supraventricular  tachycardia) 04/22/2017   Vitamin D deficiency     Past Medical History, Surgical history, Social history, and Family history were reviewed and updated as appropriate.   Please see review of systems for further details on the patient's review from today.   Objective:   Physical Exam:  LMP 06/28/2008   Physical Exam Constitutional:      General: She is not in acute distress. Musculoskeletal:        General: No deformity.  Neurological:     Mental Status: She is alert and oriented to person, place, and time.     Coordination: Coordination normal.  Psychiatric:        Attention and Perception: Attention and perception normal. She does not perceive auditory or visual hallucinations.  Mood and Affect: Affect is not labile, blunt, angry or inappropriate.        Speech: Speech normal.        Behavior: Behavior normal.        Thought Content: Thought content normal. Thought content is not paranoid or delusional. Thought content does not include homicidal or suicidal ideation. Thought content does not include homicidal or suicidal plan.        Cognition and Memory: Cognition and memory normal.        Judgment: Judgment normal.     Comments: Insight intact Mood is mildly anxious Mood is dysthymic     Lab Review:     Component Value Date/Time   NA 141 10/22/2022 1146   K 4.5 10/22/2022 1146   CL 104 10/22/2022 1146   CO2 22 10/22/2022 1146   GLUCOSE 89 10/22/2022 1146   GLUCOSE 87 02/26/2017 1126   BUN 15 10/22/2022 1146   CREATININE 1.01 (H) 10/22/2022 1146   CREATININE 1.13 (H) 02/26/2017 1126   CALCIUM 9.8 10/22/2022 1146   PROT 6.7 10/22/2022 1146   ALBUMIN 4.4 10/22/2022 1146   AST 18 10/22/2022 1146   ALT 13 10/22/2022 1146   ALKPHOS 79 10/22/2022 1146   BILITOT 0.4 10/22/2022 1146   GFRNONAA 57 (L) 12/21/2019 1027   GFRAA 66 12/21/2019 1027       Component Value Date/Time   WBC 4.3 10/22/2022 1146   WBC 2.2 (L) 02/26/2017 1126   RBC 5.00 10/22/2022 1146    RBC 4.41 02/26/2017 1126   HGB 14.1 10/22/2022 1146   HCT 42.1 10/22/2022 1146   PLT 220 10/22/2022 1146   MCV 84 10/22/2022 1146   MCH 28.2 10/22/2022 1146   MCH 28.3 02/26/2017 1126   MCHC 33.5 10/22/2022 1146   MCHC 32.7 02/26/2017 1126   RDW 12.7 10/22/2022 1146   LYMPHSABS 1.3 10/22/2022 1146   MONOABS 220 02/26/2017 1126   EOSABS 0.1 10/22/2022 1146   BASOSABS 0.0 10/22/2022 1146    No results found for: "POCLITH", "LITHIUM"   No results found for: "PHENYTOIN", "PHENOBARB", "VALPROATE", "CBMZ"   .res Assessment: Plan:     Pt seen for 25 minutes and time spent discussing treatment plan with patient. She reports that she would like to continue current medications without changes. Discussed setting boundaries during upcoming family trip to manage anxiety and mood symptoms.  Continue Effexor XR 75 mg po qd for mood and anxiety.  Continue Trileptal 150 mg po BID for mood stabilization and anxiety.  Pt to follow-up in 3 months or sooner if clinically indicated.  Patient advised to contact office with any questions, adverse effects, or acute worsening in signs and symptoms.   Sarah Hogan was seen today for follow-up.  Diagnoses and all orders for this visit:  Bipolar II disorder, moderate, depressed, with anxious distress -     venlafaxine XR (EFFEXOR-XR) 75 MG 24 hr capsule; TAKE ONE CAPSULE BY MOUTH DAILY WITH BREAKFAST -     OXcarbazepine (TRILEPTAL) 150 MG tablet; Take 1 tablet (150 mg total) by mouth 2 (two) times daily.  Generalized anxiety disorder -     venlafaxine XR (EFFEXOR-XR) 75 MG 24 hr capsule; TAKE ONE CAPSULE BY MOUTH DAILY WITH BREAKFAST  Insomnia, unspecified type     Please see After Visit Summary for patient specific instructions.  Future Appointments  Date Time Provider Department Center  11/05/2022  8:30 AM [facility] PHARMACIST [facility]  02/03/2023 11:30 AM Corie Chiquito, PMHNP [facility] None  10/28/2023 11:15 AM Early, Sung Amabile, NP  [facility]    No orders of the defined types were placed in this encounter.   -------------------------------

## 2022-11-05 ENCOUNTER — Ambulatory Visit: Payer: No Typology Code available for payment source | Attending: Interventional Cardiology | Admitting: Pharmacist

## 2022-11-05 DIAGNOSIS — Z789 Other specified health status: Secondary | ICD-10-CM | POA: Diagnosis not present

## 2022-11-05 DIAGNOSIS — E782 Mixed hyperlipidemia: Secondary | ICD-10-CM

## 2022-11-05 MED ORDER — ROSUVASTATIN CALCIUM 5 MG PO TABS: ORAL_TABLET | ORAL | 3 refills | Status: DC

## 2022-11-05 NOTE — Progress Notes (Signed)
Patient ID: Sarah Hogan                 DOB: 1961-03-08                    MRN: [id number]      HPI: Sarah Hogan is a 62 y.o. female patient referred to lipid clinic by Dr. Mayford Knife. PMH is significant for hyperlipidemia, MS, GERD, migraines, bipolar disorder, tobacco abuse with 40 pk year history of smoking (quit in 1996), paroxysmal atrial tachycardia and CAC of 0.   Patient is on ezetimbie  daily but per chart reports not taking consistenly. Her LDL-C in March 2023 was 195. Last LDL-C in April 2024 was 162 with elevated TG of 169. Her 10 year ASCVD risk score using the PREVENT calculator is 4.1%, But she has a baseline >190 therefore guidelines would recommend treating to <100 regardless.  Patient presents today to clinic.  She reports that she does not want to take any medication that would cause her more leg pain.  She has MS and is on disability.  She is still functional but her 1 leg is getting weaker.  She already has leg pain.  Reports taking Crestor and Lipitor a long time ago.  Reports significant leg pain with both that was rapid onset.  She does do water aerobics 3 days a week.  She used to walk more but her dog passed away and her new dog like to walk.  We did discuss walking with a friend, which she did text while in the appointment who agreed to walk with her in the afternoon.  Patient does not cook and eats out often.  At home she eats a lot of instant oatmeal.  Patient is on disability and lives office of security.  The cost of medications, especially if brand-name can be very challenging.  She gets her Myrbetriq from [place].  She is on several different medications and hitting the coverage gap can be a challenge.    Reviewed with patient the 5 major modifiable risk factors for ASCVD including cholesterol, blood pressure, avoidance of tobacco and limited alcohol use, diet and exercise.  Patient quit smoking a long time ago.  She does not drink alcohol in excess maybe 1 drink per  week.  She does do some exercise but this could be increased.  Improvements in patient's diet that could be made were discussed.  Patient has low blood pressure at baseline.  We discussed nonstatin alternatives.  However the cost of these medications even with a grant would not be affordable as it would push her into the coverage gap and affect cost of her other medications.  Discussed using once or twice a week.  Current Medications: zetia  daily Intolerances: rosuvastatin and atorvastatin Risk Factors: LDL-C >190 LDL-C goal: <100 ApoB goal: <80  Diet:  doesn't cook, eats out a lot Breakfast: oatmeal- quick oats w/ PB, almond milk,  Lunch: tacos, flat bread, grains bowl Dinner: Drink: water, rare sweet tea  Exercise: water aerobics 3 times a week, not walking as much   Family History: The patient's family history includes Bipolar disorder in her mother; Breast cancer in her paternal grandmother; Colon polyps in her brother; Dementia in her mother; Hyperlipidemia in her brother; Hypertension in her brother; Obesity in her brother; Skin cancer in her paternal grandmother; Stroke (age of onset: 81) in her mother. There is no history of Colon cancer, Esophageal cancer, Rectal cancer, or Stomach cancer.   Social  History: no tobacco (quit in 1996), Drinks 1 oz vokda per week (or every other week)  Labs: Lipid Panel     Component Value Date/Time   CHOL 247 (H) 10/22/2022 1146   TRIG 169 (H) 10/22/2022 1146   HDL 54 10/22/2022 1146   CHOLHDL 4.6 (H) 10/22/2022 1146   CHOLHDL 4.2 02/26/2017 1126   VLDL 38 (H) 02/26/2017 1126   LDLCALC 162 (H) 10/22/2022 1146   LABVLDL 31 10/22/2022 1146    Past Medical History:  Diagnosis Date   Allergy    Anxiety    Bipolar 2 disorder    Cervical polyp 12/16/2018   Depression    Former smoker 12/16/2018   Hyperlipidemia    Leukopenia    Mild aortic insufficiency    mild by echo 09/2022   Mild mitral regurgitation    by echo 07/2016   MS  (multiple sclerosis)    Neuromuscular disorder    MS    Normal coronary arteries cath 2015   Overactive bladder    PVC's (premature ventricular contractions) 04/22/2017   SVT (supraventricular tachycardia) 04/22/2017   Vitamin D deficiency     Current Outpatient Medications on File Prior to Visit  Medication Sig Dispense Refill   metoprolol succinate (TOPROL-XL) 25 MG 24 hr tablet Take 1 tablet (25 mg total) by mouth in the morning and at bedtime. 90 tablet 0   baclofen (LIORESAL) 10 MG tablet Take 10 mg by mouth 2 (two) times daily.     Biotin 5000 MCG CAPS Take by mouth.     cholecalciferol (VITAMIN D3) 25 MCG (1000 UNIT) tablet Take 1,000 Units by mouth daily.     Chromium Picolinate (CHROMIUM PICOLATE PO) Take by mouth.     clonazePAM (KLONOPIN) 0.5 MG tablet Take 1 tablet (0.5 mg total) by mouth daily as needed for anxiety. (Patient not taking: Reported on 10/22/2022) 30 tablet 0   ezetimibe (ZETIA) 10 MG tablet TAKE ONE TABLET BY MOUTH DAILY 90 tablet 1   Fiber 500 MG CAPS Take by mouth.     modafinil (PROVIGIL) 100 MG tablet Take 100 mg by mouth daily as needed (Driving). (Patient not taking: Reported on 10/22/2022)     MYRBETRIQ 25 MG TB24 tablet Take 1 tablet (25 mg total) by mouth daily. 90 tablet 1   NONFORMULARY OR COMPOUNDED ITEM Compounded progesterone SR 100mg .  1 capsule daily. 90 each 1   OXcarbazepine (TRILEPTAL) 150 MG tablet Take 1 tablet (150 mg total) by mouth 2 (two) times daily. 180 tablet 1   pregabalin (LYRICA) 50 MG capsule Take 50 mg by mouth 2 (two) times daily.     traZODone (DESYREL) 100 MG tablet Take 1/2-1 tablet po QHS prn insomnia (Patient not taking: Reported on 10/22/2022) 30 tablet 2   UNABLE TO FIND Med Name: NAC 600 mg BID     venlafaxine XR (EFFEXOR-XR) 75 MG 24 hr capsule TAKE ONE CAPSULE BY MOUTH DAILY WITH BREAKFAST 90 capsule 0   No current facility-administered medications on file prior to visit.    Allergies  Allergen Reactions   Amoxicillin  Rash    Assessment/Plan:  1. Hyperlipidemia -  Mixed hyperlipidemia Assessment: Baseline LDL-C does appear greater than 190. Patient has been taking ezetimibe 10 mg daily.  LDL-C is 162 Patient reports that she is not always very compliant with her cholesterol medication.  Does not always refill her med box and if she does not do this then she does not take her cholesterol medication.  Patient states she has been better about being compliant with her cholesterol medication Does water aerobics 3 times a week.  Will try to walk with her neighbor in the afternoons Patient eats out often.  Is going to try to cut back on this.  She does have a history of overeating and was in overeaters Anonymous for several years. We discussed her cardiovascular risk.  Her CAC score is 0.  Although this is reassuring, about one third of heart attacks occur with a CAC of 0.  Given her LDL-C greater than 190 she qualifies for high intensity statin.  However she does not tolerate high intensity statin.  Patient agreeable to try moderate intensity statin once or twice a week.  Plan: Start rosuvastatin 5 mg once per week.  May increase to twice per week if tolerating well Continue ezetimibe 10 mg daily Recheck labs 7/8 I have encouraged patient to increase her fiber intake and decrease her saturated fat intake Increase exercise   Thank you,  Olene Floss, Pharm.D, BCPS, CPP [facility] [address] [place], [place] [address]  Phone: [phone]; Fax: [phone]

## 2022-11-05 NOTE — Patient Instructions (Addendum)
Please start taking rosuvastatin  once a week. You may increase to twice per week if tolerating once per week.  Please call me with any issues [phone]  Try to decrease your fried food intake. Increase fiber intake- vegetable, fruits, whole grains, nuts, seeds, legumes)  Recheck labs 7/8- fasting labs

## 2022-11-05 NOTE — Assessment & Plan Note (Signed)
Assessment: Baseline LDL-C does appear greater than 190. Patient has been taking ezetimibe 10 mg daily.  LDL-C is 162 Patient reports that she is not always very compliant with her cholesterol medication.  Does not always refill her med box and if she does not do this then she does not take her cholesterol medication. Patient states she has been better about being compliant with her cholesterol medication Does water aerobics 3 times a week.  Will try to walk with her neighbor in the afternoons Patient eats out often.  Is going to try to cut back on this.  She does have a history of overeating and was in overeaters Anonymous for several years. We discussed her cardiovascular risk.  Her CAC score is 0.  Although this is reassuring, about one third of heart attacks occur with a CAC of 0.  Given her LDL-C greater than 190 she qualifies for high intensity statin.  However she does not tolerate high intensity statin.  Patient agreeable to try moderate intensity statin once or twice a week.  Plan: Start rosuvastatin 5 mg once per week.  May increase to twice per week if tolerating well Continue ezetimibe 10 mg daily Recheck labs 7/8 I have encouraged patient to increase her fiber intake and decrease her saturated fat intake Increase exercise

## 2022-11-19 ENCOUNTER — Other Ambulatory Visit: Payer: Self-pay | Admitting: Cardiology

## 2022-11-19 DIAGNOSIS — I471 Supraventricular tachycardia, unspecified: Secondary | ICD-10-CM

## 2022-11-20 ENCOUNTER — Other Ambulatory Visit: Payer: Self-pay | Admitting: Obstetrics & Gynecology

## 2022-11-20 DIAGNOSIS — Z1231 Encounter for screening mammogram for malignant neoplasm of breast: Secondary | ICD-10-CM

## 2022-11-25 DIAGNOSIS — R300 Dysuria: Secondary | ICD-10-CM | POA: Diagnosis not present

## 2022-12-15 ENCOUNTER — Other Ambulatory Visit: Payer: Self-pay

## 2022-12-15 ENCOUNTER — Encounter (HOSPITAL_COMMUNITY): Payer: Self-pay | Admitting: Emergency Medicine

## 2022-12-15 ENCOUNTER — Ambulatory Visit (HOSPITAL_COMMUNITY)
Admission: EM | Admit: 2022-12-15 | Discharge: 2022-12-15 | Disposition: A | Discharge status: Home / Self Care | Payer: No Typology Code available for payment source | Attending: Internal Medicine | Admitting: Internal Medicine

## 2022-12-15 DIAGNOSIS — N390 Urinary tract infection, site not specified: Secondary | ICD-10-CM | POA: Insufficient documentation

## 2022-12-15 DIAGNOSIS — R300 Dysuria: Secondary | ICD-10-CM | POA: Diagnosis not present

## 2022-12-15 LAB — POCT URINALYSIS DIP (MANUAL ENTRY)
Bilirubin, UA: NEGATIVE
Glucose, UA: NEGATIVE mg/dL
Ketones, POC UA: NEGATIVE mg/dL
Nitrite, UA: POSITIVE — AB
Protein Ur, POC: 30 mg/dL — AB
Spec Grav, UA: 1.015 (ref 1.010–1.025)
Urobilinogen, UA: 0.2 E.U./dL
pH, UA: 5.5 (ref 5.0–8.0)

## 2022-12-15 MED ORDER — PHENAZOPYRIDINE HCL 200 MG PO TABS: 200.0000 mg | ORAL_TABLET | Freq: Three times a day (TID) | ORAL | 0 refills | Status: DC

## 2022-12-15 MED ORDER — CIPROFLOXACIN HCL 500 MG PO TABS: 500.0000 mg | ORAL_TABLET | Freq: Two times a day (BID) | ORAL | 0 refills | Status: AC

## 2022-12-15 NOTE — ED Provider Notes (Signed)
[facility]    CSN: [id number] Arrival date & time: 12/15/22  1545      History   Chief Complaint Chief Complaint  Patient presents with   Urinary Frequency    Uti.  I just finished anti iotics last week for one.  Have the pain and bladder spamming of one.  Also had blood when I wiped last night.  Ugh - Entered by patient   Dysuria    Pt c/o burning sensation, blood on urine and frequency that started last night. Pt had UTI last week and finished her PO abx treatment last Wednesday.    HPI Sarah Hogan is a 62 y.o. female.   Patient presents to urgent care for evaluation of dysuria, urinary frequency, and urinary urgency that started suddenly last night.  Patient has a history of overactive bladder syndrome and multiple sclerosis.  She takes Myrbetriq daily for overactive bladder and states that this usually helps with the urinary urgency and frequency.  When she went to her urologist 2 weeks ago at [facility], she was diagnosed with urinary tract infection and placed on Keflex antibiotic 3 times daily for 10 days.  Patient Keflex 3 times daily for 10 days and states symptoms went away but then returned abruptly yesterday.  She finished the Keflex antibiotic on Wednesday, Dec 11, 2022.  Denies fever, chills, body aches, nausea, vomiting, diarrhea, low back pain, dizziness, rash, and decreased oral intake.  She does report some suprapubic abdominal discomfort.  She states she was recently on vacation with family and had a lot of sugar intake.  She is concerned that her recent change in diet with increased sugar has contributed to some of her urinary tract infection symptoms. Denies recent steroid use. She is not a diabetic and does not take an SGLT-2 inhibitor. Requesting refill of pyridium today for urinary pain.    Urinary Frequency  Dysuria   Past Medical History:  Diagnosis Date   Allergy    Anxiety    Bipolar 2 disorder (HCC)    Cervical polyp 12/16/2018    Depression    Former smoker 12/16/2018   Hyperlipidemia    Leukopenia    Mild aortic insufficiency    mild by echo 09/2022   Mild mitral regurgitation    by echo 07/2016   MS (multiple sclerosis) (HCC)    Neuromuscular disorder (HCC)    MS    Normal coronary arteries cath 2015   Overactive bladder    PVC's (premature ventricular contractions) 04/22/2017   SVT (supraventricular tachycardia) 04/22/2017   Vitamin D deficiency     Patient Active Problem List   Diagnosis Date Noted   History of skin cancer 10/22/2022   Mixed hyperlipidemia 10/16/2021   Slow transit constipation 10/16/2021   Elevated serum creatinine 12/13/2019   MS (multiple sclerosis) (HCC)    Cervical polyp 12/16/2018   History of steroid therapy 12/16/2018   Former smoker 12/16/2018   Statin intolerance 12/16/2018   Visual disturbance of one eye 08/12/2018   Circadian rhythm sleep disorder, unspecified type 06/02/2018   Vitamin D deficiency 12/10/2017   PVC's (premature ventricular contractions) 04/22/2017   SVT (supraventricular tachycardia) 04/22/2017   Normal coronary arteries    Mild mitral regurgitation    Mild aortic insufficiency    Overactive bladder    Bipolar II disorder, moderate, depressed, with anxious distress (HCC)    Generalized anxiety disorder 04/09/2016   Fibrocystic disease of breast 04/09/2016   Migraine 04/09/2016   Palpitations  04/09/2016    Past Surgical History:  Procedure Laterality Date   BREAST BIOPSY Left 2002   CARPAL TUNNEL RELEASE     CHOLECYSTECTOMY     COLONOSCOPY  2010   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     pigmented squamous cell carcinoma removal     POLYPECTOMY  2010   1 polyp- rectum , no path    UPPER GASTROINTESTINAL ENDOSCOPY  2010    OB History     Gravida  0   Para  0   Term  0   Preterm  0   AB  0   Living  0      SAB  0   IAB  0   Ectopic  0   Multiple  0   Live Births  0            Home Medications    Prior  to Admission medications   Medication Sig Start Date End Date Taking? Authorizing Provider  ciprofloxacin (CIPRO) 500 MG tablet Take 1 tablet (500 mg total) by mouth 2 (two) times daily for 5 days. 12/15/22 12/20/22 Yes StanhopeDonavan Burnet, FNP  phenazopyridine (PYRIDIUM) 200 MG tablet Take 1 tablet (200 mg total) by mouth 3 (three) times daily. 12/15/22  Yes Carlisle Beers, FNP  baclofen (LIORESAL) 10 MG tablet Take 10 mg by mouth 2 (two) times daily. 06/19/22   [provider]  Biotin 5000 MCG CAPS Take by mouth.    [provider]  cholecalciferol (VITAMIN D3) 25 MCG (1000 UNIT) tablet Take 1,000 Units by mouth daily.    [provider]  Chromium Picolinate (CHROMIUM PICOLATE PO) Take by mouth.    [provider]  clonazePAM (KLONOPIN) 0.5 MG tablet Take 1 tablet (0.5 mg total) by mouth daily as needed for anxiety. Patient not taking: Reported on 10/22/2022 04/25/21   Corie Chiquito, PMHNP  ezetimibe (ZETIA) 10 MG tablet TAKE ONE TABLET BY MOUTH DAILY 06/17/22   Early, Sung Amabile, NP  Fiber 500 MG CAPS Take by mouth.    [provider]  metoprolol succinate (TOPROL-XL) 25 MG 24 hr tablet TAKE 1 TABLET BY MOUTH EVERY MORNING AND 1 TABLET AT BEDTIME 11/20/22   Turner, Cornelious Bryant, MD  modafinil (PROVIGIL) 100 MG tablet Take 100 mg by mouth daily as needed (Driving). Patient not taking: Reported on 10/22/2022    [provider]  MYRBETRIQ 25 MG TB24 tablet Take 1 tablet (25 mg total) by mouth daily. 10/16/21   Jake Shark, PA-C  NONFORMULARY OR COMPOUNDED ITEM Compounded progesterone SR 100mg .  1 capsule daily. 06/10/22   Jerene Bears, MD  OXcarbazepine (TRILEPTAL) 150 MG tablet Take 1 tablet (150 mg total) by mouth 2 (two) times daily. 11/04/22   Corie Chiquito, PMHNP  pregabalin (LYRICA) 50 MG capsule Take 50 mg by mouth 2 (two) times daily. 10/10/22   [provider]  rosuvastatin (CRESTOR) 5 MG tablet Take 1 tablet by mouth once or  twice a week 11/05/22   Quintella Reichert, MD  traZODone (DESYREL) 100 MG tablet Take 1/2-1 tablet po QHS prn insomnia Patient not taking: Reported on 10/22/2022 06/05/22   Corie Chiquito, PMHNP  UNABLE TO FIND Med Name: NAC 600 mg BID    [provider]  venlafaxine XR (EFFEXOR-XR) 75 MG 24 hr capsule TAKE ONE CAPSULE BY MOUTH DAILY WITH BREAKFAST 11/04/22   Corie Chiquito, PMHNP    Family History Family History  Problem Relation  Age of Onset   Stroke Mother 29   Dementia Mother    Bipolar disorder Mother    Hypertension Brother    Hyperlipidemia Brother    Obesity Brother    Breast cancer Paternal Grandmother        over 66   Skin cancer Paternal Grandmother    Colon polyps Brother    Colon cancer Neg Hx    Esophageal cancer Neg Hx    Rectal cancer Neg Hx    Stomach cancer Neg Hx     Social History Social History   Tobacco Use   Smoking status: Former    Packs/day: 1.50    Years: 20.00    Additional pack years: 0.00    Total pack years: 30.00    Types: Cigarettes    Quit date: 10/28/1994    Years since quitting: 28.1   Smokeless tobacco: Never  Vaping Use   Vaping Use: Never used  Substance Use Topics   Alcohol use: Yes    Alcohol/week: 0.0 - 1.0 standard drinks of alcohol    Comment: occ   Drug use: No     Allergies   Amoxicillin   Review of Systems Review of Systems  Genitourinary:  Positive for dysuria and frequency.  Her HPI   Physical Exam Triage Vital Signs ED Triage Vitals  Enc Vitals Group     BP 12/15/22 1555 130/71     Pulse Rate 12/15/22 1555 65     Resp 12/15/22 1555 18     Temp 12/15/22 1555 98.3 F (36.8 C)     Temp Source 12/15/22 1555 Oral     SpO2 12/15/22 1555 98 %     Weight 12/15/22 1556 211 lb 10.3 oz (96 kg)     Height 12/15/22 1556 5\' 6"  (1.676 m)     Head Circumference --      Peak Flow --      Pain Score 12/15/22 1556 1     Pain Loc --      Pain Edu? --      Excl. in GC? --    No data found.  Updated Vital  Signs BP 130/71 (BP Location: Right Arm)   Pulse 65   Temp 98.3 F (36.8 C) (Oral)   Resp 18   Ht 5\' 6"  (1.676 m)   Wt 211 lb 10.3 oz (96 kg)   LMP 06/28/2008   SpO2 98%   BMI 34.16 kg/m   Visual Acuity Right Eye Distance:   Left Eye Distance:   Bilateral Distance:    Right Eye Near:   Left Eye Near:    Bilateral Near:     Physical Exam Vitals and nursing note reviewed.  Constitutional:      Appearance: She is not ill-appearing or toxic-appearing.  HENT:     Head: Normocephalic and atraumatic.     Right Ear: Hearing and external ear normal.     Left Ear: Hearing and external ear normal.     Nose: Nose normal.     Mouth/Throat:     Lips: Pink.  Eyes:     General: Lids are normal. Vision grossly intact. Gaze aligned appropriately.     Extraocular Movements: Extraocular movements intact.     Conjunctiva/sclera: Conjunctivae normal.  Pulmonary:     Effort: Pulmonary effort is normal.  Abdominal:     General: Bowel sounds are normal.     Palpations: Abdomen is soft.     Tenderness: There is no abdominal  tenderness. There is no right CVA tenderness, left CVA tenderness or guarding.  Musculoskeletal:     Cervical back: Neck supple.  Skin:    General: Skin is warm and dry.     Capillary Refill: Capillary refill takes less than 2 seconds.     Findings: No rash.  Neurological:     General: No focal deficit present.     Mental Status: She is alert and oriented to person, place, and time. Mental status is at baseline.     Cranial Nerves: No dysarthria or facial asymmetry.  Psychiatric:        Mood and Affect: Mood normal.        Speech: Speech normal.        Behavior: Behavior normal.        Thought Content: Thought content normal.        Judgment: Judgment normal.      UC Treatments / Results  Labs (all labs ordered are listed, but only abnormal results are displayed) Labs Reviewed  POCT URINALYSIS DIP (MANUAL ENTRY) - Abnormal; Notable for the following  components:      Result Value   Color, UA yellow (*)    Blood, UA large (*)    Protein Ur, POC =30 (*)    Nitrite, UA Positive (*)    Leukocytes, UA Small (1+) (*)    All other components within normal limits  URINE CULTURE    EKG   Radiology No results found.  Procedures Procedures (including critical care time)  Medications Ordered in UC Medications - No data to display  Initial Impression / Assessment and Plan / UC Course  I have reviewed the triage vital signs and the nursing notes.  Pertinent labs & imaging results that were available during my care of the patient were reviewed by me and considered in my medical decision making (see chart for details).   1.  Recurrent UTI, dysuria Reviewed note, urinalysis, and urine culture from visit 2 weeks ago with urologist. Urine culture from Nov 25, 2022 grew 60,000-100,000 colonies of E. Coli which is susceptible to treatment with Keflex.  Urinalysis today shows probable recurrent urinary tract infection, urine culture is pending.  Will treat with ciprofloxacin twice daily for 5 days.  Patient states that she has had this in the past and has tolerated this well.  Advised to take antibiotic with food to avoid stomach upset.  No systemic signs or symptoms of infection, low suspicion for pyelonephritis.  Refilled Pyridium, may take this as needed for urinary discomfort and pain.  Advised to push fluids to stay well-hydrated.  Encouraged to schedule an appointment for follow-up with the urologist to discuss recurrent urinary tract infections versus colonization of bacteria in the bladder.  She is agreeable with this plan.  Strict ER and urgent care return precautions have been discussed.  Discussed physical exam and available lab work findings in clinic with patient.  Counseled patient regarding appropriate use of medications and potential side effects for all medications recommended or prescribed today. Discussed red flag signs and symptoms of  worsening condition,when to call the PCP office, return to urgent care, and when to seek higher level of care in the emergency department. Patient verbalizes understanding and agreement with plan. All questions answered. Patient discharged in stable condition.   Final Clinical Impressions(s) / UC Diagnoses   Final diagnoses:  Recurrent UTI (urinary tract infection)  Dysuria     Discharge Instructions      You have  another UTI.  Take ciprofloxacin twice daily for the next 5 days with food to avoid stomach upset. You may take pyridium for pain as needed. Push fluids (water) to stay well hydrated. Schedule a follow-up appointment with your urologist.   If you develop any new or worsening symptoms or do not improve in the next 2 to 3 days, please return.  If your symptoms are severe, please go to the emergency room.  Follow-up with your primary care provider for further evaluation and management of your symptoms as well as ongoing wellness visits.  I hope you feel better!   ED Prescriptions     Medication Sig Dispense Auth. Provider   phenazopyridine (PYRIDIUM) 200 MG tablet Take 1 tablet (200 mg total) by mouth 3 (three) times daily. 6 tablet Reita May M, FNP   ciprofloxacin (CIPRO) 500 MG tablet Take 1 tablet (500 mg total) by mouth 2 (two) times daily for 5 days. 10 tablet Carlisle Beers, FNP      PDMP not reviewed this encounter.   Carlisle Beers, [place] 12/15/22 1701

## 2022-12-15 NOTE — ED Triage Notes (Signed)
Pt c/o burning sensation, blood on urine and frequency that started last night. Pt had UTI last week and finished her PO abx treatment last Wednesday.

## 2022-12-15 NOTE — Discharge Instructions (Addendum)
You have another UTI.  Take ciprofloxacin twice daily for the next 5 days with food to avoid stomach upset. You may take pyridium for pain as needed. Push fluids (water) to stay well hydrated. Schedule a follow-up appointment with your urologist.   If you develop any new or worsening symptoms or do not improve in the next 2 to 3 days, please return.  If your symptoms are severe, please go to the emergency room.  Follow-up with your primary care provider for further evaluation and management of your symptoms as well as ongoing wellness visits.  I hope you feel better!

## 2022-12-17 LAB — URINE CULTURE: Culture: 20000 — AB

## 2022-12-26 ENCOUNTER — Telehealth: Payer: Self-pay | Admitting: Nurse Practitioner

## 2022-12-26 ENCOUNTER — Ambulatory Visit
Admission: RE | Admit: 2022-12-26 | Discharge: 2022-12-26 | Disposition: A | Discharge status: Home / Self Care | Payer: No Typology Code available for payment source | Source: Ambulatory Visit | Attending: Obstetrics & Gynecology | Admitting: Obstetrics & Gynecology

## 2022-12-26 DIAGNOSIS — Z1231 Encounter for screening mammogram for malignant neoplasm of breast: Secondary | ICD-10-CM

## 2022-12-26 NOTE — Telephone Encounter (Signed)
Pharmacy sent refill request for ezetimbie  Please send to the [facility] [phone] [place], [place] - [address]

## 2022-12-27 ENCOUNTER — Other Ambulatory Visit (HOSPITAL_BASED_OUTPATIENT_CLINIC_OR_DEPARTMENT_OTHER): Payer: Self-pay | Admitting: Obstetrics & Gynecology

## 2022-12-27 DIAGNOSIS — Z780 Asymptomatic menopausal state: Secondary | ICD-10-CM

## 2022-12-27 MED ORDER — NONFORMULARY OR COMPOUNDED ITEM: 3 refills | Status: DC

## 2022-12-30 ENCOUNTER — Other Ambulatory Visit: Payer: Self-pay

## 2022-12-30 MED ORDER — EZETIMIBE 10 MG PO TABS: 10.0000 mg | ORAL_TABLET | Freq: Every day | ORAL | 1 refills | Status: DC

## 2023-01-05 ENCOUNTER — Encounter (HOSPITAL_COMMUNITY): Payer: Self-pay

## 2023-01-05 ENCOUNTER — Ambulatory Visit (INDEPENDENT_AMBULATORY_CARE_PROVIDER_SITE_OTHER): Payer: No Typology Code available for payment source

## 2023-01-05 ENCOUNTER — Ambulatory Visit (HOSPITAL_COMMUNITY)
Admission: EM | Admit: 2023-01-05 | Discharge: 2023-01-05 | Disposition: A | Discharge status: Home / Self Care | Payer: No Typology Code available for payment source | Attending: Emergency Medicine | Admitting: Emergency Medicine

## 2023-01-05 DIAGNOSIS — S99921A Unspecified injury of right foot, initial encounter: Secondary | ICD-10-CM | POA: Diagnosis not present

## 2023-01-05 DIAGNOSIS — W19XXXA Unspecified fall, initial encounter: Secondary | ICD-10-CM | POA: Diagnosis not present

## 2023-01-05 DIAGNOSIS — S2231XA Fracture of one rib, right side, initial encounter for closed fracture: Secondary | ICD-10-CM

## 2023-01-05 DIAGNOSIS — M25531 Pain in right wrist: Secondary | ICD-10-CM | POA: Diagnosis not present

## 2023-01-05 DIAGNOSIS — S63501A Unspecified sprain of right wrist, initial encounter: Secondary | ICD-10-CM

## 2023-01-05 DIAGNOSIS — M7989 Other specified soft tissue disorders: Secondary | ICD-10-CM | POA: Diagnosis not present

## 2023-01-05 DIAGNOSIS — M79671 Pain in right foot: Secondary | ICD-10-CM | POA: Diagnosis not present

## 2023-01-05 MED ORDER — OXYCODONE-ACETAMINOPHEN 5-325 MG PO TABS: 1.0000 | ORAL_TABLET | Freq: Four times a day (QID) | ORAL | 0 refills | Status: DC | PRN

## 2023-01-05 MED ORDER — HYDROCODONE-ACETAMINOPHEN 5-325 MG PO TABS
1.0000 | ORAL_TABLET | Freq: Once | ORAL | Status: AC
  Administered 2023-01-05: 1 via ORAL

## 2023-01-05 MED ORDER — HYDROCODONE-ACETAMINOPHEN 5-325 MG PO TABS
ORAL_TABLET | ORAL | Status: AC
  Filled 2023-01-05: qty 1

## 2023-01-05 NOTE — ED Triage Notes (Signed)
Patient here today after having a fall yesterday. Patient was walking down steps and when she got to the bottom step, her sandal got stuck and she fell forward onto asphalt on her right side. She injured her right wrist, right foot, and right side rib area. It hurts to breath. Increased pain with weightbearing on her right foot and increased pain with ROM of her right wrist. She has a small abrasion on her right shoulder.

## 2023-01-05 NOTE — ED Provider Notes (Signed)
[facility]    CSN: [id number] Arrival date & time: 01/05/23  1324      History   Chief Complaint Chief Complaint  Patient presents with   Fall    HPI Sarah Hogan is a 62 y.o. female.  Yesterday her sandal got caught on a stair and she fell. Landed on her right side on asphalt. Did not hit head, no LOC. Not anticoagulated. Pain in right wrist, right ribs, right foot. Hurts to take a deep breath but denies shortness of breath.  Bruising on extremities but none over ribs or belly Overnight unable to sleep due to pain. Swelling has increased today She tried ibuprofen, ice, biofreeze with minimal relief  Hx MS  Past Medical History:  Diagnosis Date   Allergy    Anxiety    Bipolar 2 disorder (HCC)    Cervical polyp 12/16/2018   Depression    Former smoker 12/16/2018   Hyperlipidemia    Leukopenia    Mild aortic insufficiency    mild by echo 09/2022   Mild mitral regurgitation    by echo 07/2016   MS (multiple sclerosis) (HCC)    Neuromuscular disorder (HCC)    MS    Normal coronary arteries cath 2015   Overactive bladder    PVC's (premature ventricular contractions) 04/22/2017   SVT (supraventricular tachycardia) 04/22/2017   Vitamin D deficiency     Patient Active Problem List   Diagnosis Date Noted   History of skin cancer 10/22/2022   Mixed hyperlipidemia 10/16/2021   Slow transit constipation 10/16/2021   Elevated serum creatinine 12/13/2019   MS (multiple sclerosis) (HCC)    Cervical polyp 12/16/2018   History of steroid therapy 12/16/2018   Former smoker 12/16/2018   Statin intolerance 12/16/2018   Visual disturbance of one eye 08/12/2018   Circadian rhythm sleep disorder, unspecified type 06/02/2018   Vitamin D deficiency 12/10/2017   PVC's (premature ventricular contractions) 04/22/2017   SVT (supraventricular tachycardia) 04/22/2017   Normal coronary arteries    Mild mitral regurgitation    Mild aortic insufficiency    Overactive  bladder    Bipolar II disorder, moderate, depressed, with anxious distress (HCC)    Generalized anxiety disorder 04/09/2016   Fibrocystic disease of breast 04/09/2016   Migraine 04/09/2016   Palpitations 04/09/2016    Past Surgical History:  Procedure Laterality Date   BREAST BIOPSY Left 2002   CARPAL TUNNEL RELEASE     CHOLECYSTECTOMY     COLONOSCOPY  2010   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     pigmented squamous cell carcinoma removal     POLYPECTOMY  2010   1 polyp- rectum , no path    UPPER GASTROINTESTINAL ENDOSCOPY  2010    OB History     Gravida  0   Para  0   Term  0   Preterm  0   AB  0   Living  0      SAB  0   IAB  0   Ectopic  0   Multiple  0   Live Births  0            Home Medications    Prior to Admission medications   Medication Sig Start Date End Date Taking? Authorizing Provider  baclofen (LIORESAL) 10 MG tablet Take 10 mg by mouth 2 (two) times daily. 06/19/22  Yes [provider]  Biotin 5000 MCG CAPS Take by mouth.   Yes [provider]  cholecalciferol (VITAMIN D3) 25 MCG (1000 UNIT) tablet Take 1,000 Units by mouth daily.   Yes [provider]  Chromium Picolinate (CHROMIUM PICOLATE PO) Take by mouth.   Yes [provider]  ezetimibe (ZETIA) 10 MG tablet Take 1 tablet (10 mg total) by mouth daily. 12/30/22  Yes Early, Sung Amabile, NP  metoprolol succinate (TOPROL-XL) 25 MG 24 hr tablet TAKE 1 TABLET BY MOUTH EVERY MORNING AND 1 TABLET AT BEDTIME 11/20/22  Yes Turner, Traci R, MD  MYRBETRIQ 25 MG TB24 tablet Take 1 tablet (25 mg total) by mouth daily. 10/16/21  Yes Burnard Hawthorne B, PA-C  NONFORMULARY OR COMPOUNDED ITEM Compounded progesterone SR 100mg .  1 capsule daily. 12/27/22  Yes Jerene Bears, MD  OXcarbazepine (TRILEPTAL) 150 MG tablet Take 1 tablet (150 mg total) by mouth 2 (two) times daily. 11/04/22  Yes Corie Chiquito, PMHNP  oxyCODONE-acetaminophen (PERCOCET/ROXICET) 5-325 MG  tablet Take 1 tablet by mouth every 6 (six) hours as needed for severe pain. 01/05/23  Yes Aerilyn Slee, Lurena Joiner, PA-C  pregabalin (LYRICA) 50 MG capsule Take 50 mg by mouth 2 (two) times daily. 10/10/22  Yes [provider]  venlafaxine XR (EFFEXOR-XR) 75 MG 24 hr capsule TAKE ONE CAPSULE BY MOUTH DAILY WITH BREAKFAST 11/04/22  Yes Corie Chiquito, PMHNP  modafinil (PROVIGIL) 100 MG tablet Take 100 mg by mouth daily as needed (Driving). Patient not taking: Reported on 10/22/2022    [provider]    Family History Family History  Problem Relation Age of Onset   Stroke Mother 80   Dementia Mother    Bipolar disorder Mother    Hypertension Brother    Hyperlipidemia Brother    Obesity Brother    Breast cancer Paternal Grandmother        over 75   Skin cancer Paternal Grandmother    Colon polyps Brother    Colon cancer Neg Hx    Esophageal cancer Neg Hx    Rectal cancer Neg Hx    Stomach cancer Neg Hx     Social History Social History   Tobacco Use   Smoking status: Former    Packs/day: 1.50    Years: 20.00    Additional pack years: 0.00    Total pack years: 30.00    Types: Cigarettes    Quit date: 10/28/1994    Years since quitting: 28.2   Smokeless tobacco: Never  Vaping Use   Vaping Use: Never used  Substance Use Topics   Alcohol use: Yes    Alcohol/week: 0.0 - 1.0 standard drinks of alcohol    Comment: occ   Drug use: No     Allergies   Amoxicillin   Review of Systems Review of Systems As per HPI  Physical Exam Triage Vital Signs ED Triage Vitals  Enc Vitals Group     BP 01/05/23 1408 108/67     Pulse Rate 01/05/23 1408 63     Resp 01/05/23 1408 16     Temp 01/05/23 1408 97.6 F (36.4 C)     Temp Source 01/05/23 1408 Oral     SpO2 01/05/23 1408 98 %     Weight 01/05/23 1408 218 lb (98.9 kg)     Height 01/05/23 1408 5\' 7"  (1.702 m)     Head Circumference --      Peak Flow --      Pain Score 01/05/23 1407 8     Pain Loc --      Pain Edu?  --  Excl. in GC? --    No data found.  Updated Vital Signs BP 108/67 (BP Location: Left Arm)   Pulse 63   Temp 97.6 F (36.4 C) (Oral)   Resp 16   Ht 5\' 7"  (1.702 m)   Wt 218 lb (98.9 kg)   LMP 06/28/2008   SpO2 98%   BMI 34.14 kg/m    Physical Exam Vitals and nursing note reviewed.  Constitutional:      General: She is not in acute distress. HENT:     Head: Atraumatic. No abrasion, contusion or masses.     Jaw: There is normal jaw occlusion.     Mouth/Throat:     Pharynx: Oropharynx is clear.  Cardiovascular:     Rate and Rhythm: Normal rate and regular rhythm.     Pulses: Normal pulses.  Pulmonary:     Effort: Pulmonary effort is normal.  Musculoskeletal:     Right shoulder: No swelling, deformity, tenderness or bony tenderness. Normal range of motion.     Right upper arm: Normal.     Right elbow: Normal.     Right forearm: Normal.     Right wrist: Swelling (minimal) present. No deformity. Decreased range of motion (due to pain). Normal pulse.     Right hand: Normal range of motion. Normal strength. Normal sensation. Normal capillary refill. Normal pulse.     Cervical back: Normal range of motion. No rigidity.     Right hip: Normal. No bony tenderness. Normal range of motion.     Right knee: Normal.     Right ankle: Normal.     Right foot: Swelling and tenderness present.     Comments: Minimal swelling right wrist. Decreased ROM. No bony tenderness of upper extremity.  Swelling of dorsal right foot. Full ROM at ankle.   Skin:    Capillary Refill: Capillary refill takes 2 to 3 seconds.     Findings: Abrasion and bruising present. No laceration or wound.     Comments: Bruise on right dorsal wrist. Bruise on right dorsal foot. No bruising or ecchymosis of abdomen, flank, back. Shallow abrasion on right outer shoulder  Neurological:     Mental Status: She is alert and oriented to person, place, and time.     Comments: Grip strength intact. Distal sensation intact  throughout. Cap refill fingers < 2 seconds. Cap refill toes about 3 seconds.     UC Treatments / Results  Labs (all labs ordered are listed, but only abnormal results are displayed) Labs Reviewed - No data to display  EKG  Radiology DG Foot Complete Right  Result Date: 01/05/2023 CLINICAL DATA:  Trauma, fall, pain EXAM: RIGHT FOOT COMPLETE - 3+ VIEW COMPARISON:  None Available. FINDINGS: No displaced fracture or dislocation is seen. There is soft tissue swelling over the dorsum. Minimal bony spurs are seen in first metatarsophalangeal joint. IMPRESSION: No recent fracture or dislocation is seen in right foot. Electronically Signed   By: Ernie Avena M.D.   On: 01/05/2023 15:25   DG Ribs Unilateral W/Chest Right  Result Date: 01/05/2023 CLINICAL DATA:  Trauma, fall EXAM: RIGHT RIBS AND CHEST - 3+ VIEW COMPARISON:  Coronary CT done on 10/09/2022 FINDINGS: Transverse diameter of heart is slightly increased. There are no signs of pulmonary edema or focal pulmonary consolidation. Small linear densities in the lower lung fields suggest scarring or subsegmental atelectasis. There is no pleural effusion or pneumothorax. Minimally displaced recent fracture is seen in the lateral aspect of right  seventh rib. There may be other undisplaced fractures in the adjacent ribs which are not radiographically visible. IMPRESSION: There is recent fracture in the lateral aspect of right seventh rib with 1 mm offset in alignment of fracture fragments. There are no signs of pulmonary edema or focal pulmonary consolidation. There is no pleural effusion or pneumothorax. Small linear densities in the lower lung fields may suggest scarring or minimal subsegmental atelectasis. Electronically Signed   By: Ernie Avena M.D.   On: 01/05/2023 15:23   DG Wrist Complete Right  Result Date: 01/05/2023 CLINICAL DATA:  Trauma, fall, pain and swelling EXAM: RIGHT WRIST - COMPLETE 3+ VIEW COMPARISON:  None Available.  FINDINGS: No recent fracture or dislocation is seen. There is soft tissue swelling around the wrist, more so along the medial aspect. Small bony spurs are seen in intercarpal joints and carpometacarpal joint in the lateral aspect of the wrist. IMPRESSION: No recent fracture or dislocation is seen in right wrist. Electronically Signed   By: Ernie Avena M.D.   On: 01/05/2023 15:19    Procedures Procedures (including critical care time)  Medications Ordered in UC Medications  HYDROcodone-acetaminophen (NORCO/VICODIN) 5-325 MG per tablet 1 tablet (1 tablet Oral Given 01/05/23 1457)    Initial Impression / Assessment and Plan / UC Course  I have reviewed the triage vital signs and the nursing notes.  Pertinent labs & imaging results that were available during my care of the patient were reviewed by me and considered in my medical decision making (see chart for details).  PDMP reviewed. Patient takes Lyrica. No narcotic use. Norco tablet given in clinic with precautions. Her neighbor drove her here today.  Much improvement in pain after medication. Able to ambulate in the room.  Xray RIGHT wrist and RIGHT foot negative for fracture.  Right chest/ribs with fracture at 7th rib. No sign of pneumothorax or any fluid. Images independently reviewed by me, agree with radiology interpretation.  Discussed findings with patient. Ace wrap applied for right wrist comfort. Advised continue ibuprofen, ice, biofreeze for pain control. Sent a few tablets Percocet to use for breakthrough pain. Discussed side effects and warnings associated with narcotic use. Patient agreeable to plan  Final Clinical Impressions(s) / UC Diagnoses   Final diagnoses:  Fall, initial encounter  Closed fracture of one rib of right side, initial encounter  Sprain of right wrist, initial encounter  Injury of right foot, initial encounter     Discharge Instructions      You have a fracture of your right rib. This will  heal on its own! You can apply ice to this area, as well as to the wrist and foot. Use for 20 minutes at a time.  The Ace wrap can be worn on the wrist for support. You likely have sprained it.  Continue taking ibuprofen for pain.  I have sent a few tablets of Percocet to the pharmacy; you can use every 6 hours as needed for breakthrogh pain. I recommend to just use before bedtime. Please be cautious as this is a narcotic medication. It will make you drowsy. Do not drink, drive, make judgement decisions, or take other sedating medications while using. There is a chance of addiction with use of narcotic medications.  Please monitor for worsening symptoms. Go to the emergency department with any change. Otherwise, please contact your primary care provider for follow up.     ED Prescriptions     Medication Sig Dispense Auth. Provider   oxyCODONE-acetaminophen (PERCOCET/ROXICET)  5-325 MG tablet Take 1 tablet by mouth every 6 (six) hours as needed for severe pain. 8 tablet Douglas Smolinsky, Lurena Joiner, PA-C      I have reviewed the PDMP during this encounter.   Marlow Baars, [place] 01/05/23 1615

## 2023-01-05 NOTE — Discharge Instructions (Addendum)
You have a fracture of your right rib. This will heal on its own! You can apply ice to this area, as well as to the wrist and foot. Use for 20 minutes at a time.  The Ace wrap can be worn on the wrist for support. You likely have sprained it.  Continue taking ibuprofen for pain.  I have sent a few tablets of Percocet to the pharmacy; you can use every 6 hours as needed for breakthrogh pain. I recommend to just use before bedtime. Please be cautious as this is a narcotic medication. It will make you drowsy. Do not drink, drive, make judgement decisions, or take other sedating medications while using. There is a chance of addiction with use of narcotic medications.  Please monitor for worsening symptoms. Go to the emergency department with any change. Otherwise, please contact your primary care provider for follow up.

## 2023-01-06 ENCOUNTER — Ambulatory Visit (HOSPITAL_COMMUNITY): Payer: No Typology Code available for payment source

## 2023-01-08 NOTE — Progress Notes (Unsigned)
No chief complaint on file.  Patient presents for urgent care follow-up. She sustained a fall on 6/15--her sandal got caught on a stair and she fell, landing on her right side on asphalt.  She was seen in UC on 6/16. She injured her R foot, R wrist. Xrays were negative for fracture (foot and wrist). She was found to have fracture at 7th rib IMPRESSION: There is recent fracture in the lateral aspect of right seventh rib with 1 mm offset in alignment of fracture fragments. There are no signs of pulmonary edema or focal pulmonary consolidation. There is no pleural effusion or pneumothorax. Small linear densities in the lower lung fields may suggest scarring or minimal subsegmental atelectasis.  Ace wrap applied to right wrist. Advised to continue ibuprofen, ice, biofreeze for pain control. Prescribed #8 tablets of Percocet to use for breakthrough pain     PMH, PSH, SH reviewed  MS, bipolar, HLD   ROS:   PHYSICAL EXAM:  LMP 06/28/2008   Wt Readings from Last 3 Encounters:  01/05/23 218 lb (98.9 kg)  12/15/22 211 lb 10.3 oz (96 kg)  10/22/22 211 lb (95.7 kg)      ASSESSMENT/PLAN:

## 2023-01-09 ENCOUNTER — Encounter: Payer: Self-pay | Admitting: Family Medicine

## 2023-01-09 ENCOUNTER — Ambulatory Visit: Payer: No Typology Code available for payment source | Admitting: Family Medicine

## 2023-01-09 VITALS — BP 92/64 | HR 78 | Wt 219.8 lb

## 2023-01-09 DIAGNOSIS — K5900 Constipation, unspecified: Secondary | ICD-10-CM | POA: Diagnosis not present

## 2023-01-09 DIAGNOSIS — S2231XD Fracture of one rib, right side, subsequent encounter for fracture with routine healing: Secondary | ICD-10-CM | POA: Diagnosis not present

## 2023-01-09 DIAGNOSIS — S60211D Contusion of right wrist, subsequent encounter: Secondary | ICD-10-CM

## 2023-01-09 DIAGNOSIS — S9031XD Contusion of right foot, subsequent encounter: Secondary | ICD-10-CM

## 2023-01-09 MED ORDER — OXYCODONE-ACETAMINOPHEN 5-325 MG PO TABS
1.0000 | ORAL_TABLET | Freq: Four times a day (QID) | ORAL | 0 refills | Status: DC | PRN
Start: 2023-01-09 — End: 2023-01-29

## 2023-01-09 NOTE — Patient Instructions (Signed)
  I refilled your pain medication. Use this only at night, sparingly. Consider taking miralax daily (as needed) while taking the pain medication, since you already are dealing with constipation. Be sure to drink a full glass of water with your fiber capsules. Try using SalonPas with Lidocaine to see if this helps more than the Biofreeze. Consider sleeping on the couch vs lowering the bed to make it less painful to get to bed.  Ice and elevate the right foot--this should help decrease the throbbing and pain. Consider a compression sock or sleeve if you will be up on your feet for long periods.  Follow up if symptoms persist, worsen, or change.

## 2023-01-21 DIAGNOSIS — R300 Dysuria: Secondary | ICD-10-CM | POA: Diagnosis not present

## 2023-01-27 ENCOUNTER — Ambulatory Visit: Payer: No Typology Code available for payment source | Attending: Cardiology

## 2023-01-27 DIAGNOSIS — E782 Mixed hyperlipidemia: Secondary | ICD-10-CM

## 2023-01-27 LAB — LIPID PANEL
Chol/HDL Ratio: 3.5 ratio (ref 0.0–4.4)
Cholesterol, Total: 173 mg/dL (ref 100–199)
HDL: 50 mg/dL (ref >39)
LDL Chol Calc (NIH): 100 mg/dL — ABNORMAL HIGH (ref 0–99)
Triglycerides: 130 mg/dL (ref 0–149)
VLDL Cholesterol Cal: 23 mg/dL (ref 5–40)

## 2023-01-27 LAB — HEPATIC FUNCTION PANEL
ALT: 11 IU/L (ref 0–32)
AST: 13 IU/L (ref 0–40)
Albumin: 4.1 g/dL (ref 3.9–4.9)
Alkaline Phosphatase: 115 IU/L (ref 44–121)
Bilirubin Total: 0.3 mg/dL (ref 0.0–1.2)
Bilirubin, Direct: <0.1 mg/dL (ref 0.00–0.40)
Total Protein: 6.2 g/dL (ref 6.0–8.5)

## 2023-01-28 ENCOUNTER — Encounter: Payer: Self-pay | Admitting: Pharmacist

## 2023-01-28 NOTE — Progress Notes (Unsigned)
No chief complaint on file.   She was last seen on 6/20 as urgent care follow-up. She sustained a fall on 6/15, seen in UC on 6/16 and diagnosed with fracture of $ 7th rib (lateral aspect). She had also injured her R foot, R wrist (no fractures). Prescribed #8 of percocet by UC on 6/16, and #8 by me on 6/20.  XR R foot 6/16: FINDINGS: No displaced fracture or dislocation is seen. There is soft tissue swelling over the dorsum. Minimal bony spurs are seen in first metatarsophalangeal joint.   IMPRESSION: No recent fracture or dislocation is seen in right foot.     PHYSICAL EXAM:  LMP 06/28/2008   Pleasant female, in no distress HEENT: conjunctiva and sclera are clear, EOMI Neck: no lymphadenopathy, thyromegaly or mass, no c-spine tenderness Back: no spinal or CVA tenderness Chest: Tender at R anterior rib, nontender over floating ribs Heart: regular rate and rhythm Lungs: clear bilaterally Abdomen: soft, nontedner Extremities:  Tender over the R 5th metacarpal.  Bruising across the top of the right foot, with swelling Tender at the metatarsals, mainly the 4th MT  Purple bruising on toes and R medial ankle/arch of foot, nontender 2+ pulses No pitting edema   UPDATE ALL   ASSESSMENT/PLAN:

## 2023-01-29 ENCOUNTER — Ambulatory Visit (INDEPENDENT_AMBULATORY_CARE_PROVIDER_SITE_OTHER): Payer: No Typology Code available for payment source | Admitting: Family Medicine

## 2023-01-29 ENCOUNTER — Encounter: Payer: Self-pay | Admitting: Family Medicine

## 2023-01-29 VITALS — BP 96/68 | HR 68 | Wt 217.0 lb

## 2023-01-29 DIAGNOSIS — M79671 Pain in right foot: Secondary | ICD-10-CM

## 2023-01-29 DIAGNOSIS — N390 Urinary tract infection, site not specified: Secondary | ICD-10-CM | POA: Diagnosis not present

## 2023-01-29 DIAGNOSIS — R319 Hematuria, unspecified: Secondary | ICD-10-CM | POA: Diagnosis not present

## 2023-01-29 DIAGNOSIS — R350 Frequency of micturition: Secondary | ICD-10-CM

## 2023-01-29 DIAGNOSIS — R300 Dysuria: Secondary | ICD-10-CM

## 2023-01-29 LAB — POCT URINALYSIS DIP (CLINITEK)
Glucose, UA: NEGATIVE mg/dL
Nitrite, UA: POSITIVE — AB
POC PROTEIN,UA: 30 — AB
Spec Grav, UA: 1.02 (ref 1.010–1.025)
Urobilinogen, UA: 2 E.U./dL — AB
pH, UA: 6 (ref 5.0–8.0)

## 2023-01-29 MED ORDER — SULFAMETHOXAZOLE-TRIMETHOPRIM 800-160 MG PO TABS
1.0000 | ORAL_TABLET | Freq: Two times a day (BID) | ORAL | 0 refills | Status: DC
Start: 2023-01-29 — End: 2023-03-13

## 2023-01-29 NOTE — Patient Instructions (Signed)
  Stay well hydrated. Try and avoiding holding your urine for long period of time. Continue to wipe front to back. Take the antibiotic twice daily. We will let you know the culture results by MyChart. If you are getting worse while waiting for the results (especially if any flank pain, vomiting or fever, please contact us right away to have the antibiotic changed prior to getting that final result.  In the future, try using cranberry extract tablets either regularly, or at the earliest onset of symptoms, in order to try and prevent an E.coli UTI.  You should wait to start the new medication (methenamine) until you have completed your antibiotics and don't have an infection.

## 2023-01-31 LAB — URINE CULTURE

## 2023-02-01 LAB — URINE CULTURE

## 2023-02-03 ENCOUNTER — Ambulatory Visit: Payer: No Typology Code available for payment source | Admitting: Psychiatry

## 2023-02-03 ENCOUNTER — Other Ambulatory Visit: Payer: Self-pay | Admitting: Cardiology

## 2023-02-03 DIAGNOSIS — I471 Supraventricular tachycardia, unspecified: Secondary | ICD-10-CM

## 2023-02-05 ENCOUNTER — Encounter: Payer: Self-pay | Admitting: Psychiatry

## 2023-02-05 ENCOUNTER — Ambulatory Visit: Payer: PRIVATE HEALTH INSURANCE | Admitting: Psychiatry

## 2023-02-05 DIAGNOSIS — F3181 Bipolar II disorder: Secondary | ICD-10-CM | POA: Diagnosis not present

## 2023-02-05 DIAGNOSIS — F411 Generalized anxiety disorder: Secondary | ICD-10-CM

## 2023-02-05 DIAGNOSIS — G4700 Insomnia, unspecified: Secondary | ICD-10-CM | POA: Diagnosis not present

## 2023-02-05 DIAGNOSIS — Z0289 Encounter for other administrative examinations: Secondary | ICD-10-CM

## 2023-02-05 MED ORDER — VENLAFAXINE HCL ER 75 MG PO CP24
ORAL_CAPSULE | ORAL | 1 refills | Status: DC
Start: 2023-02-05 — End: 2023-05-06

## 2023-02-05 MED ORDER — CLONAZEPAM 0.5 MG PO TABS
0.5000 mg | ORAL_TABLET | Freq: Every day | ORAL | 0 refills | Status: DC | PRN
Start: 2023-02-05 — End: 2023-09-10

## 2023-02-05 NOTE — Progress Notes (Signed)
Sarah Hogan [id number] Nov 09, 1960 62 y.o.  Subjective:   Patient ID:  Sarah Hogan is a 62 y.o. (DOB February 20, 1961) female.  Chief Complaint:  Chief Complaint  Patient presents with   Follow-up    Anxiety, Bipolar Disorder    HPI Sarah Hogan presents to the office today for follow-up of anxiety and Bipolar Disorder. Denies sad mood- "I'm back in just 'neutral.' " She reports that she finds current events "distressing" and it is hard to avoid exposure to news with upcoming election and recent events. She reports that she has been sitting and reading most of the time. She reports that reading is her way of "escaping." She reports, "I still am not getting anything done." She reports that she does her laundry and feeds her animals. She reports that she has difficulty "doing anything extra." She has been maintaining personal hygiene. Denies any recent manic symptoms. She reports that her sleep has been "weird" the last month. She had some middle of the night awakenings and then this resolved. She was unable to sleep Saturday night. She reports sleeping ok the last few nights. Concentration has been good for reading. Denies SI.   She reports that she started Weight Watchers since cholesterol was elevated and she was unable to tolerate a statin.   Had a severe fall in June when sandal caught the stair and sustained a cracked rib and a foot injury. She reports that she was "black and blue" after the fall. Injuries from fall have limited physical activity. She reports that she has had 3 UTI's in 2 months.   Has not been going to water aerobics. Going to Bible study.   Went on vacation with her brothers. Brothers noticed she was tapping her fingers while reading. She reports that she is sometimes biting the inside of her lip.   Klonopin last filled in 2022. She reports taking Klonopin on rare occasions to help with anxiety. She reports on very rare occasions she takes Trazodone if experiencing insomnia  (last script sent 2021).    [name] been helpful for her depression. Worsening TD on 20 mg.  Latuda- Effective. Took samples.  Seroquel- Took briefly. Had severe fatigue Effexor- Taken for several years Zoloft- Was effective and then stopped working Prozac Remeron- Lost 80 lbs when she stopped taking it. Buspar Trileptal- Helps stabilize mood. Has taken long-term. Lamictal-Worsening depression Tranxene Provigil Trazodone  AIMS    Flowsheet Row Office Visit from 11/04/2022 in [facility] Office Visit from 06/05/2022 in [facility] Office Visit from 04/03/2022 in [facility] Office Visit from 09/04/2021 in [facility] Office Visit from 06/05/2021 in [facility]  AIMS Total Score 5 6 7 10 5       PHQ2-9    Flowsheet Row Clinical Support from 10/22/2022 in [facility] Clinical Support from 10/16/2021 in [facility] Office Visit from 02/12/2021 in [facility] Clinical Support from 12/21/2020 in [facility] Clinical Support from 12/21/2019 in [facility]  PHQ-2 Total Score 0 2 0 0 0  PHQ-9 Total Score -- 6 -- 4 --      Flowsheet Row ED from 01/05/2023 in [facility] ED from 12/15/2022 in [facility] RISK CATEGORY No Risk No Risk        Review of Systems:  Review of Systems  Genitourinary:        Recent recurrent UTI's  Musculoskeletal:  Negative for gait problem.       Foot pain after fall. Has upcoming apt with podiatrist.  Psychiatric/Behavioral:         Please refer to HPI    Medications: I have reviewed the patient's current medications.  Current Outpatient Medications  Medication Sig Dispense Refill   baclofen (LIORESAL) 10 MG tablet Take 10 mg by mouth 2 (two) times daily.      Biotin 5000 MCG CAPS Take by mouth.     cholecalciferol (VITAMIN D3) 25 MCG (1000 UNIT) tablet Take 1,000 Units by mouth daily.     Chromium Picolinate (CHROMIUM PICOLATE PO) Take by mouth.     ezetimibe (ZETIA) 10 MG tablet Take 1 tablet (10 mg total) by mouth daily. 90 tablet 1   ibuprofen (ADVIL) 200 MG tablet Take 200 mg by mouth every 6 (six) hours as needed.     metoprolol succinate (TOPROL-XL) 25 MG 24 hr tablet Take 1 tablet (25 mg total) by mouth daily. 90 tablet 3   modafinil (PROVIGIL) 100 MG tablet Take 100 mg by mouth daily as needed (Driving).     MYRBETRIQ 25 MG TB24 tablet Take 1 tablet (25 mg total) by mouth daily. 90 tablet 1   NONFORMULARY OR COMPOUNDED ITEM Compounded progesterone SR 100mg .  1 capsule daily. 90 each 3   OXcarbazepine (TRILEPTAL) 150 MG tablet Take 1 tablet (150 mg total) by mouth 2 (two) times daily. 180 tablet 1   phenazopyridine (PYRIDIUM) 200 MG tablet Take 200 mg by mouth 3 (three) times daily as needed for pain. 1 daily     pregabalin (LYRICA) 50 MG capsule Take 50 mg by mouth 2 (two) times daily.     sulfamethoxazole-trimethoprim (BACTRIM DS) 800-160 MG tablet Take 1 tablet by mouth 2 (two) times daily. 14 tablet 0   clonazePAM (KLONOPIN) 0.5 MG tablet Take 1 tablet (0.5 mg total) by mouth daily as needed for anxiety. 30 tablet 0   venlafaxine XR (EFFEXOR-XR) 75 MG 24 hr capsule TAKE ONE CAPSULE BY MOUTH DAILY WITH BREAKFAST 90 capsule 1   No current facility-administered medications for this visit.    Medication Side Effects: None  Allergies:  Allergies  Allergen Reactions   Amoxicillin Rash    Past Medical History:  Diagnosis Date   Allergy    Anxiety    Bipolar 2 disorder (HCC)    Cervical polyp 12/16/2018   Depression    Former smoker 12/16/2018   Hyperlipidemia    Leukopenia    Mild aortic insufficiency    mild by echo 09/2022   Mild mitral regurgitation    by echo 07/2016   MS (multiple sclerosis) (HCC)    Neuromuscular  disorder (HCC)    MS    Normal coronary arteries cath 2015   Overactive bladder    PVC's (premature ventricular contractions) 04/22/2017   SVT (supraventricular tachycardia) 04/22/2017   Vitamin D deficiency     Past Medical History, Surgical history, Social history, and Family history were reviewed and updated as appropriate.   Please see review of systems for further details on the patient's review from today.   Objective:   Physical Exam:  LMP 06/28/2008   Physical Exam Constitutional:      General: She is not in acute distress. Musculoskeletal:        General: No deformity.  Neurological:     Mental Status: She is alert and  oriented to person, place, and time.     Coordination: Coordination normal.  Psychiatric:        Attention and Perception: Attention and perception normal. She does not perceive auditory or visual hallucinations.        Mood and Affect: Affect is not labile, blunt, angry or inappropriate.        Speech: Speech normal.        Behavior: Behavior normal.        Thought Content: Thought content normal. Thought content is not paranoid or delusional. Thought content does not include homicidal or suicidal ideation. Thought content does not include homicidal or suicidal plan.        Cognition and Memory: Cognition and memory normal.        Judgment: Judgment normal.     Comments: Insight intact Mood is dysthymic. Mood is mildly anxious when discussing current events     Lab Review:     Component Value Date/Time   NA 141 10/22/2022 1146   K 4.5 10/22/2022 1146   CL 104 10/22/2022 1146   CO2 22 10/22/2022 1146   GLUCOSE 89 10/22/2022 1146   GLUCOSE 87 02/26/2017 1126   BUN 15 10/22/2022 1146   CREATININE 1.01 (H) 10/22/2022 1146   CREATININE 1.13 (H) 02/26/2017 1126   CALCIUM 9.8 10/22/2022 1146   PROT 6.2 01/27/2023 1052   ALBUMIN 4.1 01/27/2023 1052   AST 13 01/27/2023 1052   ALT 11 01/27/2023 1052   ALKPHOS 115 01/27/2023 1052   BILITOT 0.3  01/27/2023 1052   GFRNONAA 57 (L) 12/21/2019 1027   GFRAA 66 12/21/2019 1027       Component Value Date/Time   WBC 4.3 10/22/2022 1146   WBC 2.2 (L) 02/26/2017 1126   RBC 5.00 10/22/2022 1146   RBC 4.41 02/26/2017 1126   HGB 14.1 10/22/2022 1146   HCT 42.1 10/22/2022 1146   PLT 220 10/22/2022 1146   MCV 84 10/22/2022 1146   MCH 28.2 10/22/2022 1146   MCH 28.3 02/26/2017 1126   MCHC 33.5 10/22/2022 1146   MCHC 32.7 02/26/2017 1126   RDW 12.7 10/22/2022 1146   LYMPHSABS 1.3 10/22/2022 1146   MONOABS 220 02/26/2017 1126   EOSABS 0.1 10/22/2022 1146   BASOSABS 0.0 10/22/2022 1146    No results found for: "POCLITH", "LITHIUM"   No results found for: "PHENYTOIN", "PHENOBARB", "VALPROATE", "CBMZ"   .res Assessment: Plan:   25 minutes spent dedicated to the care of this patient on the date of this encounter to include pre-visit review of records, ordering of medication, post visit documentation, and face-to-face time with the patient discussing recent mood and anxiety symptoms, request for form to be completed for Disability insurance renewal, and Klonopin for anxiety. Pt reports that overall her mood and anxiety symptoms are manageable and she would like to continue medications without changes at this time. She reports that tardive dyskinesia continues to improve since discontinuation of antipsychotics.  Will continue Effexor XR 75 mg daily for anxiety and mood. Continue Trileptal 150 mg po BID for anxiety and mood stabilization.  Pt reports that she rarely uses Klonopin prn and script was last filled in 2022. She requests refill to have as needed for anxiety.  Pt to follow-up in 3 months or sooner if clinically indicated.  Patient advised to contact office with any questions, adverse effects, or acute worsening in signs and symptoms.   Guila was seen today for follow-up.  Diagnoses and all orders for this visit:  Bipolar 2 disorder (HCC) -     venlafaxine XR (EFFEXOR-XR) 75 MG  24 hr capsule; TAKE ONE CAPSULE BY MOUTH DAILY WITH BREAKFAST  Generalized anxiety disorder -     venlafaxine XR (EFFEXOR-XR) 75 MG 24 hr capsule; TAKE ONE CAPSULE BY MOUTH DAILY WITH BREAKFAST -     clonazePAM (KLONOPIN) 0.5 MG tablet; Take 1 tablet (0.5 mg total) by mouth daily as needed for anxiety.  Insomnia, unspecified type     Please see After Visit Summary for patient specific instructions.  Future Appointments  Date Time Provider Department Center  02/11/2023  3:00 PM [place], [place], [place] [facility]  05/06/2023 11:30 AM Corie Chiquito, PMHNP [facility] None  10/28/2023 11:15 AM Early, Sung Amabile, NP [facility]    No orders of the defined types were placed in this encounter.   -------------------------------

## 2023-02-11 ENCOUNTER — Ambulatory Visit (INDEPENDENT_AMBULATORY_CARE_PROVIDER_SITE_OTHER): Payer: No Typology Code available for payment source

## 2023-02-11 ENCOUNTER — Ambulatory Visit: Payer: No Typology Code available for payment source | Admitting: Podiatry

## 2023-02-11 DIAGNOSIS — S92344A Nondisplaced fracture of fourth metatarsal bone, right foot, initial encounter for closed fracture: Secondary | ICD-10-CM | POA: Diagnosis not present

## 2023-02-11 DIAGNOSIS — M778 Other enthesopathies, not elsewhere classified: Secondary | ICD-10-CM

## 2023-02-12 NOTE — Progress Notes (Signed)
Subjective:  Patient ID: Sarah Hogan, female    DOB: 1960/07/28,  MRN: [id number] HPI Chief Complaint  Patient presents with   Foot Pain    Patient came in today for top of the right foot pain, started on June 16th when the patient fell, patient seen urgent care and had X-Rays at that time, patient rates the pain 3 out of 10, X-Rays done today, TX: pain meds     62 y.o. female presents with the above complaint.   ROS: Denies fever chills nausea vomit muscle aches pains calf pain back pain chest pain shortness of breath.  Past Medical History:  Diagnosis Date   Allergy    Anxiety    Bipolar 2 disorder (HCC)    Cervical polyp 12/16/2018   Depression    Former smoker 12/16/2018   Hyperlipidemia    Leukopenia    Mild aortic insufficiency    mild by echo 09/2022   Mild mitral regurgitation    by echo 07/2016   MS (multiple sclerosis) (HCC)    Neuromuscular disorder (HCC)    MS    Normal coronary arteries cath 2015   Overactive bladder    PVC's (premature ventricular contractions) 04/22/2017   SVT (supraventricular tachycardia) 04/22/2017   Vitamin D deficiency    Past Surgical History:  Procedure Laterality Date   BREAST BIOPSY Left 2002   CARPAL TUNNEL RELEASE     CHOLECYSTECTOMY     COLONOSCOPY  2010   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     pigmented squamous cell carcinoma removal     POLYPECTOMY  2010   1 polyp- rectum , no path    UPPER GASTROINTESTINAL ENDOSCOPY  2010    Current Outpatient Medications:    baclofen (LIORESAL) 10 MG tablet, Take 10 mg by mouth 2 (two) times daily., Disp: , Rfl:    Biotin 5000 MCG CAPS, Take by mouth., Disp: , Rfl:    cholecalciferol (VITAMIN D3) 25 MCG (1000 UNIT) tablet, Take 1,000 Units by mouth daily., Disp: , Rfl:    Chromium Picolinate (CHROMIUM PICOLATE PO), Take by mouth., Disp: , Rfl:    clonazePAM (KLONOPIN) 0.5 MG tablet, Take 1 tablet (0.5 mg total) by mouth daily as needed for anxiety., Disp: 30 tablet, Rfl:  0   ezetimibe (ZETIA) 10 MG tablet, Take 1 tablet (10 mg total) by mouth daily., Disp: 90 tablet, Rfl: 1   ibuprofen (ADVIL) 200 MG tablet, Take 200 mg by mouth every 6 (six) hours as needed., Disp: , Rfl:    metoprolol succinate (TOPROL-XL) 25 MG 24 hr tablet, Take 1 tablet (25 mg total) by mouth daily., Disp: 90 tablet, Rfl: 3   modafinil (PROVIGIL) 100 MG tablet, Take 100 mg by mouth daily as needed (Driving)., Disp: , Rfl:    MYRBETRIQ 25 MG TB24 tablet, Take 1 tablet (25 mg total) by mouth daily., Disp: 90 tablet, Rfl: 1   NONFORMULARY OR COMPOUNDED ITEM, Compounded progesterone SR 100mg .  1 capsule daily., Disp: 90 each, Rfl: 3   OXcarbazepine (TRILEPTAL) 150 MG tablet, Take 1 tablet (150 mg total) by mouth 2 (two) times daily., Disp: 180 tablet, Rfl: 1   phenazopyridine (PYRIDIUM) 200 MG tablet, Take 200 mg by mouth 3 (three) times daily as needed for pain. 1 daily, Disp: , Rfl:    pregabalin (LYRICA) 50 MG capsule, Take 50 mg by mouth 2 (two) times daily., Disp: , Rfl:    sulfamethoxazole-trimethoprim (BACTRIM DS) 800-160 MG tablet, Take 1 tablet by  mouth 2 (two) times daily., Disp: 14 tablet, Rfl: 0   venlafaxine XR (EFFEXOR-XR) 75 MG 24 hr capsule, TAKE ONE CAPSULE BY MOUTH DAILY WITH BREAKFAST, Disp: 90 capsule, Rfl: 1  Allergies  Allergen Reactions   Amoxicillin Rash   Review of Systems Objective:  There were no vitals filed for this visit.  General: Well developed, nourished, in no acute distress, alert and oriented x3   Dermatological: Skin is warm, dry and supple bilateral. Nails x 10 are well maintained; remaining integument appears unremarkable at this time. There are no open sores, no preulcerative lesions, no rash or signs of infection present.  Vascular: Dorsalis Pedis artery and Posterior Tibial artery pedal pulses are 2/4 bilateral with immedate capillary fill time. Pedal hair growth present. No varicosities and no lower extremity edema present bilateral.    Neruologic: Grossly intact via light touch bilateral. Vibratory intact via tuning fork bilateral. Protective threshold with Semmes Wienstein monofilament intact to all pedal sites bilateral. Patellar and Achilles deep tendon reflexes 2+ bilateral. No Babinski or clonus noted bilateral.   Musculoskeletal: No gross boney pedal deformities bilateral. No pain, crepitus, or limitation noted with foot and ankle range of motion bilateral. Muscular strength 5/5 in all groups tested bilateral.  Swelling of the dorsal aspect of the right foot.  She also has pain on attempted range of motion of the fourth metatarsal near the base.  She has pain on direct palpation of this area.  There is less degree of pain on palpation and range of motion at the third tarsometatarsal joint.  Gait: Unassisted, Nonantalgic.    Radiographs:  Radiographs taken today demonstrate an osseously mature individual right foot generalized demineralization of the bone.  She does have an area of sclerosis at the proximal fourth metatarsal which appears to have been fractured but is nondisplaced.  Cannot rule out a fracture to the third metatarsal near the base.  Assessment & Plan:   Assessment: Probable healing fracture of the fourth metatarsal base right.  Plan: Placed her in a Darco shoe and request that she remain in the shoe for the next month     Ivonne Freeburg T. [place], [place]

## 2023-02-17 ENCOUNTER — Telehealth: Payer: Self-pay | Admitting: Nurse Practitioner

## 2023-02-17 NOTE — Telephone Encounter (Signed)
Marchelle Folks, a pharmacist at [facility] called and had questions about pt rosuvastatin and asks if you can call her back at [phone] extension [phone]

## 2023-02-19 ENCOUNTER — Telehealth: Payer: Self-pay

## 2023-02-19 NOTE — Telephone Encounter (Signed)
Pt calling stating that she received her medication metoprolol wrong from the pharmacy. Pt stated that she takes metoprolol 25 BID, did is not on pt's medication list, but pt would like to take metoprolol 50 mg daily. Pt would like a call back concerning this matter.

## 2023-02-26 MED ORDER — METOPROLOL SUCCINATE ER 50 MG PO TB24: 50.0000 mg | ORAL_TABLET | Freq: Every day | ORAL | 3 refills | Status: DC

## 2023-02-26 NOTE — Telephone Encounter (Signed)
Patient calling in and requesting corrected script for 50 mg toprol daily. She states she is currently taking 2x 25 mg tablets daily of Toprol.  Patient explains that she was on 50 mg Toprol daily for many years. At her last OV with Dr. Mayford Knife 09/06/22, her dose was reduced to 25 mg daily. A zio patch was ordered to assess for arrhythmias at that time. Those results were given to patient 10/14/22 and toprol was increased to 25 mg BID. Patient states she takes both doses at night and she feels this controls her palpitations. She is requesting that her script be refilled at 50 mg Toprol daily since this is how she takes it.  Unfortunately, patient does not check her BP or HR at home and states that her BP is "typically 90/60" and HR is around 60. She also confirms that she was supposed to have a follow up visit with T. Conte on 10/28/22 that she she did not attend. She states she is willing to come in for an appointment now to get her script changed to her preferred dosage of 50 mg Toprol XL daily. Forwarded to Dr. Mayford Knife for advice.

## 2023-02-26 NOTE — Telephone Encounter (Signed)
Patient scheduled for appt w/ APP 03/13/23. Advised patient to check her BP/HR BID for a week and bring those values in with her to appointment. Updated chart to reflect 50 mg dose daily of toprol XL.

## 2023-02-26 NOTE — Addendum Note (Signed)
Addended by: Luellen Pucker on: 02/26/2023 12:23 PM   Modules accepted: Orders

## 2023-03-11 ENCOUNTER — Ambulatory Visit: Payer: No Typology Code available for payment source | Admitting: Podiatry

## 2023-03-12 NOTE — Progress Notes (Unsigned)
Cardiology Office Note:  .   Date:  03/13/2023  ID:  Sarah Hogan, DOB [birth year], MRN [id number] PCP: Tollie Eth, NP  [facility] Providers Cardiologist:  Armanda Magic, MD {    History of Present Illness: Sarah Hogan is a 62 y.o. female with past medical history of hyperlipidemia, MS, GERD, migraines, bipolar disorder, tobacco abuse with a 40-year pack history of smoking, and normal coronary arteries by cath for positive stress test done for fatigue in 2015, history of SVT with PACs and PVCs on event monitor here for follow-up appointment.  Echo 08/2016 showed normal LVEF with ejection fraction 65 to 70% with mild AR and MR.   She was seen by Dr. Mayford Knife 09/06/2022 for follow-up.  Was doing well.  Had been having some palpitations.  These only occurred when she goes to bed at night.  She does not wake up with them.  She notices them when she is laying in bed watching TV.  Describes it as a fluttering sensation that lasts less than 1 to 2 minutes at a time.  It takes her breath away but does not make her dizzy or phalanx get a pass out.  She denied chest pain/pressure, SOB, DOE, PND, orthopnea, lower extremity edema, dizziness or syncope.  Compliant with medications.  Today, she tells me that she still has some dizziness that is coming and going.  Some shortness of breath.  She has been on metoprolol succinate 50 mg since 2005 but then was decreased down to 25 due to hypotension.  She then was increased back up to 50.  We will make sure that the proper prescription is in the computer.  She went to a funeral on Monday and had some palpitations at that time but was really upset.  Otherwise, doing okay from a CV standpoint.  Coming and going, some SOB. 50mg  of toprolol XL for a long time. 2005. Went to a funeral on Monday, some palpitations.  Reports no shortness of breath nor dyspnea on exertion. Reports no chest pain, pressure, or tightness. No edema, orthopnea, PND.    ROS:  Pertinent ROS in HPI  Studies Reviewed: .       Echocardiogram 10/11/2022 IMPRESSIONS     1. Left ventricular ejection fraction, by estimation, is 60 to 65%. The  left ventricle has normal function. The left ventricle has no regional  wall motion abnormalities. Left ventricular diastolic parameters are  consistent with Grade I diastolic  dysfunction (impaired relaxation).   2. Right ventricular systolic function is normal. The right ventricular  size is normal.   3. The mitral valve is normal in structure. Trivial mitral valve  regurgitation. No evidence of mitral stenosis.   4. The aortic valve is tricuspid. Aortic valve regurgitation is mild. No  aortic stenosis is present.   5. The inferior vena cava is normal in size with greater than 50%  respiratory variability, suggesting right atrial pressure of 3 mmHg.   FINDINGS   Left Ventricle: Left ventricular ejection fraction, by estimation, is 60  to 65%. The left ventricle has normal function. The left ventricle has no  regional wall motion abnormalities. The left ventricular internal cavity  size was normal in size. There is   no left ventricular hypertrophy. Left ventricular diastolic parameters  are consistent with Grade I diastolic dysfunction (impaired relaxation).  Normal left ventricular filling pressure.   Right Ventricle: The right ventricular size is normal. No increase in  right ventricular wall thickness.  Right ventricular systolic function is  normal.   Left Atrium: Left atrial size was normal in size.   Right Atrium: Right atrial size was normal in size.   Pericardium: There is no evidence of pericardial effusion.   Mitral Valve: The mitral valve is normal in structure. Trivial mitral  valve regurgitation. No evidence of mitral valve stenosis.   Tricuspid Valve: The tricuspid valve is normal in structure. Tricuspid  valve regurgitation is not demonstrated. No evidence of tricuspid  stenosis.   Aortic  Valve: The aortic valve is tricuspid. Aortic valve regurgitation is  mild. Aortic regurgitation PHT measures 754 msec. No aortic stenosis is  present.   Pulmonic Valve: The pulmonic valve was normal in structure. Pulmonic valve  regurgitation is not visualized. No evidence of pulmonic stenosis.   Aorta: The aortic root is normal in size and structure.   Venous: The inferior vena cava is normal in size with greater than 50%  respiratory variability, suggesting right atrial pressure of 3 mmHg.   IAS/Shunts: No atrial level shunt detected by color flow Doppler.  Physical Exam:   VS:  BP 106/64   Pulse 67   Ht 5\' 7"  (1.702 m)   Wt 219 lb 6.4 oz (99.5 kg)   LMP 06/28/2008   SpO2 97%   BMI 34.36 kg/m    Wt Readings from Last 3 Encounters:  03/13/23 219 lb 6.4 oz (99.5 kg)  01/29/23 217 lb (98.4 kg)  01/09/23 219 lb 12.8 oz (99.7 kg)    GEN: Well nourished, well developed in no acute distress NECK: No JVD; No carotid bruits CARDIAC: RRR, no murmurs, rubs, gallops RESPIRATORY:  Clear to auscultation without rales, wheezing or rhonchi  ABDOMEN: Soft, non-tender, non-distended EXTREMITIES:  No edema; No deformity   ASSESSMENT AND PLAN: .   1.  SVT/PVCs -Palpitations are better controlled on 50 mg daily of metoprolol. -Will update in the computer and make sure she has enough  2.  Mitral and aortic regurgitation -Most recent echocardiogram from March of this year reviewed -Will continue all annual echocardiograms -No new symptoms at this time  4.  HLD -Continue Zetia 10 mg daily -Triglycerides 130, LDL 100, HDL 50, at goal  5. No smoking since 1996 -Congratulated the patient on her long-term cessation      Dispo: Plan to follow-up with Dr. Mayford Knife in 6 months.  Signed, Sharlene Dory, PA-C

## 2023-03-13 ENCOUNTER — Ambulatory Visit: Payer: No Typology Code available for payment source | Attending: Physician Assistant | Admitting: Physician Assistant

## 2023-03-13 ENCOUNTER — Encounter: Payer: Self-pay | Admitting: Physician Assistant

## 2023-03-13 VITALS — BP 106/64 | HR 67 | Ht 67.0 in | Wt 219.4 lb

## 2023-03-13 DIAGNOSIS — R002 Palpitations: Secondary | ICD-10-CM | POA: Diagnosis not present

## 2023-03-13 DIAGNOSIS — I471 Supraventricular tachycardia, unspecified: Secondary | ICD-10-CM | POA: Diagnosis not present

## 2023-03-13 DIAGNOSIS — E785 Hyperlipidemia, unspecified: Secondary | ICD-10-CM

## 2023-03-13 DIAGNOSIS — I493 Ventricular premature depolarization: Secondary | ICD-10-CM | POA: Diagnosis not present

## 2023-03-13 DIAGNOSIS — I351 Nonrheumatic aortic (valve) insufficiency: Secondary | ICD-10-CM

## 2023-03-13 NOTE — Patient Instructions (Signed)
Medication Instructions:  Your physician recommends that you continue on your current medications as directed. Please refer to the Current Medication list given to you today. *If you need a refill on your cardiac medications before your next appointment, please call your pharmacy*   Lab Work: None ordered If you have labs (blood work) drawn today and your tests are completely normal, you will receive your results only by: [place] (if you have MyChart) OR A paper copy in the mail If you have any lab test that is abnormal or we need to change your treatment, we will call you to review the results.   Testing/Procedures: None ordered   Follow-Up: At [facility], you and your health needs are our priority.  As part of our continuing mission to provide you with exceptional heart care, we have created designated Provider Care Teams.  These Care Teams include your primary Cardiologist (physician) and Advanced Practice Providers (APPs -  Physician Assistants and Nurse Practitioners) who all work together to provide you with the care you need, when you need it.  We recommend signing up for the patient portal called "MyChart".  Sign up information is provided on this After Visit Summary.  MyChart is used to connect with patients for Virtual Visits (Telemedicine).  Patients are able to view lab/test results, encounter notes, upcoming appointments, etc.  Non-urgent messages can be sent to your provider as well.   To learn more about what you can do with MyChart, go to [email or web address].    Your next appointment:   6 month(s)  Provider:   Fransico Him, MD    Other Instructions

## 2023-03-17 ENCOUNTER — Ambulatory Visit: Payer: No Typology Code available for payment source | Admitting: Nurse Practitioner

## 2023-03-17 ENCOUNTER — Encounter: Payer: Self-pay | Admitting: Nurse Practitioner

## 2023-03-17 VITALS — BP 110/72 | HR 62 | Wt 218.2 lb

## 2023-03-17 DIAGNOSIS — G9332 Myalgic encephalomyelitis/chronic fatigue syndrome: Secondary | ICD-10-CM | POA: Diagnosis not present

## 2023-03-17 DIAGNOSIS — D8989 Other specified disorders involving the immune mechanism, not elsewhere classified: Secondary | ICD-10-CM | POA: Diagnosis not present

## 2023-03-17 DIAGNOSIS — K1379 Other lesions of oral mucosa: Secondary | ICD-10-CM | POA: Diagnosis not present

## 2023-03-17 DIAGNOSIS — G35 Multiple sclerosis: Secondary | ICD-10-CM | POA: Diagnosis not present

## 2023-03-17 DIAGNOSIS — Z79899 Other long term (current) drug therapy: Secondary | ICD-10-CM | POA: Diagnosis not present

## 2023-03-17 MED ORDER — AMBULATORY NON FORMULARY MEDICATION
1 refills | Status: DC
Start: 2023-03-17 — End: 2023-10-23

## 2023-03-17 NOTE — Progress Notes (Signed)
  Tollie Eth, DNP, AGNP-c [facility] [address] [place], [place] [address] [phone]   ACUTE VISIT- ESTABLISHED PATIENT  Blood pressure 110/72, pulse 62, weight 218 lb 3.2 oz (99 kg), last menstrual period 06/28/2008.  Subjective:  HPI Sarah Hogan is a 62 y.o. female presents to day for evaluation of acute concern(s).   Iyania endorses repeated presentation of sores in her mouth and on her gums for the past several weeks. She reports these will improve then return spontaneously with no known trigger. She has not had any new water sources, change in diet, new foods, dental work, or new oral treatments. She has not had any other associated symptoms including URI or GI disturbances. She does not have a history of GI conditions. She tells me that today they sores have healed, but were present yesterday.   ROS negative except for what is listed in HPI. History, Medications, Surgery, SDOH, and Family History reviewed and updated as appropriate.  Objective:  Physical Exam Vitals and nursing note reviewed.  Constitutional:      General: She is not in acute distress.    Appearance: Normal appearance. She is not ill-appearing.  HENT:     Head: Normocephalic.     Mouth/Throat:     Mouth: Mucous membranes are moist.     Pharynx: Oropharynx is clear. No posterior oropharyngeal erythema.     Comments: Healing ulceration noted on the right lateral side of the tongue.  Eyes:     Conjunctiva/sclera: Conjunctivae normal.  Neck:     Vascular: No carotid bruit.  Cardiovascular:     Rate and Rhythm: Normal rate and regular rhythm.     Pulses: Normal pulses.     Heart sounds: Normal heart sounds.  Pulmonary:     Effort: Pulmonary effort is normal.     Breath sounds: Normal breath sounds.  Musculoskeletal:        General: Normal range of motion.     Cervical back: Neck supple. No tenderness.  Lymphadenopathy:     Cervical: No cervical adenopathy.  Skin:    General:  Skin is warm and dry.     Capillary Refill: Capillary refill takes less than 2 seconds.  Neurological:     Mental Status: She is alert and oriented to person, place, and time.  Psychiatric:        Behavior: Behavior normal.         Assessment & Plan:   Problem List Items Addressed This Visit     Recurrent oral ulcers - Primary    At this time no clear etiology for her current symptoms. This does not appear to correlate with any chronic concern. We will obtain labs today for deficiencies that may contribute and monitor. For now will send in oral treatment with magic mouthwash with combination of nystatin, prednisolone, and lidocaine. Will review labs and monitor for new or worsening symptoms. If ulcerations return, we can have her come in for a culture swab as a nurse visit.       Relevant Medications   AMBULATORY NON FORMULARY MEDICATION   Other Relevant Orders   B12 and Folate Panel   CBC with Differential (Completed)   Iron, TIBC and Ferritin Panel (Completed)   CFIDS (chronic fatigue and immune dysfunction syndrome) (HCC)   MS (multiple sclerosis) (HCC)     Tollie Eth, DNP, AGNP-c

## 2023-03-18 ENCOUNTER — Ambulatory Visit (INDEPENDENT_AMBULATORY_CARE_PROVIDER_SITE_OTHER): Payer: No Typology Code available for payment source

## 2023-03-18 ENCOUNTER — Ambulatory Visit (INDEPENDENT_AMBULATORY_CARE_PROVIDER_SITE_OTHER): Payer: No Typology Code available for payment source | Admitting: Podiatry

## 2023-03-18 ENCOUNTER — Encounter: Payer: Self-pay | Admitting: Podiatry

## 2023-03-18 DIAGNOSIS — S92344D Nondisplaced fracture of fourth metatarsal bone, right foot, subsequent encounter for fracture with routine healing: Secondary | ICD-10-CM

## 2023-03-18 LAB — CBC WITH DIFFERENTIAL/PLATELET
Basophils Absolute: 0 10*3/uL (ref 0.0–0.2)
Basos: 1 %
EOS (ABSOLUTE): 0.1 10*3/uL (ref 0.0–0.4)
Eos: 2 %
Hematocrit: 40.6 % (ref 34.0–46.6)
Hemoglobin: 12.9 g/dL (ref 11.1–15.9)
Immature Grans (Abs): 0 10*3/uL (ref 0.0–0.1)
Immature Granulocytes: 0 %
Lymphocytes Absolute: 1.1 10*3/uL (ref 0.7–3.1)
Lymphs: 27 %
MCH: 27.3 pg (ref 26.6–33.0)
MCHC: 31.8 g/dL (ref 31.5–35.7)
MCV: 86 fL (ref 79–97)
Monocytes Absolute: 0.3 10*3/uL (ref 0.1–0.9)
Monocytes: 8 %
Neutrophils Absolute: 2.6 10*3/uL (ref 1.4–7.0)
Neutrophils: 62 %
Platelets: 189 10*3/uL (ref 150–450)
RBC: 4.72 x10E6/uL (ref 3.77–5.28)
RDW: 12.6 % (ref 11.7–15.4)
WBC: 4.1 10*3/uL (ref 3.4–10.8)

## 2023-03-18 LAB — IRON,TIBC AND FERRITIN PANEL
Ferritin: 77 ng/mL (ref 15–150)
Iron Saturation: 31 % (ref 15–55)
Iron: 86 ug/dL (ref 27–139)
Total Iron Binding Capacity: 279 ug/dL (ref 250–450)
UIBC: 193 ug/dL (ref 118–369)

## 2023-03-18 NOTE — Progress Notes (Signed)
She presents today for follow-up of her fracture fourth metatarsal of the right foot.  States that she could not wear the [place] shoe because she did not find anything that was similar equal height.  She states that it makes her back hurt.  She states that the right foot has been hurting however.  Objective: Vital signs are stable oriented x 3 there is no erythema edema cellulitis drainage or odor she has tenderness on palpation of the base of the fourth fifth tarsometatarsal joint and the third tarsometatarsal joint.  Radiographs taken today demonstrate nondislocated fractures that appear to be healing at the base of the third and fourth metatarsals.  Assessment: Well-healing fractures third fourth metatarsal bases.  Plan: Encouraged her to continue the use of a stiff soled shoe.  I will follow-up with her in about 6 weeks for another set of x-rays

## 2023-03-20 DIAGNOSIS — H2513 Age-related nuclear cataract, bilateral: Secondary | ICD-10-CM | POA: Diagnosis not present

## 2023-03-20 DIAGNOSIS — D3132 Benign neoplasm of left choroid: Secondary | ICD-10-CM | POA: Diagnosis not present

## 2023-03-20 DIAGNOSIS — H43813 Vitreous degeneration, bilateral: Secondary | ICD-10-CM | POA: Diagnosis not present

## 2023-03-22 DIAGNOSIS — D8989 Other specified disorders involving the immune mechanism, not elsewhere classified: Secondary | ICD-10-CM | POA: Insufficient documentation

## 2023-03-22 DIAGNOSIS — K1379 Other lesions of oral mucosa: Secondary | ICD-10-CM | POA: Insufficient documentation

## 2023-03-22 DIAGNOSIS — G9332 Myalgic encephalomyelitis/chronic fatigue syndrome: Secondary | ICD-10-CM | POA: Insufficient documentation

## 2023-03-22 NOTE — Assessment & Plan Note (Signed)
At this time no clear etiology for her current symptoms. This does not appear to correlate with any chronic concern. We will obtain labs today for deficiencies that may contribute and monitor. For now will send in oral treatment with magic mouthwash with combination of nystatin, prednisolone, and lidocaine. Will review labs and monitor for new or worsening symptoms. If ulcerations return, we can have her come in for a culture swab as a nurse visit.

## 2023-03-26 ENCOUNTER — Encounter: Payer: Self-pay | Admitting: Nurse Practitioner

## 2023-03-26 DIAGNOSIS — K1379 Other lesions of oral mucosa: Secondary | ICD-10-CM

## 2023-04-11 ENCOUNTER — Telehealth: Payer: Self-pay | Admitting: Cardiology

## 2023-04-11 NOTE — Telephone Encounter (Signed)
*  STAT* If patient is at the pharmacy, call can be transferred to refill team.   1. Which medications need to be refilled? (please list name of each medication and dose if known)  metoprolol succinate (TOPROL XL) 50 MG 24 hr tablet    2. Would you like to learn more about the convenience, safety, & potential cost savings by using the [facility]? N/A   3. Are you open to using the [facility] (Type [facility]. N/A   4. Which pharmacy/location (including street and city if local pharmacy) is medication to be sent to?  [id number] [place], [place] - [address]     5. Do they need a 30 day or 90 day supply? 90 day

## 2023-04-14 MED ORDER — METOPROLOL SUCCINATE ER 50 MG PO TB24: 50.0000 mg | ORAL_TABLET | Freq: Every day | ORAL | 3 refills | Status: DC

## 2023-04-14 NOTE — Telephone Encounter (Signed)
Pt's medication was sent to pt's pharmacy as requested. Confirmation received.  °

## 2023-04-25 DIAGNOSIS — G35 Multiple sclerosis: Secondary | ICD-10-CM | POA: Diagnosis not present

## 2023-04-25 DIAGNOSIS — E785 Hyperlipidemia, unspecified: Secondary | ICD-10-CM | POA: Diagnosis not present

## 2023-04-25 DIAGNOSIS — F17211 Nicotine dependence, cigarettes, in remission: Secondary | ICD-10-CM | POA: Diagnosis not present

## 2023-04-25 DIAGNOSIS — Z6833 Body mass index (BMI) 33.0-33.9, adult: Secondary | ICD-10-CM | POA: Diagnosis not present

## 2023-04-25 DIAGNOSIS — F319 Bipolar disorder, unspecified: Secondary | ICD-10-CM | POA: Diagnosis not present

## 2023-04-25 DIAGNOSIS — Z008 Encounter for other general examination: Secondary | ICD-10-CM | POA: Diagnosis not present

## 2023-04-25 DIAGNOSIS — E669 Obesity, unspecified: Secondary | ICD-10-CM | POA: Diagnosis not present

## 2023-04-25 DIAGNOSIS — I471 Supraventricular tachycardia, unspecified: Secondary | ICD-10-CM | POA: Diagnosis not present

## 2023-04-28 DIAGNOSIS — R339 Retention of urine, unspecified: Secondary | ICD-10-CM | POA: Diagnosis not present

## 2023-04-28 DIAGNOSIS — N3281 Overactive bladder: Secondary | ICD-10-CM | POA: Diagnosis not present

## 2023-04-29 ENCOUNTER — Ambulatory Visit: Payer: No Typology Code available for payment source | Admitting: Podiatry

## 2023-05-01 ENCOUNTER — Ambulatory Visit: Payer: No Typology Code available for payment source | Admitting: Podiatry

## 2023-05-06 ENCOUNTER — Encounter: Payer: Self-pay | Admitting: Psychiatry

## 2023-05-06 ENCOUNTER — Ambulatory Visit: Payer: No Typology Code available for payment source | Admitting: Psychiatry

## 2023-05-06 DIAGNOSIS — F3181 Bipolar II disorder: Secondary | ICD-10-CM | POA: Diagnosis not present

## 2023-05-06 DIAGNOSIS — F411 Generalized anxiety disorder: Secondary | ICD-10-CM | POA: Diagnosis not present

## 2023-05-06 MED ORDER — OXCARBAZEPINE 150 MG PO TABS
150.0000 mg | ORAL_TABLET | Freq: Two times a day (BID) | ORAL | 1 refills | Status: DC
Start: 2023-05-06 — End: 2023-09-10

## 2023-05-06 MED ORDER — LURASIDONE HCL 40 MG PO TABS
ORAL_TABLET | ORAL | 2 refills | Status: DC
Start: 2023-05-06 — End: 2023-06-09

## 2023-05-06 MED ORDER — VENLAFAXINE HCL ER 75 MG PO CP24
ORAL_CAPSULE | ORAL | 1 refills | Status: DC
Start: 2023-05-06 — End: 2023-09-10

## 2023-05-06 NOTE — Progress Notes (Signed)
Sarah Hogan [id number] 01-25-1961 62 y.o.  Subjective:   Patient ID:  Sarah Hogan is a 62 y.o. (DOB 11-20-1960) female.  Chief Complaint:  Chief Complaint  Patient presents with   Depression    HPI Sarah Hogan presents to the office today for follow-up of Bipolar disorder and anxiety. She reports, "I'm doing sort of ok, and sort of not... I'm not depressed, but I'm acting like I am depressed. I just don't care." She reports that she has not been as consistent with water aerobics. She reports that she has been binge eating and stopped going to [employer]. She went to the grocery store yesterday "specifically to buy binge foods." She reports that she is keeping up with laundry and falling behind on other chores. She reports some increased spending. Reports, "it's money I have, but don't usually spend." She has been eating out more and that this is a social event for her. She has fallen behind on paying bills because "I just don't care."  She reports, "I'm just not managing my life well." She has not been walking her dog as often. Denies sad mood. Denies irritability. She reports, "I'm getting flat again." She reports sleeping excessively. She reports that she has been having difficulty with concentration. She has been having difficulty comprehending what she reads in her Bible and following some discussions. She has had some anxiety in response to upcoming election and world events. Reports increased anxiety since learning she may have to start self-caths. She tries to avoid watching the news. Able to enjoy some things, such as reading, church, and going out with friends. Denies SI.    [name] been helpful for her depression. Worsening TD on 20 mg.  Latuda- Effective. Took samples.  Seroquel- Took briefly. Had severe fatigue Effexor- Taken for several years Zoloft- Was effective and then stopped working Prozac Remeron- Lost 80 lbs when she stopped taking it. Buspar Trileptal- Helps  stabilize mood. Has taken long-term. Lamictal-Worsening depression Tranxene Provigil Trazodone  AIMS    Flowsheet Row Office Visit from 11/04/2022 in [facility] Office Visit from 06/05/2022 in [facility] Office Visit from 04/03/2022 in [facility] Office Visit from 09/04/2021 in [facility] Office Visit from 06/05/2021 in [facility]  AIMS Total Score 5 6 7 10 5       PHQ2-9    Flowsheet Row Clinical Support from 10/22/2022 in [facility] Clinical Support from 10/16/2021 in [facility] Office Visit from 02/12/2021 in [facility] Clinical Support from 12/21/2020 in [facility] Clinical Support from 12/21/2019 in [facility]  PHQ-2 Total Score 0 2 0 0 0  PHQ-9 Total Score -- 6 -- 4 --      Flowsheet Row ED from 01[facility]05/2023 in [facility] from 12/15/2022 in [facility] at [facility] RISK CATEGORY No Risk No Risk        Review of Systems:  Review of Systems  Genitourinary:        Reports that she was told recently that she has some urinary retention. Recent UTI.  Musculoskeletal:  Negative for gait problem.  Psychiatric/Behavioral:         Please refer to HPI    Medications: I have reviewed the patient's current medications.  Current Outpatient Medications  Medication Sig Dispense Refill   baclofen (LIORESAL) 10  MG tablet Take 10 mg by mouth 2 (two) times daily.     cholecalciferol (VITAMIN D3) 25 MCG (1000 UNIT) tablet Take 1,000 Units by mouth daily.     Chromium Picolinate (CHROMIUM PICOLATE PO) Take by mouth.     clonazePAM (KLONOPIN) 0.5 MG tablet Take 1 tablet (0.5 mg total) by mouth daily as needed for anxiety. 30 tablet 0   ezetimibe (ZETIA) 10 MG tablet Take 1 tablet (10 mg total) by mouth  daily. 90 tablet 1   ibuprofen (ADVIL) 200 MG tablet Take 200 mg by mouth every 6 (six) hours as needed.     lurasidone (LATUDA) 40 MG TABS tablet Take 1/2 tablet daily with supper for one week, then increase to 1 tablet daily with supper 30 tablet 2   methenamine (HIPREX) 1 g tablet Take 1 g by mouth 2 (two) times daily.     metoprolol succinate (TOPROL XL) 50 MG 24 hr tablet Take 1 tablet (50 mg total) by mouth daily. Take with or immediately following a meal. 90 tablet 3   modafinil (PROVIGIL) 100 MG tablet Take 100 mg by mouth daily as needed (Driving).     MYRBETRIQ 25 MG TB24 tablet Take 1 tablet (25 mg total) by mouth daily. 90 tablet 1   NONFORMULARY OR COMPOUNDED ITEM Compounded progesterone SR 100mg .  1 capsule daily. 90 each 3   pregabalin (LYRICA) 50 MG capsule Take 50 mg by mouth 2 (two) times daily.     AMBULATORY NON FORMULARY MEDICATION Medication Name: Magic Mouthwash. 1 part viscous lidocaine 2%, 1 part prednisolone 15mg  per 5mL, 1 part Nystatin 100,000 u per 5mL. Swish 5mL up to 4 times a day for oral ulcers. May swallow if throat is involved. 300 mL 1   Biotin 5000 MCG CAPS Take by mouth. (Patient not taking: Reported on 05/06/2023)     OXcarbazepine (TRILEPTAL) 150 MG tablet Take 1 tablet (150 mg total) by mouth 2 (two) times daily. 180 tablet 1   venlafaxine XR (EFFEXOR-XR) 75 MG 24 hr capsule TAKE ONE CAPSULE BY MOUTH DAILY WITH BREAKFAST 90 capsule 1   No current facility-administered medications for this visit.    Medication Side Effects: None  Allergies:  Allergies  Allergen Reactions   Amoxicillin Rash   Statins     Past Medical History:  Diagnosis Date   Allergy    Anxiety    Bipolar 2 disorder (HCC)    Cervical polyp 12/16/2018   Depression    Former smoker 12/16/2018   Hyperlipidemia    Leukopenia    Mild aortic insufficiency    mild by echo 09/2022   Mild mitral regurgitation    by echo 07/2016   MS (multiple sclerosis) (HCC)    Neuromuscular  disorder (HCC)    MS    Normal coronary arteries cath 2015   Overactive bladder    PVC's (premature ventricular contractions) 04/22/2017   SVT (supraventricular tachycardia) (HCC) 04/22/2017   Vitamin D deficiency     Past Medical History, Surgical history, Social history, and Family history were reviewed and updated as appropriate.   Please see review of systems for further details on the patient's review from today.   Objective:   Physical Exam:  LMP 06/28/2008   Physical Exam Constitutional:      General: She is not in acute distress. Musculoskeletal:        General: No deformity.  Neurological:     Mental Status: She is alert and oriented to person,  place, and time.     Coordination: Coordination normal.  Psychiatric:        Attention and Perception: Attention and perception normal. She does not perceive auditory or visual hallucinations.        Mood and Affect: Mood is depressed. Mood is not anxious. Affect is not labile, blunt, angry or inappropriate.        Speech: Speech normal.        Behavior: Behavior normal.        Thought Content: Thought content normal. Thought content is not paranoid or delusional. Thought content does not include homicidal or suicidal ideation. Thought content does not include homicidal or suicidal plan.        Cognition and Memory: Cognition and memory normal.        Judgment: Judgment normal.     Comments: Insight intact     Lab Review:     Component Value Date/Time   NA 141 10/22/2022 1146   K 4.5 10/22/2022 1146   CL 104 10/22/2022 1146   CO2 22 10/22/2022 1146   GLUCOSE 89 10/22/2022 1146   GLUCOSE 87 02/26/2017 1126   BUN 15 10/22/2022 1146   CREATININE 1.01 (H) 10/22/2022 1146   CREATININE 1.13 (H) 02/26/2017 1126   CALCIUM 9.8 10/22/2022 1146   PROT 6.2 01/27/2023 1052   ALBUMIN 4.1 01/27/2023 1052   AST 13 01/27/2023 1052   ALT 11 01/27/2023 1052   ALKPHOS 115 01/27/2023 1052   BILITOT 0.3 01/27/2023 1052   GFRNONAA 57  (L) 12/21/2019 1027   GFRAA 66 12/21/2019 1027       Component Value Date/Time   WBC 4.1 03/17/2023 1222   WBC 2.2 (L) 02/26/2017 1126   RBC 4.72 03/17/2023 1222   RBC 4.41 02/26/2017 1126   HGB 12.9 03/17/2023 1222   HCT 40.6 03/17/2023 1222   PLT 189 03/17/2023 1222   MCV 86 03/17/2023 1222   MCH 27.3 03/17/2023 1222   MCH 28.3 02/26/2017 1126   MCHC 31.8 03/17/2023 1222   MCHC 32.7 02/26/2017 1126   RDW 12.6 03/17/2023 1222   LYMPHSABS 1.1 03/17/2023 1222   MONOABS 220 02/26/2017 1126   EOSABS 0.1 03/17/2023 1222   BASOSABS 0.0 03/17/2023 1222    No results found for: "POCLITH", "LITHIUM"   No results found for: "PHENYTOIN", "PHENOBARB", "VALPROATE", "CBMZ"   .res Assessment: Plan:    31 minutes spent dedicated to the care of this patient on the date of this encounter to include pre-visit review of records, ordering of medication, post visit documentation, and face-to-face time with the patient discussing treatment option for depressive symptoms, to include potential benefits, risks, and side effects of latuda. Pt agrees to trial of Latuda. Discussed discontinuing Latuda if TD occurs.  Will start Latuda 40 mg 1/2 tablet daily with supper for one week, then increase to 40 mg daily with supper for mood symptoms.  Continue Effexor XR 75 mg daily for mood and anxiety.  Continue Trileptal 150 mg twice daily for mood stabilization.  Pt reports that she does not need refills of Klonopin or Modafinil at this time.  Pt to follow-up with this provider in 4 weeks or sooner if clinically indicated.  Patient advised to contact office with any questions, adverse effects, or acute worsening in signs and symptoms.   Sarah Hogan was seen today for depression.  Diagnoses and all orders for this visit:  Bipolar 2 disorder (HCC) -     lurasidone (LATUDA) 40 MG TABS tablet;  Take 1/2 tablet daily with supper for one week, then increase to 1 tablet daily with supper -     OXcarbazepine  (TRILEPTAL) 150 MG tablet; Take 1 tablet (150 mg total) by mouth 2 (two) times daily. -     venlafaxine XR (EFFEXOR-XR) 75 MG 24 hr capsule; TAKE ONE CAPSULE BY MOUTH DAILY WITH BREAKFAST  Generalized anxiety disorder -     venlafaxine XR (EFFEXOR-XR) 75 MG 24 hr capsule; TAKE ONE CAPSULE BY MOUTH DAILY WITH BREAKFAST     Please see After Visit Summary for patient specific instructions.  Future Appointments  Date Time Provider Department Center  06/09/2023 11:00 AM Corie Chiquito, PMHNP [facility] None  10/28/2023 11:15 AM Early, Sung Amabile, NP [facility]    No orders of the defined types were placed in this encounter.   -------------------------------

## 2023-05-20 ENCOUNTER — Institutional Professional Consult (permissible substitution) (INDEPENDENT_AMBULATORY_CARE_PROVIDER_SITE_OTHER): Payer: No Typology Code available for payment source

## 2023-05-26 DIAGNOSIS — R339 Retention of urine, unspecified: Secondary | ICD-10-CM | POA: Diagnosis not present

## 2023-05-26 DIAGNOSIS — N3281 Overactive bladder: Secondary | ICD-10-CM | POA: Diagnosis not present

## 2023-05-31 ENCOUNTER — Encounter: Payer: Self-pay | Admitting: Nurse Practitioner

## 2023-06-03 DIAGNOSIS — M9903 Segmental and somatic dysfunction of lumbar region: Secondary | ICD-10-CM | POA: Diagnosis not present

## 2023-06-03 DIAGNOSIS — M9902 Segmental and somatic dysfunction of thoracic region: Secondary | ICD-10-CM | POA: Diagnosis not present

## 2023-06-03 DIAGNOSIS — M9901 Segmental and somatic dysfunction of cervical region: Secondary | ICD-10-CM | POA: Diagnosis not present

## 2023-06-03 DIAGNOSIS — M51362 Other intervertebral disc degeneration, lumbar region with discogenic back pain and lower extremity pain: Secondary | ICD-10-CM | POA: Diagnosis not present

## 2023-06-03 DIAGNOSIS — M50322 Other cervical disc degeneration at C5-C6 level: Secondary | ICD-10-CM | POA: Diagnosis not present

## 2023-06-04 ENCOUNTER — Encounter: Payer: Self-pay | Admitting: Psychiatry

## 2023-06-09 ENCOUNTER — Ambulatory Visit: Payer: No Typology Code available for payment source | Admitting: Psychiatry

## 2023-06-09 ENCOUNTER — Encounter: Payer: Self-pay | Admitting: Psychiatry

## 2023-06-09 DIAGNOSIS — F3181 Bipolar II disorder: Secondary | ICD-10-CM | POA: Diagnosis not present

## 2023-06-09 DIAGNOSIS — F411 Generalized anxiety disorder: Secondary | ICD-10-CM | POA: Diagnosis not present

## 2023-06-09 DIAGNOSIS — G4700 Insomnia, unspecified: Secondary | ICD-10-CM | POA: Diagnosis not present

## 2023-06-09 MED ORDER — LURASIDONE HCL 40 MG PO TABS: 40.0000 mg | ORAL_TABLET | Freq: Every day | ORAL | 1 refills | Status: DC

## 2023-06-09 NOTE — Progress Notes (Unsigned)
Sarah Hogan [id number] 1960/07/27 62 y.o.  Subjective:   Patient ID:  Sarah Hogan is a 62 y.o. (DOB 1960/11/10) female.  Chief Complaint:  Chief Complaint  Patient presents with   Follow-up    Bipolar Disorder, Anxiety    HPI Sarah Hogan presents to the office today for follow-up of mood disturbance and anxiety. Sarah Hogan reports, "I'm getting a little bit manic" and notices some increased spending- "it's not anything I can't manage." Sarah Hogan reports "my brain is clear. I can think. I am in a good mood. I'm Sarah Hogan." Sarah Hogan has been staying at home more due to pain. Sarah Hogan "went on a sugar binge over the weekend." Sarah Hogan reports that her appetite has been more than Sarah Hogan would like. "Motivation is good and energy is ok." Sarah Hogan reports that activities have been limited due to pain. Sarah Hogan reports that Sarah Hogan was able to be productive before back pain started. Denies irritability. Sarah Hogan reports that the "first week on Latuda my mouth was moving faster than my brain." Sarah Hogan reports that her concentration has been good. Sarah Hogan has been able to follow-up with things. Sarah Hogan describes sleep as "weird." Sarah Hogan reports altered sleep since time change. Anxiety is "pretty good." Denies SI.   Sarah Hogan reports that Sarah Hogan is having some "wooziness" when Sarah Hogan gets up to go to the bathroom during the middle of the night. Denies unsteadiness.   Denies any change in TD.   Rarely taking Klonopin prn or Trazodone prn.    Sarah Hogan been helpful for her depression. Worsening TD on 20 mg.  Latuda- Effective. Took samples.  Seroquel- Took briefly. Had severe fatigue Effexor- Taken for several years Zoloft- Was effective and then stopped working Prozac Remeron- Lost 80 lbs when Sarah Hogan stopped taking it. Buspar Trileptal- Helps stabilize mood. Has taken long-term. Lamictal-Worsening depression Tranxene Provigil Trazodone   AIMS    Flowsheet Row Office Visit from 06/09/2023 in [facility] Office Visit from 11/04/2022  in [facility] Office Visit from 06/05/2022 in [facility] Office Visit from 04/03/2022 in [facility] Office Visit from 09/04/2021 in [facility]  AIMS Total Score 3 5 6 7 10       PHQ2-9    Flowsheet Row Clinical Support from 10/22/2022 in [facility] Clinical Support from 10/16/2021 in [facility] Office Visit from 02/12/2021 in [facility] for [facility] Clinical Support from 12/21/2020 in [facility] Clinical Support from 12/21/2019 in [facility]  PHQ-2 Total Score 0 2 0 0 0  PHQ-9 Total Score -- 6 -- 4 --      Flowsheet Row ED from 01/05/2023 in [facility] ED from 12/15/2022 in [facility] at [facility] RISK CATEGORY No Risk No Risk        Review of Systems:  Review of Systems  Musculoskeletal:  Positive for back pain. Negative for gait problem.  Neurological:  Negative for tremors.       Sarah Hogan reports that Sarah Hogan is noticing the start of an MS flare  Psychiatric/Behavioral:         Please refer to HPI    Medications: I have reviewed the patient's current medications.  Current Outpatient Medications  Medication Sig Dispense Refill   traZODone (DESYREL) 100 MG tablet Take 100 mg by mouth at bedtime. Takes 1/2-1 tablet at bedtime as needed     AMBULATORY  NON FORMULARY MEDICATION Medication Name: Magic Mouthwash. 1 part viscous lidocaine 2%, 1 part prednisolone 15mg  per 5mL, 1 part Nystatin 100,000 u per 5mL. Swish 5mL up to 4 times a day for oral ulcers. May swallow if throat is involved. 300 mL 1   baclofen (LIORESAL) 10 MG tablet Take 10 mg by mouth 2 (two) times daily.     Biotin 5000 MCG CAPS Take by mouth. (Patient not taking: Reported on 05/06/2023)     cholecalciferol (VITAMIN D3) 25 MCG (1000 UNIT) tablet Take 1,000 Units by mouth daily.      Chromium Picolinate (CHROMIUM PICOLATE PO) Take by mouth.     clonazePAM (KLONOPIN) 0.5 MG tablet Take 1 tablet (0.5 mg total) by mouth daily as needed for anxiety. 30 tablet 0   ezetimibe (ZETIA) 10 MG tablet Take 1 tablet (10 mg total) by mouth daily. 90 tablet 1   ibuprofen (ADVIL) 200 MG tablet Take 200 mg by mouth every 6 (six) hours as needed.     lurasidone (LATUDA) 40 MG TABS tablet Take 1 tablet (40 mg total) by mouth daily with supper. 90 tablet 1   methenamine (HIPREX) 1 g tablet Take 1 g by mouth 2 (two) times daily.     metoprolol succinate (TOPROL XL) 50 MG 24 hr tablet Take 1 tablet (50 mg total) by mouth daily. Take with or immediately following a meal. 90 tablet 3   modafinil (PROVIGIL) 100 MG tablet Take 100 mg by mouth daily as needed (Driving).     MYRBETRIQ 25 MG TB24 tablet Take 1 tablet (25 mg total) by mouth daily. 90 tablet 1   NONFORMULARY OR COMPOUNDED ITEM Compounded progesterone SR 100mg .  1 capsule daily. 90 each 3   OXcarbazepine (TRILEPTAL) 150 MG tablet Take 1 tablet (150 mg total) by mouth 2 (two) times daily. 180 tablet 1   pregabalin (LYRICA) 50 MG capsule Take 50 mg by mouth 2 (two) times daily.     venlafaxine XR (EFFEXOR-XR) 75 MG 24 hr capsule TAKE ONE CAPSULE BY MOUTH DAILY WITH BREAKFAST 90 capsule 1   No current facility-administered medications for this visit.    Medication Side Effects: Other: Some light-headedness during the night.  Allergies:  Allergies  Allergen Reactions   Amoxicillin Rash   Statins     Past Medical History:  Diagnosis Date   Allergy    Anxiety    Bipolar 2 disorder (HCC)    Cervical polyp 12/16/2018   Depression    Former smoker 12/16/2018   Hyperlipidemia    Leukopenia    Mild aortic insufficiency    mild by echo 09/2022   Mild mitral regurgitation    by echo 07/2016   MS (multiple sclerosis) (HCC)    Neuromuscular disorder (HCC)    MS    Normal coronary arteries cath 2015   Overactive bladder    PVC's  (premature ventricular contractions) 04/22/2017   SVT (supraventricular tachycardia) (HCC) 04/22/2017   Vitamin D deficiency     Past Medical History, Surgical history, Social history, and Family history were reviewed and updated as appropriate.   Please see review of systems for further details on the patient's review from today.   Objective:   Physical Exam:  LMP 06/28/2008   Physical Exam Constitutional:      General: Sarah Hogan is not in acute distress. Musculoskeletal:        General: No deformity.  Neurological:     Mental Status: Sarah Hogan is alert and oriented to  person, place, and time.     Coordination: Coordination normal.  Psychiatric:        Attention and Perception: Attention and perception normal. Sarah Hogan does not perceive auditory or visual hallucinations.        Mood and Affect: Mood normal. Mood is not anxious or depressed. Affect is not labile, blunt, angry or inappropriate.        Speech: Speech normal.        Behavior: Behavior normal.        Thought Content: Thought content normal. Thought content is not paranoid or delusional. Thought content does not include homicidal or suicidal ideation. Thought content does not include homicidal or suicidal plan.        Cognition and Memory: Cognition and memory normal.        Judgment: Judgment normal.     Comments: Insight intact     Lab Review:     Component Value Date/Time   NA 141 10/22/2022 1146   K 4.5 10/22/2022 1146   CL 104 10/22/2022 1146   CO2 22 10/22/2022 1146   GLUCOSE 89 10/22/2022 1146   GLUCOSE 87 02/26/2017 1126   BUN 15 10/22/2022 1146   CREATININE 1.01 (H) 10/22/2022 1146   CREATININE 1.13 (H) 02/26/2017 1126   CALCIUM 9.8 10/22/2022 1146   PROT 6.2 01/27/2023 1052   ALBUMIN 4.1 01/27/2023 1052   AST 13 01/27/2023 1052   ALT 11 01/27/2023 1052   ALKPHOS 115 01/27/2023 1052   BILITOT 0.3 01/27/2023 1052   GFRNONAA 57 (L) 12/21/2019 1027   GFRAA 66 12/21/2019 1027       Component Value Date/Time    WBC 4.1 03/17/2023 1222   WBC 2.2 (L) 02/26/2017 1126   RBC 4.72 03/17/2023 1222   RBC 4.41 02/26/2017 1126   HGB 12.9 03/17/2023 1222   HCT 40.6 03/17/2023 1222   PLT 189 03/17/2023 1222   MCV 86 03/17/2023 1222   MCH 27.3 03/17/2023 1222   MCH 28.3 02/26/2017 1126   MCHC 31.8 03/17/2023 1222   MCHC 32.7 02/26/2017 1126   RDW 12.6 03/17/2023 1222   LYMPHSABS 1.1 03/17/2023 1222   MONOABS 220 02/26/2017 1126   EOSABS 0.1 03/17/2023 1222   BASOSABS 0.0 03/17/2023 1222    No results found for: "POCLITH", "LITHIUM"   No results found for: "PHENYTOIN", "PHENOBARB", "VALPROATE", "CBMZ"   .res Assessment: Plan:    31 minutes spent dedicated to the care of this patient on the date of this encounter to include pre-visit review of records, ordering of medication, post visit documentation, and face-to-face time with the patient discussing response to [facility] and plan for transition of care since provider will be leaving the practice. Discussed transferring care to Melony Overly, PA.  Pt reports that Sarah Hogan has experienced improved mood and anxiety symptoms since re-starting Latuda and has not experienced worsening tardive dyskinesia. Sarah Hogan reports that Sarah Hogan would like to continue Latuda 40 mg daily with supper for mood symptoms. Continue Trileptal 150 mg twice daily for mood and anxiety symptoms.  Continue Effexor XR 75 mg daily for depression and anxiety.  Sarah Hogan reports that Sarah Hogan is rarely taking Klonopin prn or Modafinil prn and does not need a refill at this time.  Sarah Hogan reports that on rare occasions Sarah Hogan will take trazodone prn for insomnia. Sarah Hogan reports that Sarah Hogan does not need a script for Trazodone.  Pt to follow-up in 3 months or sooner if clinically indicated.  Patient advised to contact office with any questions,  adverse effects, or acute worsening in signs and symptoms.   Sarah Hogan was seen today for follow-up.  Diagnoses and all orders for this visit:  Bipolar 2 disorder (HCC) -      lurasidone (LATUDA) 40 MG TABS tablet; Take 1 tablet (40 mg total) by mouth daily with supper.  Generalized anxiety disorder  Insomnia, unspecified type     Please see After Visit Summary for patient specific instructions.  Future Appointments  Date Time Provider Department Center  09/10/2023 11:30 AM Cherie Ouch, PA-C CP[facility] None  09/22/2023 11:20 AM Quintella Reichert, MD [facility]  10/28/2023 11:15 AM Early, Sung Amabile, NP [facility]    No orders of the defined types were placed in this encounter.   -------------------------------

## 2023-06-10 DIAGNOSIS — M51362 Other intervertebral disc degeneration, lumbar region with discogenic back pain and lower extremity pain: Secondary | ICD-10-CM | POA: Diagnosis not present

## 2023-06-10 DIAGNOSIS — M9903 Segmental and somatic dysfunction of lumbar region: Secondary | ICD-10-CM | POA: Diagnosis not present

## 2023-06-10 DIAGNOSIS — M9902 Segmental and somatic dysfunction of thoracic region: Secondary | ICD-10-CM | POA: Diagnosis not present

## 2023-06-10 DIAGNOSIS — M9901 Segmental and somatic dysfunction of cervical region: Secondary | ICD-10-CM | POA: Diagnosis not present

## 2023-06-10 DIAGNOSIS — M50322 Other cervical disc degeneration at C5-C6 level: Secondary | ICD-10-CM | POA: Diagnosis not present

## 2023-06-12 DIAGNOSIS — M51362 Other intervertebral disc degeneration, lumbar region with discogenic back pain and lower extremity pain: Secondary | ICD-10-CM | POA: Diagnosis not present

## 2023-06-12 DIAGNOSIS — M9903 Segmental and somatic dysfunction of lumbar region: Secondary | ICD-10-CM | POA: Diagnosis not present

## 2023-06-12 DIAGNOSIS — M9901 Segmental and somatic dysfunction of cervical region: Secondary | ICD-10-CM | POA: Diagnosis not present

## 2023-06-12 DIAGNOSIS — M50322 Other cervical disc degeneration at C5-C6 level: Secondary | ICD-10-CM | POA: Diagnosis not present

## 2023-06-12 DIAGNOSIS — M9902 Segmental and somatic dysfunction of thoracic region: Secondary | ICD-10-CM | POA: Diagnosis not present

## 2023-06-13 DIAGNOSIS — R339 Retention of urine, unspecified: Secondary | ICD-10-CM | POA: Diagnosis not present

## 2023-06-23 DIAGNOSIS — M9903 Segmental and somatic dysfunction of lumbar region: Secondary | ICD-10-CM | POA: Diagnosis not present

## 2023-06-23 DIAGNOSIS — M9901 Segmental and somatic dysfunction of cervical region: Secondary | ICD-10-CM | POA: Diagnosis not present

## 2023-06-23 DIAGNOSIS — M9902 Segmental and somatic dysfunction of thoracic region: Secondary | ICD-10-CM | POA: Diagnosis not present

## 2023-06-23 DIAGNOSIS — M50322 Other cervical disc degeneration at C5-C6 level: Secondary | ICD-10-CM | POA: Diagnosis not present

## 2023-06-23 DIAGNOSIS — M51362 Other intervertebral disc degeneration, lumbar region with discogenic back pain and lower extremity pain: Secondary | ICD-10-CM | POA: Diagnosis not present

## 2023-07-01 DIAGNOSIS — M50322 Other cervical disc degeneration at C5-C6 level: Secondary | ICD-10-CM | POA: Diagnosis not present

## 2023-07-01 DIAGNOSIS — M9901 Segmental and somatic dysfunction of cervical region: Secondary | ICD-10-CM | POA: Diagnosis not present

## 2023-07-01 DIAGNOSIS — M9903 Segmental and somatic dysfunction of lumbar region: Secondary | ICD-10-CM | POA: Diagnosis not present

## 2023-07-01 DIAGNOSIS — M9902 Segmental and somatic dysfunction of thoracic region: Secondary | ICD-10-CM | POA: Diagnosis not present

## 2023-07-01 DIAGNOSIS — M51362 Other intervertebral disc degeneration, lumbar region with discogenic back pain and lower extremity pain: Secondary | ICD-10-CM | POA: Diagnosis not present

## 2023-07-09 DIAGNOSIS — M9902 Segmental and somatic dysfunction of thoracic region: Secondary | ICD-10-CM | POA: Diagnosis not present

## 2023-07-09 DIAGNOSIS — M50322 Other cervical disc degeneration at C5-C6 level: Secondary | ICD-10-CM | POA: Diagnosis not present

## 2023-07-09 DIAGNOSIS — M9903 Segmental and somatic dysfunction of lumbar region: Secondary | ICD-10-CM | POA: Diagnosis not present

## 2023-07-09 DIAGNOSIS — M51362 Other intervertebral disc degeneration, lumbar region with discogenic back pain and lower extremity pain: Secondary | ICD-10-CM | POA: Diagnosis not present

## 2023-07-09 DIAGNOSIS — M9901 Segmental and somatic dysfunction of cervical region: Secondary | ICD-10-CM | POA: Diagnosis not present

## 2023-07-11 ENCOUNTER — Encounter: Payer: Self-pay | Admitting: Nurse Practitioner

## 2023-07-11 ENCOUNTER — Ambulatory Visit (INDEPENDENT_AMBULATORY_CARE_PROVIDER_SITE_OTHER): Payer: No Typology Code available for payment source | Admitting: Nurse Practitioner

## 2023-07-11 VITALS — BP 124/78 | HR 64 | Wt 243.2 lb

## 2023-07-11 DIAGNOSIS — M533 Sacrococcygeal disorders, not elsewhere classified: Secondary | ICD-10-CM

## 2023-07-11 MED ORDER — METHYLPREDNISOLONE ACETATE 40 MG/ML IJ SUSP
40.0000 mg | Freq: Once | INTRAMUSCULAR | Status: AC
  Administered 2023-07-11: 40 mg via INTRAMUSCULAR

## 2023-07-11 MED ORDER — KETOROLAC TROMETHAMINE 60 MG/2ML IM SOLN: 60.0000 mg | Freq: Once | INTRAMUSCULAR | Status: DC

## 2023-07-11 MED ORDER — PREDNISONE 50 MG PO TABS: 50.0000 mg | ORAL_TABLET | Freq: Every day | ORAL | 0 refills | Status: DC

## 2023-07-11 MED ORDER — TRIAMCINOLONE ACETONIDE 40 MG/ML IJ SUSP
40.0000 mg | Freq: Once | INTRAMUSCULAR | Status: DC
Start: 2023-07-11 — End: 2023-07-11

## 2023-07-11 MED ORDER — IBUPROFEN 800 MG PO TABS: 800.0000 mg | ORAL_TABLET | Freq: Three times a day (TID) | ORAL | 1 refills | Status: AC | PRN

## 2023-07-11 MED ORDER — METHYLPREDNISOLONE SODIUM SUCC 125 MG IJ SOLR
125.0000 mg | Freq: Once | INTRAMUSCULAR | Status: AC
  Administered 2023-07-11: 125 mg via INTRAMUSCULAR

## 2023-07-11 NOTE — Patient Instructions (Signed)

## 2023-07-11 NOTE — Progress Notes (Unsigned)
  Tollie Eth, DNP, AGNP-c [facility] [address] [place], [place] [address] [phone]   ACUTE VISIT- ESTABLISHED PATIENT  Blood pressure 124/78, pulse 64, weight 243 lb 3.2 oz (110.3 kg), last menstrual period 06/28/2008.  Subjective:  HPI Sarah Hogan is a 62 y.o. female presents to day for evaluation of acute concern(s).     ROS negative except for what is listed in HPI. History, Medications, Surgery, SDOH, and Family History reviewed and updated as appropriate.  Objective:  Physical Exam Neurological:     Gait: Gait is intact.    Spine Musculoskeletal Exam  Gait   Gait is normal.        Assessment & Plan:   Problem List Items Addressed This Visit   None     Tollie Eth, DNP, AGNP-c

## 2023-07-14 DIAGNOSIS — M50322 Other cervical disc degeneration at C5-C6 level: Secondary | ICD-10-CM | POA: Diagnosis not present

## 2023-07-14 DIAGNOSIS — M9902 Segmental and somatic dysfunction of thoracic region: Secondary | ICD-10-CM | POA: Diagnosis not present

## 2023-07-14 DIAGNOSIS — M51362 Other intervertebral disc degeneration, lumbar region with discogenic back pain and lower extremity pain: Secondary | ICD-10-CM | POA: Diagnosis not present

## 2023-07-14 DIAGNOSIS — M9901 Segmental and somatic dysfunction of cervical region: Secondary | ICD-10-CM | POA: Diagnosis not present

## 2023-07-14 DIAGNOSIS — M9903 Segmental and somatic dysfunction of lumbar region: Secondary | ICD-10-CM | POA: Diagnosis not present

## 2023-07-22 DIAGNOSIS — M533 Sacrococcygeal disorders, not elsewhere classified: Secondary | ICD-10-CM | POA: Insufficient documentation

## 2023-07-22 NOTE — Assessment & Plan Note (Signed)
 SI joint dysfunction with inflammation causing pain radiating to hips and thighs, exacerbated by new footwear. Differential includes inflamed disc or hernia. Recent prednisone  course completed three weeks ago with no significant relief. Discussed risks and benefits of steroid and Toradol  injections, including potential for temporary burning sensation and rapid inflammation reduction. Explained that a 4-day course of oral steroids can help reduce inflammation quickly. Emphasized the importance of SI joint stretches. - Administer Kenalog  injection - Administer Toradol  injection - Prescribe 4-day course of oral steroids starting tomorrow - Prescribe high-dose ibuprofen  - Instruct on SI joint stretches to perform at home - Advise to follow up if no improvement by next week

## 2023-07-25 ENCOUNTER — Ambulatory Visit: Payer: No Typology Code available for payment source | Admitting: Nurse Practitioner

## 2023-07-25 ENCOUNTER — Encounter: Payer: Self-pay | Admitting: Nurse Practitioner

## 2023-07-25 VITALS — BP 124/82 | HR 68 | Wt 240.4 lb

## 2023-07-25 DIAGNOSIS — N3090 Cystitis, unspecified without hematuria: Secondary | ICD-10-CM

## 2023-07-25 MED ORDER — PHENAZOPYRIDINE HCL 200 MG PO TABS: 200.0000 mg | ORAL_TABLET | Freq: Three times a day (TID) | ORAL | 0 refills | Status: DC | PRN

## 2023-07-25 MED ORDER — NITROFURANTOIN MONOHYD MACRO 100 MG PO CAPS: 100.0000 mg | ORAL_CAPSULE | Freq: Two times a day (BID) | ORAL | 0 refills | Status: DC

## 2023-07-25 NOTE — Progress Notes (Signed)
 Camie FORBES Doing, DNP, AGNP-c [facility] Medicine [address] [place], [place] [address] [phone]   ACUTE VISIT- ESTABLISHED PATIENT  Blood pressure 124/82, pulse 68, weight 240 lb 6.4 oz (109 kg), last menstrual period 06/28/2008.  Subjective:  HPI Sarah Hogan is a 63 y.o. female presents to day for evaluation of acute concern(s).   History of Present Illness Sarah Hogan, with a history of multiple sclerosis and recurrent urinary tract infections (UTIs), presents with complaints of a burning sensation and urinary urgency that started on New Year's Day. She reports taking over-the-counter azo-type medication for the pain. She also mentions experiencing urinary incontinence, particularly during UTI episodes, which has been a recurring issue since her mid-forties. She notes that this has worsened over the past year, leading to the use of adult diapers.  She also reports a recent increase in body weight, which she believes has contributed to difficulties with personal hygiene. She has initiated a no-sugar diet in an attempt to address this issue.  The patient has been managing her UTIs with a supplement recommended by her urogynecologist, which she believes has been effective. However, she ran out of this supplement two weeks prior to the onset of the current symptoms. She also mentions a history of frequent UTIs in 2024 but has not had one for a while until now.   Her UTIs are typically managed with antibiotics, specifically nitrofurantoin , and she reports a stockpile of over-the-counter medications for pain management (AZO). She does not report any adverse reactions to these medications. She also takes acidophilus to prevent yeast infections, which can be a side effect of antibiotic use.  The patient's MS symptoms and her impact on daily life were also discussed, but no new or worsening symptoms were reported. The patient appears to have a good understanding of her chronic conditions  and is proactive in managing her symptoms.   ROS negative except for what is listed in HPI. History, Medications, Surgery, SDOH, and Family History reviewed and updated as appropriate.  Objective:  Physical Exam Vitals and nursing note reviewed.  Constitutional:      General: She is not in acute distress.    Appearance: Normal appearance. She is not ill-appearing.  HENT:     Head: Normocephalic.  Eyes:     Conjunctiva/sclera: Conjunctivae normal.  Cardiovascular:     Rate and Rhythm: Normal rate and regular rhythm.     Pulses: Normal pulses.     Heart sounds: Normal heart sounds.  Pulmonary:     Effort: Pulmonary effort is normal.     Breath sounds: Normal breath sounds.  Abdominal:     General: Bowel sounds are normal.     Palpations: Abdomen is soft.     Tenderness: There is abdominal tenderness. There is no right CVA tenderness, left CVA tenderness or guarding.  Musculoskeletal:     Right lower leg: No edema.     Left lower leg: No edema.  Skin:    General: Skin is warm and dry.     Capillary Refill: Capillary refill takes less than 2 seconds.  Neurological:     General: No focal deficit present.     Mental Status: She is alert and oriented to person, place, and time.  Psychiatric:        Mood and Affect: Mood normal.        Behavior: Behavior normal.          Assessment & Plan:   Problem List Items Addressed This Visit  Cystitis - Primary   Recurrent UTI with symptoms of dysuria, bladder spasms, and incontinence since January 1. History of multiple UTIs, currently not on pain medication. MS may contribute to bladder control issues. Ran out of bladder acidity supplement two weeks ago. Nitrofurantoin  (Macrobid ) prescribed for its efficacy and low resistance risk. Pyridium  prescribed for pain relief. No history of yeast infections, takes acidophilus to counteract side effects. - Prescribe nitrofurantoin  (Macrobid ) - Prescribe Pyridium  for pain relief - Advise  resuming bladder acidity supplement - Encourage follow-up with new urogynecologist      Relevant Medications   nitrofurantoin , macrocrystal-monohydrate, (MACROBID ) 100 MG capsule   phenazopyridine  (PYRIDIUM ) 200 MG tablet      Camie FORBES Doing, DNP, AGNP-c

## 2023-07-25 NOTE — Patient Instructions (Signed)
 Continue with your increased water intake.I have sent in nitrofurantoin for your to take for 5 days. If your symptoms don't get better with this please let me know.

## 2023-07-30 NOTE — Assessment & Plan Note (Signed)
 Recurrent UTI with symptoms of dysuria, bladder spasms, and incontinence since January 1. History of multiple UTIs, currently not on pain medication. MS may contribute to bladder control issues. Ran out of bladder acidity supplement two weeks ago. Nitrofurantoin  (Macrobid ) prescribed for its efficacy and low resistance risk. Pyridium  prescribed for pain relief. No history of yeast infections, takes acidophilus to counteract side effects. - Prescribe nitrofurantoin  (Macrobid ) - Prescribe Pyridium  for pain relief - Advise resuming bladder acidity supplement - Encourage follow-up with new urogynecologist

## 2023-09-10 ENCOUNTER — Encounter: Payer: Self-pay | Admitting: Physician Assistant

## 2023-09-10 ENCOUNTER — Ambulatory Visit: Payer: No Typology Code available for payment source | Admitting: Physician Assistant

## 2023-09-10 DIAGNOSIS — F411 Generalized anxiety disorder: Secondary | ICD-10-CM

## 2023-09-10 DIAGNOSIS — F3181 Bipolar II disorder: Secondary | ICD-10-CM

## 2023-09-10 DIAGNOSIS — G4700 Insomnia, unspecified: Secondary | ICD-10-CM | POA: Diagnosis not present

## 2023-09-10 MED ORDER — VENLAFAXINE HCL ER 75 MG PO CP24: ORAL_CAPSULE | ORAL | 1 refills | Status: DC

## 2023-09-10 MED ORDER — CLONAZEPAM 0.5 MG PO TABS: 0.5000 mg | ORAL_TABLET | Freq: Every day | ORAL | 0 refills | Status: DC | PRN

## 2023-09-10 MED ORDER — OXCARBAZEPINE 150 MG PO TABS: 150.0000 mg | ORAL_TABLET | Freq: Two times a day (BID) | ORAL | 1 refills | Status: DC

## 2023-09-10 NOTE — Progress Notes (Signed)
[facility]  Patient ID: Sarah Hogan,  MRN: [id number]  PCP: Tollie Eth, NP  Date of Evaluation: 09/10/2023 Time spent:25 minutes  Chief Complaint:  Chief Complaint   Anxiety; Depression; Insomnia; Follow-up     HISTORY/CURRENT STATUS: HPI Transfer from Corie Chiquito, NP who is no longer with our practice.   Sarah Hogan is doing well as far as her mental health goes. She feels like her medications are working well and she doesn't want to change anything. Energy and motivation may vary from day to day, mostly d/t MS. She enjoys being in Bible study with some other ladies. ADLs and personal hygiene are nl.  No extreme sadness, tearfulness, or feelings of hopelessness.  Sleeps well most of the time.  Has memory issues, from the MS, and from aging.  Doesn't feel like it's worse.  Appetite has not changed.  Weight is stable.  Anxiety is controlled with Klonopin.  Denies suicidal or homicidal thoughts.  She joined [employer] again. Has lost a few pounds but gained a couple back this week.  She's done well with her diet this week so not sure what happened. A little discouraged but says she's not going to worry about it. Does water aerobics twice a week.   Review of Systems  Constitutional: Negative.   HENT: Negative.    Eyes: Negative.   Respiratory: Negative.    Cardiovascular: Negative.   Gastrointestinal: Negative.   Genitourinary: Negative.   Musculoskeletal: Negative.   Skin: Negative.   Neurological:        MS is stable  Endo/Heme/Allergies: Negative.   Psychiatric/Behavioral:         See HPI   Individual Medical History/ Review of Systems: Changes? :No  Has long-term MS, since 1990.   Past medications for mental health diagnoses include: [name] been helpful for her depression. Worsening TD on 20 mg.  Latuda- Effective. Took samples.  Seroquel- Took briefly. Had severe fatigue Effexor- Taken for several years Zoloft- Was effective and then stopped  working Prozac Remeron- Lost 80 lbs when she stopped taking it. Buspar Trileptal- Helps stabilize mood. Has taken long-term. Lamictal-Worsening depression Tranxene Provigil Trazodone  Allergies: Amoxicillin and Statins  Current Medications:  Current Outpatient Medications:    AMBULATORY NON FORMULARY MEDICATION, Medication Name: Magic Mouthwash. 1 part viscous lidocaine 2%, 1 part prednisolone 15mg  per 5mL, 1 part Nystatin 100,000 u per 5mL. Swish 5mL up to 4 times a day for oral ulcers. May swallow if throat is involved., Disp: 300 mL, Rfl: 1   baclofen (LIORESAL) 10 MG tablet, Take 10 mg by mouth 2 (two) times daily., Disp: , Rfl:    Biotin 5000 MCG CAPS, Take by mouth., Disp: , Rfl:    cholecalciferol (VITAMIN D3) 25 MCG (1000 UNIT) tablet, Take 1,000 Units by mouth daily., Disp: , Rfl:    Chromium Picolinate (CHROMIUM PICOLATE PO), Take by mouth., Disp: , Rfl:    ezetimibe (ZETIA) 10 MG tablet, Take 1 tablet (10 mg total) by mouth daily., Disp: 90 tablet, Rfl: 1   ibuprofen (ADVIL) 800 MG tablet, Take 1 tablet (800 mg total) by mouth every 8 (eight) hours as needed., Disp: 90 tablet, Rfl: 1   lurasidone (LATUDA) 40 MG TABS tablet, Take 1 tablet (40 mg total) by mouth daily with supper., Disp: 90 tablet, Rfl: 1   metoprolol succinate (TOPROL XL) 50 MG 24 hr tablet, Take 1 tablet (50 mg total) by mouth daily. Take with or immediately following a meal., Disp: 90  tablet, Rfl: 3   modafinil (PROVIGIL) 100 MG tablet, Take 100 mg by mouth daily as needed (Driving)., Disp: , Rfl:    MYRBETRIQ 25 MG TB24 tablet, Take 1 tablet (25 mg total) by mouth daily., Disp: 90 tablet, Rfl: 1   NONFORMULARY OR COMPOUNDED ITEM, Compounded progesterone SR 100mg .  1 capsule daily., Disp: 90 each, Rfl: 3   pregabalin (LYRICA) 50 MG capsule, Take 50 mg by mouth 2 (two) times daily., Disp: , Rfl:    traZODone (DESYREL) 100 MG tablet, Take 100 mg by mouth at bedtime. Takes 1/2-1 tablet at bedtime as needed, Disp:  , Rfl:    clonazePAM (KLONOPIN) 0.5 MG tablet, Take 1 tablet (0.5 mg total) by mouth daily as needed for anxiety., Disp: 30 tablet, Rfl: 0   methenamine (HIPREX) 1 g tablet, Take 1 g by mouth 2 (two) times daily., Disp: , Rfl:    OXcarbazepine (TRILEPTAL) 150 MG tablet, Take 1 tablet (150 mg total) by mouth 2 (two) times daily., Disp: 180 tablet, Rfl: 1   predniSONE (DELTASONE) 50 MG tablet, Take 1 tablet (50 mg total) by mouth daily with breakfast. 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  General Appearance: Casual, Well Groomed, and Obese  Eye Contact:  Good  Speech:  Clear and Coherent and Normal Rate  Volume:  Normal  Mood:  Euthymic  Affect:  Congruent  Thought Process:  Goal Directed and Descriptions of Associations: Circumstantial  Orientation:  Full (Time, Place, and Person)  Thought Content: Logical   Suicidal Thoughts:  No  Homicidal Thoughts:  No  Memory:   fair  Judgement:  Good  Insight:  Good  Psychomotor Activity:  Normal  Concentration:  Concentration: Good  Recall:  Fair  Fund of Knowledge: Good  Language: Good  Assets:  Communication Skills Desire for Improvement Financial Resources/Insurance Social Support Transportation  ADL's:  Intact  Cognition: WNL  Prognosis:  Good   Her PCP follows her labs.  DIAGNOSES:    ICD-10-CM   1. Bipolar 2 disorder (HCC)  F31.81 venlafaxine XR (EFFEXOR-XR) 75 MG 24 hr capsule    OXcarbazepine (TRILEPTAL) 150 MG tablet    2. Generalized anxiety disorder  F41.1 venlafaxine XR (EFFEXOR-XR) 75 MG 24 hr capsule    clonazePAM (KLONOPIN) 0.5 MG tablet     3. Insomnia, unspecified type  G47.00      Receiving Psychotherapy: No   RECOMMENDATIONS:  PDMP reviewed.  Lyrica filled 08/01/2023.  Klonopin filled 02/05/2023. I provided 25 minutes of face to face time during this encounter, including time spent before and after the visit in records review, medical decision making, counseling pertinent to today's visit, and charting.   Sarah Hogan is stable on current meds so no changes will be made.   Continue Klonopin 0.5 mg, 1 p.o. daily as needed anxiety. Continue Latuda 40 mg, 1 p.o. daily with food. Continue modafinil 100 mg, 1 p.o. daily as needed. Continue Trileptal 150 mg, 1 p.o. twice daily. Continue trazodone 100 mg, 1 p.o. nightly as needed. Continue Effexor XR 75 mg, 1 p.o. daily. Return in 3 months.   Melony Overly,  PA-C

## 2023-09-22 ENCOUNTER — Ambulatory Visit: Payer: No Typology Code available for payment source | Attending: Cardiology | Admitting: Cardiology

## 2023-09-22 NOTE — Progress Notes (Deleted)
 Cardiology Office Note:    Date:  09/22/2023   ID:  Sarah Hogan, DOB 01-03-1961, MRN [id number]  PCP:  Tollie Eth, NP  Cardiologist:  Armanda Magic, MD    Referring MD: Tollie Eth, NP   No chief complaint on file.   History of Present Illness:    Sarah Hogan is a 63 y.o. female with a hx of hyperlipidemia, MS, GERD, migraines, bipolar disorder, tobacco abuse with 40 pk year history of smoking and normal coronary arteries by cath for positive stress test done for fatigue in 2015.  She has a history SVT with PACs and PVCs on event monitor. Her echo 08/2016 showed normal LVF with ER 65-70% with mild AR and MR.  She is here today for followup and is doing well.  She denies any chest pain or pressure, SOB, DOE, PND, orthopnea, LE edema, dizziness, palpitations or syncope. She is compliant with her meds and is tolerating meds with no SE.    Past Medical History:  Diagnosis Date   Allergy    Anxiety    Bipolar 2 disorder (HCC)    Cervical polyp 12/16/2018   Depression    Former smoker 12/16/2018   Hyperlipidemia    Leukopenia    Mild aortic insufficiency    mild by echo 09/2022   Mild mitral regurgitation    by echo 07/2016   MS (multiple sclerosis) (HCC)    Neuromuscular disorder (HCC)    MS    Normal coronary arteries cath 2015   Overactive bladder    PVC's (premature ventricular contractions) 04/09/2016   SVT (supraventricular tachycardia) (HCC) 04/22/2017   Vitamin D deficiency     Past Surgical History:  Procedure Laterality Date   BREAST BIOPSY Left 2002   CARPAL TUNNEL RELEASE     CHOLECYSTECTOMY     COLONOSCOPY  2010   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     Allergies:    Amoxicillin and Statins   Social History   Socioeconomic History   Marital status: Single    Spouse name: Not on file   Number of children: Not on file   Years of education: Not on file   Highest education level: Not on file  Occupational History   Not on file  Tobacco Use   Smoking status: Former    Current packs/day: 0.00    Average packs/day: 1.5 packs/day for 20.0 years (30.0 ttl pk-yrs)    Types: Cigarettes    Start date: 10/28/1974    Quit date: 10/28/1994    Years since quitting: 28.9   Smokeless tobacco: Never  Vaping Use   Vaping status: Never Used  Substance and Sexual Activity   Alcohol use: Yes    Alcohol/week: 0.0 - 1.0 standard drinks of alcohol    Comment: occ   Drug use: No   Sexual activity: Not Currently    Comment: since 2008  Other Topics Concern   Not on file  Social History Narrative   Not on file   Social Drivers of Health   Financial Resource Strain: Low Risk  (10/23/2022)   Overall Financial Resource Strain (CARDIA)    Difficulty of Paying Living  Expenses: Not hard at all  Food Insecurity: No Food Insecurity (10/23/2022)   Hunger Vital Sign    Worried About Running Out of Food in the Last Year: Never true    Ran Out of Food in the Last Year: Never true  Transportation Needs: No Transportation Needs (10/23/2022)   PRAPARE - Administrator, Civil Service (Medical): No    Lack of Transportation (Non-Medical): No  Physical Activity: Insufficiently Active (10/23/2022)   Exercise Vital Sign    Days of Exercise per Week: 2 days    Minutes of Exercise per Session: 10 min  Stress: No Stress Concern Present (10/23/2022)   [employer] of Occupational Health - Occupational Stress Questionnaire    Feeling of Stress : Not at all  Social Connections: Moderately Integrated (10/23/2022)   Social Connection and Isolation Panel [NHANES]    Frequency of Communication with Friends and Family: Three times a week    Frequency of Social Gatherings with  Friends and Family: Not on file    Attends Religious Services: More than 4 times per year    Active Member of [employer] or Organizations: Yes    Attends Engineer, structural: More than 4 times per year    Marital Status: Never married     Family History: The patient's family history includes Bipolar disorder in her mother; Breast cancer in her paternal grandmother; Colon polyps in her brother; Dementia in her mother; Hyperlipidemia in her brother; Hypertension in her brother; Obesity in her brother; Skin cancer in her paternal grandmother; Stroke (age of onset: 5) in her mother. There is no history of Colon cancer, Esophageal cancer, Rectal cancer, or Stomach cancer.  ROS:   Please see the history of present illness.    ROS  All other systems reviewed and negative.   EKGs/Labs/Other Studies Reviewed:    The following studies were reviewed today: none   Recent Labs: 10/22/2022: BUN 15; Creatinine, Ser 1.01; Potassium 4.5; Sodium 141; TSH 1.630 01/27/2023: ALT 11 03/17/2023: Hemoglobin 12.9; Platelets 189   Recent Lipid Panel    Component Value Date/Time   CHOL 173 01/27/2023 1052   TRIG 130 01/27/2023 1052   HDL 50 01/27/2023 1052   CHOLHDL 3.5 01/27/2023 1052   CHOLHDL 4.2 02/26/2017 1126   VLDL 38 (H) 02/26/2017 1126   LDLCALC 100 (H) 01/27/2023 1052    Physical Exam:    VS:  LMP 06/28/2008     Wt Readings from Last 3 Encounters:  07/25/23 240 lb 6.4 oz (109 kg)  07/11/23 243 lb 3.2 oz (110.3 kg)  03/17/23 218 lb 3.2 oz (99 kg)    GEN: Well nourished, well developed in no acute distress HEENT: Normal NECK: No JVD; No carotid bruits LYMPHATICS: No lymphadenopathy CARDIAC:RRR, no murmurs, rubs, gallops RESPIRATORY:  Clear to auscultation without rales, wheezing or rhonchi  ABDOMEN: Soft, non-tender, non-distended MUSCULOSKELETAL:  No edema; No deformity  SKIN: Warm and dry NEUROLOGIC:  Alert and oriented x 3 PSYCHIATRIC:  Normal affect  ASSESSMENT:    1.  SVT (supraventricular tachycardia) (HCC)   2. PVC's (premature ventricular contractions)   3. Mild aortic insufficiency   4. Hyperlipidemia, unspecified hyperlipidemia type     PLAN:    In order of problems listed above:  1.  SVT -Heart monitor 10/09/2022 showed several runs of SVT consistent with PAT lasting up to 10 beats in a row as well as rare PVCs  -Palpitations well-controlled on beta-blocker therapy -Continue prescription drug management with  Toprol-XL 25 mg daily with as needed refills  2.  PVCs -Continue Toprol-XL 25 mg daily  3.  Mitral and aortic regurgitation -echo 10/09/2022 with mild AI and trivial MR  4.  HLD -followed by PCP -She is statin intolerant -Continue prescription drug management Zetia 10 mg daily -Check FLP  -I have personally reviewed and interpreted outside labs performed by patient's PCP which showed ALT 18 on 08/21/2023  Medication Adjustments/Labs and Tests Ordered: Current medicines are reviewed at length with the patient today.  Concerns regarding medicines are outlined above.  No orders of the defined types were placed in this encounter.  No orders of the defined types were placed in this encounter.   Signed, Armanda Magic, MD  09/22/2023 9:34 AM    [facility]

## 2023-10-01 NOTE — Progress Notes (Deleted)
 Cardiology Office Note:    Date:  10/01/2023   ID:  Sarah Hogan, DOB 08/20/1960, MRN [id number]  PCP:  Tollie Eth, NP  Cardiologist:  Armanda Magic, MD    Referring MD: Tollie Eth, NP   No chief complaint on file.   History of Present Illness:    Sarah Hogan is a 63 y.o. female with a hx of hyperlipidemia, MS, GERD, migraines, bipolar disorder, tobacco abuse with 40 pk year history of smoking and normal coronary arteries by cath for positive stress test done for fatigue in 2015.  She has a history SVT with PACs and PVCs on event monitor. Her echo 08/2016 showed normal LVF with ER 65-70% with mild AR and MR.  She is here today for followup and is doing well.  She denies any chest pain or pressure, SOB, DOE, PND, orthopnea, LE edema, dizziness, palpitations or syncope. She is compliant with her meds and is tolerating meds with no SE.    Past Medical History:  Diagnosis Date   Allergy    Anxiety    Bipolar 2 disorder (HCC)    Cervical polyp 12/16/2018   Depression    Former smoker 12/16/2018   Hyperlipidemia    Leukopenia    Mild aortic insufficiency    mild by echo 09/2022   Mild mitral regurgitation    by echo 07/2016   MS (multiple sclerosis) (HCC)    Neuromuscular disorder (HCC)    MS    Normal coronary arteries cath 2015   Overactive bladder    PVC's (premature ventricular contractions) 04/09/2016   SVT (supraventricular tachycardia) (HCC) 04/22/2017   Vitamin D deficiency     Past Surgical History:  Procedure Laterality Date   BREAST BIOPSY Left 2002   CARPAL TUNNEL RELEASE     CHOLECYSTECTOMY     COLONOSCOPY  2010   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     pigmented squamous cell carcinoma removal     POLYPECTOMY  2010   1 polyp- rectum , no path    UPPER GASTROINTESTINAL ENDOSCOPY  2010    Current Medications: No outpatient medications have been marked as taking for the 10/02/23 encounter (Appointment) with Quintella Reichert, MD.     Allergies:    Amoxicillin and Statins   Social History   Socioeconomic History   Marital status: Single    Spouse name: Not on file   Number of children: Not on file   Years of education: Not on file   Highest education level: Not on file  Occupational History   Not on file  Tobacco Use   Smoking status: Former    Current packs/day: 0.00    Average packs/day: 1.5 packs/day for 20.0 years (30.0 ttl pk-yrs)    Types: Cigarettes    Start date: 10/28/1974    Quit date: 10/28/1994    Years since quitting: 28.9   Smokeless tobacco: Never  Vaping Use   Vaping status: Never Used  Substance and Sexual Activity   Alcohol use: Yes    Alcohol/week: 0.0 - 1.0 standard drinks of alcohol    Comment: occ   Drug use: No   Sexual activity: Not Currently    Comment: since 2008  Other Topics Concern   Not on file  Social History Narrative   Not on file   Social Drivers of Health   Financial Resource Strain: Low Risk  (10/23/2022)   Overall Financial Resource Strain (CARDIA)    Difficulty of Paying Living  Expenses: Not hard at all  Food Insecurity: No Food Insecurity (10/23/2022)   Hunger Vital Sign    Worried About Running Out of Food in the Last Year: Never true    Ran Out of Food in the Last Year: Never true  Transportation Needs: No Transportation Needs (10/23/2022)   PRAPARE - Administrator, Civil Service (Medical): No    Lack of Transportation (Non-Medical): No  Physical Activity: Insufficiently Active (10/23/2022)   Exercise Vital Sign    Days of Exercise per Week: 2 days    Minutes of Exercise per Session: 10 min  Stress: No Stress Concern Present (10/23/2022)   [employer] of Occupational Health - Occupational Stress Questionnaire    Feeling of Stress : Not at all  Social Connections: Moderately Integrated (10/23/2022)   Social Connection and Isolation Panel [NHANES]    Frequency of Communication with Friends and Family: Three times a week    Frequency of Social Gatherings with  Friends and Family: Not on file    Attends Religious Services: More than 4 times per year    Active Member of [employer] or Organizations: Yes    Attends Engineer, structural: More than 4 times per year    Marital Status: Never married     Family History: The patient's family history includes Bipolar disorder in her mother; Breast cancer in her paternal grandmother; Colon polyps in her brother; Dementia in her mother; Hyperlipidemia in her brother; Hypertension in her brother; Obesity in her brother; Skin cancer in her paternal grandmother; Stroke (age of onset: 31) in her mother. There is no history of Colon cancer, Esophageal cancer, Rectal cancer, or Stomach cancer.  ROS:   Please see the history of present illness.    ROS  All other systems reviewed and negative.   EKGs/Labs/Other Studies Reviewed:    The following studies were reviewed today: none   Recent Labs: 10/22/2022: BUN 15; Creatinine, Ser 1.01; Potassium 4.5; Sodium 141; TSH 1.630 01/27/2023: ALT 11 03/17/2023: Hemoglobin 12.9; Platelets 189   Recent Lipid Panel    Component Value Date/Time   CHOL 173 01/27/2023 1052   TRIG 130 01/27/2023 1052   HDL 50 01/27/2023 1052   CHOLHDL 3.5 01/27/2023 1052   CHOLHDL 4.2 02/26/2017 1126   VLDL 38 (H) 02/26/2017 1126   LDLCALC 100 (H) 01/27/2023 1052    Physical Exam:    VS:  LMP 06/28/2008     Wt Readings from Last 3 Encounters:  07/25/23 240 lb 6.4 oz (109 kg)  07/11/23 243 lb 3.2 oz (110.3 kg)  03/17/23 218 lb 3.2 oz (99 kg)    GEN: Well nourished, well developed in no acute distress HEENT: Normal NECK: No JVD; No carotid bruits LYMPHATICS: No lymphadenopathy CARDIAC:RRR, no murmurs, rubs, gallops RESPIRATORY:  Clear to auscultation without rales, wheezing or rhonchi  ABDOMEN: Soft, non-tender, non-distended MUSCULOSKELETAL:  No edema; No deformity  SKIN: Warm and dry NEUROLOGIC:  Alert and oriented x 3 PSYCHIATRIC:  Normal affect  ASSESSMENT:    1.  SVT (supraventricular tachycardia) (HCC)   2. PVC's (premature ventricular contractions)   3. Mild aortic insufficiency   4. Mixed hyperlipidemia      PLAN:    In order of problems listed above:  1.  SVT -Heart monitor 10/09/2022 showed several runs of SVT consistent with PAT lasting up to 10 beats in a row as well as rare PVCs  -Palpitations well-controlled on beta-blocker therapy -Continue prescription drug management with Toprol-XL  25 mg daily with as needed refills  2.  PVCs -Continue Toprol-XL 25 mg daily  3.  Mitral and aortic regurgitation -echo 10/09/2022 with mild AI and trivial MR  4.  HLD -followed by PCP -She is statin intolerant -Continue prescription drug management Zetia 10 mg daily -Check FLP  -I have personally reviewed and interpreted outside labs performed by patient's PCP which showed ALT 18 on 08/21/2023  Medication Adjustments/Labs and Tests Ordered: Current medicines are reviewed at length with the patient today.  Concerns regarding medicines are outlined above.  No orders of the defined types were placed in this encounter.  No orders of the defined types were placed in this encounter.   Signed, Armanda Magic, MD  10/01/2023 6:38 PM    [facility]

## 2023-10-02 ENCOUNTER — Ambulatory Visit: Admitting: Cardiology

## 2023-10-02 DIAGNOSIS — I493 Ventricular premature depolarization: Secondary | ICD-10-CM

## 2023-10-02 DIAGNOSIS — E782 Mixed hyperlipidemia: Secondary | ICD-10-CM

## 2023-10-02 DIAGNOSIS — I351 Nonrheumatic aortic (valve) insufficiency: Secondary | ICD-10-CM

## 2023-10-02 DIAGNOSIS — I471 Supraventricular tachycardia, unspecified: Secondary | ICD-10-CM

## 2023-10-14 ENCOUNTER — Encounter: Payer: Self-pay | Admitting: Obstetrics

## 2023-10-14 ENCOUNTER — Other Ambulatory Visit (HOSPITAL_COMMUNITY)
Admission: RE | Admit: 2023-10-14 | Discharge: 2023-10-14 | Disposition: A | Discharge status: Home / Self Care | Attending: Obstetrics | Admitting: Obstetrics

## 2023-10-14 ENCOUNTER — Ambulatory Visit: Payer: No Typology Code available for payment source | Admitting: Obstetrics

## 2023-10-14 VITALS — BP 101/62 | HR 63 | Ht 66.0 in | Wt 229.0 lb

## 2023-10-14 DIAGNOSIS — R7989 Other specified abnormal findings of blood chemistry: Secondary | ICD-10-CM | POA: Diagnosis not present

## 2023-10-14 DIAGNOSIS — R351 Nocturia: Secondary | ICD-10-CM | POA: Insufficient documentation

## 2023-10-14 DIAGNOSIS — K5901 Slow transit constipation: Secondary | ICD-10-CM | POA: Diagnosis not present

## 2023-10-14 DIAGNOSIS — Z87440 Personal history of urinary (tract) infections: Secondary | ICD-10-CM

## 2023-10-14 DIAGNOSIS — G35 Multiple sclerosis: Secondary | ICD-10-CM

## 2023-10-14 DIAGNOSIS — N3946 Mixed incontinence: Secondary | ICD-10-CM

## 2023-10-14 DIAGNOSIS — R8290 Unspecified abnormal findings in urine: Secondary | ICD-10-CM | POA: Insufficient documentation

## 2023-10-14 DIAGNOSIS — R159 Full incontinence of feces: Secondary | ICD-10-CM

## 2023-10-14 DIAGNOSIS — N390 Urinary tract infection, site not specified: Secondary | ICD-10-CM | POA: Insufficient documentation

## 2023-10-14 DIAGNOSIS — M6289 Other specified disorders of muscle: Secondary | ICD-10-CM | POA: Diagnosis not present

## 2023-10-14 LAB — URINALYSIS, ROUTINE W REFLEX MICROSCOPIC
Bacteria, UA: NONE SEEN
Bilirubin Urine: NEGATIVE
Glucose, UA: NEGATIVE mg/dL
Ketones, ur: NEGATIVE mg/dL
Leukocytes,Ua: NEGATIVE
Nitrite: NEGATIVE
Protein, ur: NEGATIVE mg/dL
Specific Gravity, Urine: 1.019 (ref 1.005–1.030)
pH: 7 (ref 5.0–8.0)

## 2023-10-14 LAB — POCT URINALYSIS DIPSTICK
Bilirubin, UA: NEGATIVE
Glucose, UA: NEGATIVE
Ketones, UA: NEGATIVE
Leukocytes, UA: NEGATIVE
Nitrite, UA: NEGATIVE
Protein, UA: NEGATIVE
Spec Grav, UA: 1.02 (ref 1.010–1.025)
Urobilinogen, UA: 0.2 U/dL
pH, UA: 7 (ref 5.0–8.0)

## 2023-10-14 MED ORDER — GEMTESA 75 MG PO TABS: 75.0000 mg | ORAL_TABLET | Freq: Every day | ORAL | Status: DC

## 2023-10-14 MED ORDER — LIDOCAINE 5 % EX OINT: TOPICAL_OINTMENT | CUTANEOUS | 0 refills | Status: DC

## 2023-10-14 MED ORDER — ESTRADIOL 0.1 MG/GM VA CREA: TOPICAL_CREAM | VAGINAL | 3 refills | Status: DC

## 2023-10-14 MED ORDER — GEMTESA 75 MG PO TABS: 75.0000 mg | ORAL_TABLET | Freq: Every day | ORAL | 2 refills | Status: DC

## 2023-10-14 NOTE — Assessment & Plan Note (Addendum)
-   prior pelvic floor PT with Kegel exercises and reported increased discomfort and frequency of UTIs - referral sent to pelvic floor PT and discussed need for pelvic floor relaxation and coordination due to pain and discomfort reproduced on pelvic exam - The origin of pelvic floor muscle spasm can be multifactorial, including primary, reactive to a different pain source, trauma, or even part of a centralized pain syndrome.Treatment options include pelvic floor physical therapy, local (vaginal) or oral  muscle relaxants, pelvic muscle trigger point injections or centrally acting pain medications.   - provided samples of vaginal lubrication with topical lidocaine for comfort if patient desires to resume vaginal dilator use - Rx sent for topical lidocaine

## 2023-10-14 NOTE — Assessment & Plan Note (Addendum)
-   likely neurogenic bladder and increased risk of refractory symptoms due to MS and peripheral neuropathy - tried pelvic floor PT, Oxybutynin, mirabegron 25mg , and possible PTNS in the past - repeat UDS to reassess bladder pressures, compliance and bladder emptying We discussed the symptoms of overactive bladder (OAB), which include urinary urgency, urinary frequency, nocturia, with or without urge incontinence.  While we do not know the exact etiology of OAB, several treatment options exist. We discussed management including behavioral therapy (decreasing bladder irritants, urge suppression strategies, timed voids, bladder retraining), physical therapy, medication; for refractory cases posterior tibial nerve stimulation, sacral neuromodulation, and intravesical botulinum toxin injection.  For anticholinergic medications, we discussed the potential side effects of anticholinergics including dry eyes, dry mouth, constipation, cognitive impairment and urinary retention. For Beta-3 agonist medication, we discussed the potential side effect of elevated blood pressure which is more likely to occur in individuals with uncontrolled hypertension. - change from mirabegron 25mg  ([facility]), explained that it has to be used daily to be effective - samples and Rx provided for Gemtesa, if cost prohibitive changed back to mirabegron 50mg  - failed Oxybutynin XL 10mg  in the past - encouraged behavioral modification and weight reduction at [facility]

## 2023-10-14 NOTE — Assessment & Plan Note (Addendum)
-   For treatment of recurrent urinary tract infections, we discussed management of recurrent UTIs including prophylaxis with a daily low dose antibiotic, transvaginal estrogen therapy, D-mannose, and cranberry supplements.  We discussed the role of diagnostic testing such as cystoscopy and upper tract imaging.   - patient to resume low dose vaginal estrogen - denies pyelonephritis or history of kidney stone - pending renal US

## 2023-10-14 NOTE — Assessment & Plan Note (Signed)
-   POCT UA + heme, catheterized urine microscopy pending - denies gross hematuria outside of UTI

## 2023-10-14 NOTE — Assessment & Plan Note (Addendum)
-   since 20s - discussed association with pelvic floor disorders and urinary incontinence - For constipation, we reviewed the importance of a better bowel regimen.  We also discussed the importance of avoiding chronic straining, as it can exacerbate her pelvic floor symptoms; we discussed treating constipation and straining prior to surgery, as postoperative straining can lead to damage to the repair and recurrence of symptoms. We discussed initiating therapy with increasing fluid intake, fiber supplementation, stool softeners, and laxatives such as miralax.  - start slow titration of miralax and squatting position for pelvic floor relaxation during defecation - prior use of fiber supplementation with bloating

## 2023-10-14 NOTE — Assessment & Plan Note (Signed)
-   discussed need for annual Cr in the setting of MS and neurogenic bladder - last Cr 1.01 on 10/22/22, pending repeat with PCP in 10/2023

## 2023-10-14 NOTE — Assessment & Plan Note (Addendum)
-   discussed risk of neurogenic bladder and refractory OAB symptoms - discussed need for repeat UDS to reassess compliance and bladder pressures due to change in lower urinary tract symptoms in 2024 with recurrent UTI to better risk stratify and determine frequency of renal imaging needed - ordered renal US to r/o hydronephrosis, patient to schedule  - UDS in 2019 with increased sensation, decreased MCC, DOI and no SUI. Elevated Pdet at Qmax 36 and low flow with Qmax at 6 after removal of catheter. PVR 74mL with catheter removed. 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

## 2023-10-14 NOTE — Patient Instructions (Addendum)
 We discussed the symptoms of overactive bladder (OAB), which include urinary urgency, urinary frequency, night-time urination, with or without urge incontinence.  We discussed management including behavioral therapy (decreasing bladder irritants by following a bladder diet, urge suppression strategies, timed voids, bladder retraining), physical therapy, medication; and for refractory cases posterior tibial nerve stimulation, sacral neuromodulation, and intravesical botulinum toxin injection.   For Beta-3 agonist medication, we discussed the potential side effect of elevated blood pressure which is more likely to occur in individuals with uncontrolled hypertension. You were given samples for Gemtesa 75 mg.  It can take a month to start working so give it time, but if you have bothersome side effects call sooner and we can try a different medication.  Call us if you have trouble filling the prescription or if it's not covered by your insurance.  Stop mirabegron 25mg , if gemtesa is too expensive we can increase mirabegron to 50mg .   For treatment of recurrent urinary tract infections, we discussed management of recurrent UTIs including prophylaxis with a daily low dose antibiotic, transvaginal estrogen therapy, D-mannose, and cranberry supplements.  We discussed the role of diagnostic testing such as cystoscopy and upper tract imaging.     For vaginal atrophy (thinning of the vaginal tissue that can cause dryness and burning) and UTI prevention we discussed estrogen replacement in the form of vaginal cream.   Start vaginal estrogen therapy nightly for two weeks then 2 times weekly at night. This can be placed with your finger or an applicator inside the vagina and around the urethra.  Please let us know if the prescription is too expensive and we can look for alternative options.   Is vaginal estrogen therapy safe for me? Vaginal estrogen preparations act on the vaginal skin, and only a very tiny amount  is absorbed into the bloodstream (0.01%).  They work in a similar way to hand or face cream.  There is minimal absorption and they are therefore perfectly safe. If you have had breast cancer and have persistent troublesome symptoms which aren't settling with vaginal moisturisers and lubricants, local estrogen treatment may be a possibility, but consultation with your oncologist should take place first.   For treatment of stress urinary incontinence, which is leakage with physical activity/movement/strainging/coughing, we discussed expectant management versus nonsurgical options versus surgery. Nonsurgical options include weight loss, physical therapy, as well as a pessary.  Surgical options include a midurethral sling, which is a synthetic mesh sling that acts like a hammock under the urethra to prevent leakage of urine, a Burch urethropexy, and transurethral injection of a bulking agent.   For night time frequency: - avoid fluid intake 3 hours before bedtime  Constipation: Our goal is to achieve formed bowel movements daily or every-other-day.  You may need to try different combinations of the following options to find what works best for you - everybody's body works differently so feel free to adjust the dosages as needed.  Some options to help maintain bowel health include:  Dietary changes (more leafy greens, vegetables and fruits; less processed foods) Fiber supplementation (Benefiber, FiberCon, Metamucil or Psyllium). Start slow and increase gradually to full dose. Over-the-counter agents such as: stool softeners (Docusate or Colace) and/or laxatives (Miralax, milk of magnesia)  "Power Pudding" is a natural mixture that may help your constipation.  To make blend 1 cup applesauce, 1 cup wheat bran, and 3/4 cup prune juice, refrigerate and then take 1 tablespoon daily with a large glass of water as needed.  Women should try to eat at least 21 to 25 grams of fiber a day, while men should aim for 30  to 38 grams a day. You can add fiber to your diet with food or a fiber supplement such as psyllium (metamucil), benefiber, or fibercon.   Here's a look at how much dietary fiber is found in some common foods. When buying packaged foods, check the Nutrition Facts label for fiber content. It can vary among brands.  Fruits Serving size Total fiber (grams)*  Raspberries 1 cup 8.0  Pear 1 medium 5.5  Apple, with skin 1 medium 4.5  Banana 1 medium 3.0  Orange 1 medium 3.0  Strawberries 1 cup 3.0   Vegetables Serving size Total fiber (grams)*  Green peas, boiled 1 cup 9.0  Broccoli, boiled 1 cup chopped 5.0  Turnip greens, boiled 1 cup 5.0  Brussels sprouts, boiled 1 cup 4.0  Potato, with skin, baked 1 medium 4.0  Sweet corn, boiled 1 cup 3.5  Cauliflower, raw 1 cup chopped 2.0  Carrot, raw 1 medium 1.5   Grains Serving size Total fiber (grams)*  Spaghetti, whole-wheat, cooked 1 cup 6.0  Barley, pearled, cooked 1 cup 6.0  Bran flakes 3/4 cup 5.5  Quinoa, cooked 1 cup 5.0  Oat bran muffin 1 medium 5.0  Oatmeal, instant, cooked 1 cup 5.0  Popcorn, air-popped 3 cups 3.5  Brown rice, cooked 1 cup 3.5  Bread, whole-wheat 1 slice 2.0  Bread, rye 1 slice 2.0   Legumes, nuts and seeds Serving size Total fiber (grams)*  Split peas, boiled 1 cup 16.0  Lentils, boiled 1 cup 15.5  Black beans, boiled 1 cup 15.0  Baked beans, canned 1 cup 10.0  Chia seeds 1 ounce 10.0  Almonds 1 ounce (23 nuts) 3.5  Pistachios 1 ounce (49 nuts) 3.0  Sunflower kernels 1 ounce 3.0  *Rounded to nearest 0.5 gram. Source: [employer] for Standard Reference, Legacy Release    The origin of pelvic floor muscle spasm can be multifactorial, including primary, reactive to a different pain source, trauma, or even part of a centralized pain syndrome.Treatment options include pelvic floor physical therapy, local (vaginal) or oral  muscle relaxants, pelvic muscle trigger point injections or centrally  acting pain medications.     Please call [phone] to schedule the earliest appointment for pelvic floor PT.

## 2023-10-14 NOTE — Progress Notes (Signed)
 New Patient Evaluation and Consultation  Referring Provider: Early, Sung Amabile, NP PCP: Tollie Eth, NP Date of Service: 10/14/2023  SUBJECTIVE Chief Complaint: No chief complaint on file.  History of Present Illness: Sarah Hogan is a 63 y.o. White or Caucasian female seen in consultation at the request of NP Early for evaluation of neurogenic bladder.    Previously managed by Dr. Vernon Prey urogynecology since 2015 Tried pelvic floor PT with mostly Kegel's exercises with increased UTI symptoms, OAB medications with Oxybutynin XL 10mg  in the past. Reports possible PTNS with acupuncture needle use in the past. Denies starting vaginal estrogen.  OAB on mirabegron 25mg , prior use every other day and recently daily with some relief. Multiple sclerosis, taught CIC and recommended follow-up in 4 weeks. Last visit 05/26/23 with PVR 0mL. Prior PVR on 04/28/23 Previously declined Botox or SNM, or 50mg  mirabegron ($260/3 months)  UDS 07/06/18: Unable to void for uroflow "Storage Study: The first urge to void occurred at 76cc, the normal urge to void occurred at 150cc, a strong urge to void occurred at 228cc, and the cystometric capacity was 258cc. One Uninhibited contraction with a large amount of associated leakage occurred at capacity.  Filling Compliance was normal  No prolapse visualized. Stress test done at 151cc. No leakage noted with cough or valsalva maneuver during stress testing.  Pressure Flow Study:  Pressure flow study was attempted. Pt voided 88.1cc with procedural catheters in place. Maximum detrusor pressure was 40cm. Detrusor at maximum flow was 36cm. Detrusor of bladder contraction was sustained. Voiding was not Valsalva assisted.  The maximum flow rate was 5.7cc/sec.   EMG Activity: EMG Activity was Normal  Post Procedure Uroflow:  Patient voided 105cc. Peak urinary flow rate was: 9.7cc/sec.  The pattern was: abnormal PVR= 74cc"  UTI prior use of nitrofurantoin and  pyridium with acidophilus Hiprex for suppression by urology caused mouth ulcers per patient, discussed vaginal estrogen, Cranberry extract 200 mg daily, Vitamin C 1000 mg daily, D-mannose 1000 mg daily Denies kidney infection or kidney stones Difficulty with hygiene  Review of records significant for: Bipolar II disorder, cervical polyp, abnormal Cr, former tobacco use, migraine, aortic insufficiency, SVT, DM  Lyrica for RLE peripheral neuropathy. Last HbA1C 5.4 on 10/22/22.  SI joint pain  Urinary Symptoms: Leaks urine with with a full bladder, with movement to the bathroom, and with urgency since 63yo  Leaks 2 time(s) per week since mirabegron 25mg  every 2 days until 1 month ago with daily use. More bothersome Leaks 2-3x/day with sneezing started 1 month ago Pad use: 1 pad per day.   Patient is bothered by UI symptoms.  Day time voids 10-12x/day.  Nocturia: 5 times per night to void. Denies snoring or OSA diagnosis Denies LE edema Stops drinks water around 6pm and sleeps around 8pm  Voiding dysfunction:  empties bladder well.  Patient does not use a catheter to empty bladder.  When urinating, patient feels the need to urinate multiple times in a row Drinks: 48oz water per day, 1 cup of coffee, intermittent herbal tea/Lacroix   UTIs: 6 UTI's in the last year.   UTI symptoms with blood in urine, dysuria, resolves with antibiotics.  Denies history of blood in urine, kidney or bladder stones, pyelonephritis, bladder cancer, and kidney cancer No results found for the last 90 days.   Pelvic Organ Prolapse Symptoms:                  Patient Denies a feeling of a  bulge the vaginal area.   Bowel Symptom: Bowel movements: 1-3 time(s) per week with slow transit constipation for as long as long as she can recall in early 20s Stool consistency: hard, Bristol III-IV since she used laxative last night with pending weight watchers visit today Straining: no.  Splinting: no.  Incomplete  evacuation: no.  Patient Admits to accidental bowel leakage / fecal incontinence  Occurs: 1-2 time(s) per year  Consistency with leakage: soft  Bowel regimen: diet and oatmeal  Tried metamucil 1 teaspoon/day with bloating, miralax 1 packet with bloating Last colonoscopy: Results polypectomy, internal hemorrhoids, diverticula.  HM Colonoscopy          Upcoming     Colonoscopy (Every 10 Years) Next due on 02/15/2030    02/16/2020  COLONOSCOPY   Only the first 1 history entries have been loaded, but more history exists.                Sexual Function Sexually active: no.  Sexual orientation: Straight Pain with sex: No  Pelvic Pain Denies pelvic pain   Past Medical History:  Past Medical History:  Diagnosis Date   Allergy    Anxiety    Bipolar 2 disorder (HCC)    Cervical polyp 12/16/2018   Depression    Former smoker 12/16/2018   Hyperlipidemia    Leukopenia    Mild aortic insufficiency    mild by echo 09/2022   Mild mitral regurgitation    by echo 07/2016   MS (multiple sclerosis) (HCC)    Neuromuscular disorder (HCC)    MS    Normal coronary arteries cath 2015   Overactive bladder    PVC's (premature ventricular contractions) 04/09/2016   SVT (supraventricular tachycardia) (HCC) 04/22/2017   Vitamin D deficiency      Past Surgical History:   Past Surgical History:  Procedure Laterality Date   BREAST BIOPSY Left 2002   CARPAL TUNNEL RELEASE     CHOLECYSTECTOMY     COLONOSCOPY  2010   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     pigmented squamous cell carcinoma removal     POLYPECTOMY  2010   1 polyp- rectum , no path    UPPER GASTROINTESTINAL ENDOSCOPY  2010     Past OB/GYN History: OB History  Gravida Para Term Preterm AB Living  0 0 0 0 0 0  SAB IAB Ectopic Multiple Live Births  0 0 0 0 0    Menopausal: Yes, at age 64, Denies vaginal bleeding since menopause Contraception: s/p menopause. Last pap smear.  Any history of abnormal pap  smears: no.    Component Value Date/Time   DIAGPAP  02/12/2021 1626    - Negative for intraepithelial lesion or malignancy (NILM)   DIAGPAP  01/12/2019 0000    NEGATIVE FOR INTRAEPITHELIAL LESIONS OR MALIGNANCY.   ADEQPAP  02/12/2021 1626    Satisfactory for evaluation; transformation zone component PRESENT.   ADEQPAP  01/12/2019 0000    Satisfactory for evaluation  endocervical/transformation zone component PRESENT.    Medications: Patient has a current medication list which includes the following prescription(s): baclofen, biotin, cholecalciferol, chromium picolinate, clonazepam, [START ON 10/16/2023] estradiol, ezetimibe, ibuprofen, lidocaine, lurasidone, metoprolol succinate, modafinil, NONFORMULARY OR COMPOUNDED ITEM, oxcarbazepine, pregabalin, trazodone, venlafaxine xr, gemtesa, gemtesa, and AMBULATORY NON FORMULARY MEDICATION.   Allergies: Patient is allergic to amoxicillin and statins.   Social History:  Social History   Tobacco Use   Smoking status: Former    Current packs/day: 0.00  Average packs/day: 1.5 packs/day for 20.0 years (30.0 ttl pk-yrs)    Types: Cigarettes    Start date: 10/28/1974    Quit date: 10/28/1994    Years since quitting: 28.9   Smokeless tobacco: Never  Vaping Use   Vaping status: Never Used  Substance Use Topics   Alcohol use: Yes    Alcohol/week: 0.0 - 1.0 standard drinks of alcohol    Comment: occ   Drug use: No    Relationship status: single Patient lives with her dog and cat.   Patient is not employed. Regular exercise: Yes: water aerobics History of abuse: No  Family History:   Family History  Problem Relation Age of Onset   Stroke Mother 40   Dementia Mother    Bipolar disorder Mother    Hypertension Brother    Hyperlipidemia Brother    Obesity Brother    Colon polyps Brother    Breast cancer Paternal Grandmother        over 15   Skin cancer Paternal Grandmother    Colon cancer Neg Hx    Esophageal cancer Neg Hx    Rectal  cancer Neg Hx    Stomach cancer Neg Hx    Bladder Cancer Neg Hx    Uterine cancer Neg Hx      Review of Systems: Review of Systems  Constitutional:  Positive for malaise/fatigue. Negative for fever and weight loss.       Weight gain  Respiratory:  Negative for cough, shortness of breath and wheezing.   Cardiovascular:  Positive for palpitations. Negative for chest pain and leg swelling.  Gastrointestinal:  Positive for constipation. Negative for abdominal pain and blood in stool.       Leakage  Genitourinary:  Positive for dysuria, frequency and urgency. Negative for hematuria.       Leakage  Skin:  Negative for rash.  Neurological:  Positive for weakness and headaches. Negative for dizziness.  Endo/Heme/Allergies:  Bruises/bleeds easily.  Psychiatric/Behavioral:  Negative for depression. The patient is not nervous/anxious.      OBJECTIVE Physical Exam: Vitals:   10/14/23 1400  BP: 101/62  Pulse: 63  Weight: 229 lb (103.9 kg)  Height: 5\' 6"  (1.676 m)    Physical Exam Constitutional:      General: She is not in acute distress.    Appearance: Normal appearance.  Genitourinary:     Bladder and urethral meatus normal.     No lesions in the vagina.     Genitourinary Comments: Limited bimanual exam due to habitus     Right Labia: No rash, tenderness, lesions, skin changes or Bartholin's cyst.    Left Labia: No tenderness, lesions, skin changes, Bartholin's cyst or rash.    No vaginal discharge, erythema, tenderness, bleeding, ulceration or granulation tissue.     No vaginal prolapse present.    Moderate vaginal atrophy present.     Right Adnexa: not tender, not full and no mass present.    Left Adnexa: not tender, not full and no mass present.    No cervical motion tenderness, discharge, friability, lesion, polyp or nabothian cyst.     Uterus is not enlarged, fixed, tender, irregular or prolapsed.     No uterine mass detected.    Urethral meatus caruncle not present.     No urethral prolapse, tenderness, mass, hypermobility, discharge or stress urinary incontinence with cough stress test present.     Bladder is not tender, urgency on palpation not present and masses not present.  Pelvic Floor: Levator muscle strength is 3/5.    Levator ani is tender (R > L).     Obturator internus not tender, no asymmetrical contractions present and no pelvic spasms present.    Pelvic Floor comments: High tone with delayed relaxation.    Anal wink absent and BC reflex absent.     Symmetrical pelvic sensation. Cardiovascular:     Rate and Rhythm: Normal rate.  Pulmonary:     Effort: Pulmonary effort is normal. No respiratory distress.  Abdominal:     General: There is no distension.     Palpations: Abdomen is soft. There is no mass.     Tenderness: There is no abdominal tenderness.     Hernia: No hernia is present.    Neurological:     Mental Status: She is alert.  Vitals reviewed. Exam conducted with a chaperone present.      POP-Q:   POP-Q  -3                                            Aa   -3                                           Ba  -8                                              C   1                                            Gh  3                                            Pb  8                                            tvl   -3                                            Ap  -3                                            Bp  -8                                              D    Post-Void Residual (PVR) by Bladder Scan: In order to evaluate bladder emptying, we discussed obtaining a postvoid residual  and patient agreed to this procedure.  Procedure: The ultrasound unit was placed on the patient's abdomen in the suprapubic region after the patient had voided.    Post Void Residual - 10/14/23 1547       Post Void Residual   Post Void Residual 150 mL   in/out cath           Straight Catheterization Procedure for PVR: After  verbal consent was obtained from the patient for catheterization to assess bladder emptying and residual volume the urethra and surrounding tissues were prepped with betadine and an in and out catheterization was performed.  PVR was .  Urine appeared clear yellow. The patient tolerated the procedure well.   Laboratory Results: Lab Results  Component Value Date   COLORU Yellow 10/14/2023   CLARITYU Clear 10/14/2023   GLUCOSEUR Negative 10/14/2023   BILIRUBINUR Negative 10/14/2023   KETONESU Negative 10/14/2023   SPECGRAV 1.020 10/14/2023   RBCUR Small 10/14/2023   PHUR 7.0 10/14/2023   PROTEINUR Negative 10/14/2023   UROBILINOGEN 0.2 10/14/2023   LEUKOCYTESUR Negative 10/14/2023    Lab Results  Component Value Date   CREATININE 1.01 (H) 10/22/2022   CREATININE 1.02 (H) 10/16/2021   CREATININE 1.08 (H) 12/21/2020    Lab Results  Component Value Date   HGBA1C 5.4 10/22/2022    Lab Results  Component Value Date   HGB 12.9 03/17/2023     ASSESSMENT AND PLAN Ms. Stahle is a 63 y.o. with:  1. Slow transit constipation   2. MS (multiple sclerosis) (HCC)   3. Mixed stress and urge urinary incontinence   4. Nocturia   5. Recurrent UTI   6. Incontinence of feces, unspecified fecal incontinence type   7. Abnormal urinalysis   8. Elevated serum creatinine   9. High-tone pelvic floor dysfunction     Slow transit constipation Assessment & Plan: - since 28s - discussed association with pelvic floor disorders and urinary incontinence - For constipation, we reviewed the importance of a better bowel regimen.  We also discussed the importance of avoiding chronic straining, as it can exacerbate her pelvic floor symptoms; we discussed treating constipation and straining prior to surgery, as postoperative straining can lead to damage to the repair and recurrence of symptoms. We discussed initiating therapy with increasing fluid intake, fiber supplementation, stool softeners, and  laxatives such as miralax.  - start slow titration of miralax and squatting position for pelvic floor relaxation during defecation - prior use of fiber supplementation with bloating   MS (multiple sclerosis) (HCC) Assessment & Plan: - discussed risk of neurogenic bladder and refractory OAB symptoms - discussed need for repeat UDS to reassess compliance and bladder pressures due to change in lower urinary tract symptoms in 2024 with recurrent UTI to better risk stratify and determine frequency of renal imaging needed - ordered renal US to r/o hydronephrosis, patient to schedule  - UDS in 2019 with increased sensation, decreased MCC, DOI and no SUI. Elevated Pdet at Qmax 36 and low flow with Qmax at 6 after removal of catheter. PVR 74mL with catheter removed. 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  While we do not know the exact etiology of OAB, several treatment options exist. We discussed management including behavioral therapy (decreasing bladder irritants, urge suppression strategies, timed voids, bladder retraining), physical therapy, medication; for refractory cases posterior tibial nerve stimulation, sacral neuromodulation, and intravesical botulinum toxin injection.  For anticholinergic medications, we discussed the potential side effects of anticholinergics including dry eyes, dry mouth, constipation, cognitive  impairment and urinary retention. For Beta-3 agonist medication, we discussed the potential side effect of elevated blood pressure which is more likely to occur in individuals with uncontrolled hypertension. - change from mirabegron 25mg  [employer], [employer] pharmacy), explained that it has to be used daily to be effective - samples and Rx provided for Gemtesa, if cost prohibitive changed back to mirabegron 50mg  - failed Oxybutynin XL 10mg  in the past - encouraged behavioral modification and weight reduction at weight watchers  Orders: -     US RENAL; Future -     Gemtesa; Take 1 tablet (75 mg total) by mouth daily. Leslye Peer; Take 1 tablet (75 mg total) by mouth daily.  Dispense: 30 tablet; Refill: 2 -     Estradiol; Place 1g nightly for two weeks then twice a week after  Dispense: 30 g; Refill: 3  Nocturia Assessment & Plan: - avoid fluid intake 3 hours before bedtime - reassess with Gemtesa  Orders: -     US RENAL; Future -     POCT urinalysis dipstick -     Gemtesa; Take 1 tablet (75 mg total) by mouth daily. Leslye Peer; Take 1 tablet (75 mg total) by mouth daily.  Dispense: 30 tablet; Refill: 2  Recurrent UTI Assessment & Plan: - For treatment of recurrent urinary tract infections, we discussed management of recurrent UTIs including prophylaxis with a daily low dose antibiotic, transvaginal estrogen therapy, D-mannose, and cranberry supplements.  We discussed the role of diagnostic testing such as cystoscopy and upper tract imaging.   - patient to resume low dose vaginal estrogen - denies pyelonephritis or history of kidney stone - pending renal US  Orders: -     US RENAL; Future -     POCT urinalysis dipstick -     Estradiol; Place 1g nightly for two weeks then twice a week after  Dispense: 30 g; Refill: 3  Incontinence of feces, unspecified fecal incontinence type Assessment & Plan: - possibly multifactorial due to constipation, high tone pelvic floor, MS, and peripheral  neuropathy - Treatment options include anti-diarrhea medication (loperamide/ Imodium OTC or prescription lomotil), fiber supplements, physical therapy, and possible sacral neuromodulation or surgery.   - start titration of miralax - referral sent to pelvic floor PT   Abnormal urinalysis Assessment & Plan: - POCT UA + heme, catheterized urine microscopy pending - denies gross hematuria outside of UTI  Orders: -     Urine Microscopic; Future  Elevated serum creatinine Assessment & Plan: - discussed need for annual Cr in the setting of MS and neurogenic bladder - last Cr 1.01 on 10/22/22, pending repeat with PCP in 10/2023    High-tone pelvic floor dysfunction Assessment & Plan: - prior pelvic floor PT with Kegel exercises and reported increased discomfort and frequency of UTIs - referral sent to pelvic floor PT and discussed need for pelvic floor relaxation and  coordination due to pain and discomfort reproduced on pelvic exam - The origin of pelvic floor muscle spasm can be multifactorial, including primary, reactive to a different pain source, trauma, or even part of a centralized pain syndrome.Treatment options include pelvic floor physical therapy, local (vaginal) or oral  muscle relaxants, pelvic muscle trigger point injections or centrally acting pain medications.   - provided samples of vaginal lubrication with topical lidocaine for comfort if patient desires to resume vaginal dilator use - Rx sent for topical lidocaine   Orders: -     Lidocaine; Use 0.5g peasize up to 3 times a day as needed for discomfort and 10-44min prior to vaginal dilator use  Dispense: 35.44 g; Refill: 0   Time spent: I spent 82 minutes dedicated to the care of this patient on the date of this encounter to include pre-visit review of records, face-to-face time with the patient discussing neurogenic bladder, high tone pelvic floor, elevated Cr, abnormal UA, fecal incontinence with slow transit constipation,  recurrent UTI, nocturia, and post visit documentation and ordering medication/ testing.   Loleta Chance, MD

## 2023-10-14 NOTE — Assessment & Plan Note (Signed)
-   possibly multifactorial due to constipation, high tone pelvic floor, MS, and peripheral neuropathy - Treatment options include anti-diarrhea medication (loperamide/ Imodium OTC or prescription lomotil), fiber supplements, physical therapy, and possible sacral neuromodulation or surgery.   - start titration of miralax - referral sent to pelvic floor PT

## 2023-10-14 NOTE — Assessment & Plan Note (Signed)
-   avoid fluid intake 3 hours before bedtime - reassess with Leslye Peer

## 2023-10-16 ENCOUNTER — Other Ambulatory Visit: Payer: Self-pay | Admitting: Nurse Practitioner

## 2023-10-16 NOTE — Addendum Note (Signed)
 Addended byWyatt Haste T on: 10/16/2023 02:19 PM   Modules accepted: Orders

## 2023-10-20 ENCOUNTER — Encounter: Payer: Self-pay | Admitting: Cardiology

## 2023-10-20 ENCOUNTER — Ambulatory Visit: Attending: Cardiology | Admitting: Cardiology

## 2023-10-20 VITALS — BP 102/60 | HR 66 | Resp 16 | Ht 66.0 in | Wt 234.8 lb

## 2023-10-20 DIAGNOSIS — I471 Supraventricular tachycardia, unspecified: Secondary | ICD-10-CM | POA: Diagnosis not present

## 2023-10-20 DIAGNOSIS — I351 Nonrheumatic aortic (valve) insufficiency: Secondary | ICD-10-CM | POA: Diagnosis not present

## 2023-10-20 DIAGNOSIS — I493 Ventricular premature depolarization: Secondary | ICD-10-CM | POA: Diagnosis not present

## 2023-10-20 DIAGNOSIS — E782 Mixed hyperlipidemia: Secondary | ICD-10-CM | POA: Diagnosis not present

## 2023-10-20 NOTE — Progress Notes (Signed)
 Cardiology Office Note:    Date:  10/20/2023   ID:  Sarah Hogan, DOB 13-Jul-1961, MRN [id number]  PCP:  Tollie Eth, NP  Cardiologist:  Armanda Magic, MD    Referring MD: Tollie Eth, NP   Chief Complaint  Patient presents with   Follow-up    SVT, PVCs, aortic insufficiency, hyperlipidemia    History of Present Illness:    Sarah Hogan is a 63 y.o. female with a hx of hyperlipidemia, MS, GERD, migraines, bipolar disorder, tobacco abuse with 40 pk year history of smoking and normal coronary arteries by cath for positive stress test done for fatigue in 2015.  She has a history SVT with PACs and PVCs on event monitor. Her echo 08/2016 showed normal LVF with ER 65-70% with mild AR and MR.  She is here today for followup and is doing well.  She denies any chest pain or pressure, SOB, DOE, PND, orthopnea, LE edema, dizziness or syncope.  She occasionally has some mild palpitations mainly when she is laying down in bed. She is compliant with her meds and is tolerating meds with no SE.    Past Medical History:  Diagnosis Date   Allergy    Anxiety    Bipolar 2 disorder (HCC)    Cervical polyp 12/16/2018   Depression    Former smoker 12/16/2018   Hyperlipidemia    Leukopenia    Mild aortic insufficiency    mild by echo 09/2022   Mild mitral regurgitation    by echo 07/2016   MS (multiple sclerosis) (HCC)    Neuromuscular disorder (HCC)    MS    Normal coronary arteries cath 2015   Overactive bladder    PVC's (premature ventricular contractions) 04/09/2016   SVT (supraventricular tachycardia) (HCC) 04/22/2017   Vitamin D deficiency     Past Surgical History:  Procedure Laterality Date   BREAST BIOPSY Left 2002   CARPAL TUNNEL RELEASE     CHOLECYSTECTOMY     COLONOSCOPY  2010   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     pigmented squamous cell carcinoma removal     POLYPECTOMY  2010   1 polyp- rectum , no path    UPPER GASTROINTESTINAL ENDOSCOPY  2010    Current  Medications: Current Meds  Medication Sig   baclofen (LIORESAL) 10 MG tablet Take 10 mg by mouth 2 (two) times daily.   Biotin 5000 MCG CAPS Take by mouth.   cholecalciferol (VITAMIN D3) 25 MCG (1000 UNIT) tablet Take 1,000 Units by mouth daily.   Chromium Picolinate (CHROMIUM PICOLATE PO) Take by mouth.   clonazePAM (KLONOPIN) 0.5 MG tablet Take 1 tablet (0.5 mg total) by mouth daily as needed for anxiety.   estradiol (ESTRACE) 0.1 MG/GM vaginal cream Place 1g nightly for two weeks then twice a week after   ezetimibe (ZETIA) 10 MG tablet TAKE 1 TABLET BY MOUTH DAILY   ibuprofen (ADVIL) 800 MG tablet Take 1 tablet (800 mg total) by mouth every 8 (eight) hours as needed.   lurasidone (LATUDA) 40 MG TABS tablet Take 1 tablet (40 mg total) by mouth daily with supper.   metoprolol succinate (TOPROL XL) 50 MG 24 hr tablet Take 1 tablet (50 mg total) by mouth daily. Take with or immediately following a meal.   modafinil (PROVIGIL) 100 MG tablet Take 100 mg by mouth daily as needed (Driving).   NONFORMULARY OR COMPOUNDED ITEM Compounded progesterone SR 100mg .  1 capsule daily.   OXcarbazepine (TRILEPTAL)  150 MG tablet Take 1 tablet (150 mg total) by mouth 2 (two) times daily.   pregabalin (LYRICA) 50 MG capsule Take 50 mg by mouth 2 (two) times daily.   traZODone (DESYREL) 100 MG tablet Take 100 mg by mouth at bedtime. Takes 1/2-1 tablet at bedtime as needed   venlafaxine XR (EFFEXOR-XR) 75 MG 24 hr capsule TAKE ONE CAPSULE BY MOUTH DAILY WITH BREAKFAST   Vibegron (GEMTESA) 75 MG TABS Take 1 tablet (75 mg total) by mouth daily.     Allergies:   Amoxicillin and Statins   Social History   Socioeconomic History   Marital status: Single    Spouse name: Not on file   Number of children: Not on file   Years of education: Not on file   Highest education level: Not on file  Occupational History   Not on file  Tobacco Use   Smoking status: Former    Current packs/day: 0.00    Average packs/day:  1.5 packs/day for 20.0 years (30.0 ttl pk-yrs)    Types: Cigarettes    Start date: 10/28/1974    Quit date: 10/28/1994    Years since quitting: 28.9   Smokeless tobacco: Never  Vaping Use   Vaping status: Never Used  Substance and Sexual Activity   Alcohol use: Yes    Alcohol/week: 0.0 - 1.0 standard drinks of alcohol    Comment: occ   Drug use: No   Sexual activity: Not Currently    Partners: Male    Birth control/protection: Post-menopausal    Comment: since 2008  Other Topics Concern   Not on file  Social History Narrative   Not on file   Social Drivers of Health   Financial Resource Strain: Low Risk  (10/23/2022)   Overall Financial Resource Strain (CARDIA)    Difficulty of Paying Living Expenses: Not hard at all  Food Insecurity: No Food Insecurity (10/23/2022)   Hunger Vital Sign    Worried About Running Out of Food in the Last Year: Never true    Ran Out of Food in the Last Year: Never true  Transportation Needs: No Transportation Needs (10/23/2022)   PRAPARE - Administrator, Civil Service (Medical): No    Lack of Transportation (Non-Medical): No  Physical Activity: Insufficiently Active (10/23/2022)   Exercise Vital Sign    Days of Exercise per Week: 2 days    Minutes of Exercise per Session: 10 min  Stress: No Stress Concern Present (10/23/2022)   [employer] of Occupational Health - Occupational Stress Questionnaire    Feeling of Stress : Not at all  Social Connections: Moderately Integrated (10/23/2022)   Social Connection and Isolation Panel [NHANES]    Frequency of Communication with Friends and Family: Three times a week    Frequency of Social Gatherings with Friends and Family: Not on file    Attends Religious Services: More than 4 times per year    Active Member of [employer] or Organizations: Yes    Attends Engineer, structural: More than 4 times per year    Marital Status: Never married     Family History: The patient's family history  includes Bipolar disorder in her mother; Breast cancer in her paternal grandmother; Colon polyps in her brother; Dementia in her mother; Hyperlipidemia in her brother; Hypertension in her brother; Obesity in her brother; Skin cancer in her paternal grandmother; Stroke (age of onset: 67) in her mother. There is no history of Colon cancer, Esophageal cancer,  Rectal cancer, Stomach cancer, Bladder Cancer, or Uterine cancer.  ROS:   Please see the history of present illness.    ROS  All other systems reviewed and negative.   EKGs/Labs/Other Studies Reviewed:    The following studies were reviewed today:  EKG Interpretation Date/Time:  Monday October 20 2023 14:14:19 EDT Ventricular Rate:  64 PR Interval:  188 QRS Duration:  82 QT Interval:  374 QTC Calculation: 385 R Axis:   4  Text Interpretation: Normal sinus rhythm Low voltage QRS Nonspecific T wave abnormality No previous ECGs available Confirmed by Armanda Magic ([id number]) on 10/20/2023 2:22:15 PM     Recent Labs: 10/22/2022: BUN 15; Creatinine, Ser 1.01; Potassium 4.5; Sodium 141; TSH 1.630 01/27/2023: ALT 11 03/17/2023: Hemoglobin 12.9; Platelets 189   Recent Lipid Panel    Component Value Date/Time   CHOL 173 01/27/2023 1052   TRIG 130 01/27/2023 1052   HDL 50 01/27/2023 1052   CHOLHDL 3.5 01/27/2023 1052   CHOLHDL 4.2 02/26/2017 1126   VLDL 38 (H) 02/26/2017 1126   LDLCALC 100 (H) 01/27/2023 1052    Physical Exam:    VS:  BP 102/60 (BP Location: Left Arm, Patient Position: Sitting, Cuff Size: Large)   Pulse 66   Resp 16   Ht 5\' 6"  (1.676 m)   Wt 234 lb 12.8 oz (106.5 kg)   LMP 06/28/2008   SpO2 96%   BMI 37.90 kg/m     Wt Readings from Last 3 Encounters:  10/20/23 234 lb 12.8 oz (106.5 kg)  10/14/23 229 lb (103.9 kg)  07/25/23 240 lb 6.4 oz (109 kg)    GEN: Well nourished, well developed in no acute distress HEENT: Normal NECK: No JVD; No carotid bruits LYMPHATICS: No lymphadenopathy CARDIAC:RRR, no murmurs,  rubs, gallops RESPIRATORY:  Clear to auscultation without rales, wheezing or rhonchi  ABDOMEN: Soft, non-tender, non-distended MUSCULOSKELETAL:  No edema; No deformity  SKIN: Warm and dry NEUROLOGIC:  Alert and oriented x 3 PSYCHIATRIC:  Normal affect   ASSESSMENT:    1. SVT (supraventricular tachycardia) (HCC)   2. PVC's (premature ventricular contractions)   3. Mild aortic insufficiency   4. Mixed hyperlipidemia       PLAN:    In order of problems listed above:  1.  SVT -Heart monitor 10/09/2022 showed several runs of SVT consistent with PAT lasting up to 10 beats in a row as well as rare PVCs  -her palpitations are very well controlled on BB and she really only notices them when she is quiet in bed at night -Continue prescription drug management with Toprol XL 50mg  daily with PRN refills  2.  PVCs -Continue BB  3.  Mitral and aortic regurgitation -echo 10/09/2022 with mild AI and trivial MR -repeat 2D echo for AI  4.  HLD -followed by PCP -She is statin intolerant -Continue prescription drug management Zetia 10 mg daily -Check FLP >>her PCP will do this next month and send me a copy of it -I have personally reviewed and interpreted outside labs performed by patient's PCP which showed ALT 18 on 08/21/2023  Medication Adjustments/Labs and Tests Ordered: Current medicines are reviewed at length with the patient today.  Concerns regarding medicines are outlined above.  Orders Placed This Encounter  Procedures   EKG 12-Lead   No orders of the defined types were placed in this encounter.   Signed, Armanda Magic, MD  10/20/2023 2:34 PM    [facility]

## 2023-10-20 NOTE — Progress Notes (Signed)
 Submitted PA for [employer] on Cover my Meds. Key: [id number] -  PA Case ID: [id number]  Rx #: [id number]   Received instant approval: Case [id number] Status: Approved Prior Auth: Coverage Start Date:07/23/2023 - Coverage End Date:10/19/2024

## 2023-10-20 NOTE — Patient Instructions (Signed)
 Medication Instructions:  Your physician recommends that you continue on your current medications as directed. Please refer to the Current Medication list given to you today.  *If you need a refill on your cardiac medications before your next appointment, please call your pharmacy*  Lab Work: NONE If you have labs (blood work) drawn today and your tests are completely normal, you will receive your results only by: MyChart Message (if you have MyChart) OR A paper copy in the mail If you have any lab test that is abnormal or we need to change your treatment, we will call you to review the results.  Testing/Procedures: Echocardiogram Your physician has requested that you have an echocardiogram. Echocardiography is a painless test that uses sound waves to create images of your heart. It provides your doctor with information about the size and shape of your heart and how well your heart's chambers and valves are working. This procedure takes approximately one hour. There are no restrictions for this procedure. Please do NOT wear cologne, perfume, aftershave, or lotions (deodorant is allowed). Please arrive 15 minutes prior to your appointment time.  Please note: We ask at that you not bring children with you during ultrasound (echo/ vascular) testing. Due to room size and safety concerns, children are not allowed in the ultrasound rooms during exams. Our front office staff cannot provide observation of children in our lobby area while testing is being conducted. An adult accompanying a patient to their appointment will only be allowed in the ultrasound room at the discretion of the ultrasound technician under special circumstances. We apologize for any inconvenience.   Follow-Up: At [facility], you and your health needs are our priority.  As part of our continuing mission to provide you with exceptional heart care, our providers are all part of one team.  This team includes your primary  Cardiologist (physician) and Advanced Practice Providers or APPs (Physician Assistants and Nurse Practitioners) who all work together to provide you with the care you need, when you need it.  Your next appointment:   1 year(s)  Provider:   Armanda Magic, MD     We recommend signing up for the patient portal called "MyChart".  Sign up information is provided on this After Visit Summary.  MyChart is used to connect with patients for Virtual Visits (Telemedicine).  Patients are able to view lab/test results, encounter notes, upcoming appointments, etc.  Non-urgent messages can be sent to your provider as well.   To learn more about what you can do with MyChart, go to [email or web address].   Other Instructions       1st Floor: - Lobby - Registration  - Pharmacy  - Lab - Cafe  2nd Floor: - PV Lab - Diagnostic Testing (echo, CT, nuclear med)  3rd Floor: - Vacant  4th Floor: - TCTS (cardiothoracic surgery) - AFib Clinic - Structural Heart Clinic - Vascular Surgery  - Vascular Ultrasound  5th Floor: - HeartCare Cardiology (general and EP) - Clinical Pharmacy for coumadin, hypertension, lipid, weight-loss medications, and med management appointments    Valet parking services will be available as well.

## 2023-10-20 NOTE — Addendum Note (Signed)
 Addended by: Erick Alley on: 10/20/2023 02:39 PM   Modules accepted: Orders

## 2023-10-22 ENCOUNTER — Ambulatory Visit (HOSPITAL_COMMUNITY)
Admission: RE | Admit: 2023-10-22 | Discharge: 2023-10-22 | Disposition: A | Discharge status: Home / Self Care | Source: Ambulatory Visit | Attending: Obstetrics | Admitting: Obstetrics

## 2023-10-22 ENCOUNTER — Encounter: Payer: Self-pay | Admitting: Obstetrics

## 2023-10-22 DIAGNOSIS — G35 Multiple sclerosis: Secondary | ICD-10-CM

## 2023-10-22 DIAGNOSIS — N3946 Mixed incontinence: Secondary | ICD-10-CM

## 2023-10-22 DIAGNOSIS — R319 Hematuria, unspecified: Secondary | ICD-10-CM | POA: Diagnosis not present

## 2023-10-22 DIAGNOSIS — N390 Urinary tract infection, site not specified: Secondary | ICD-10-CM

## 2023-10-22 DIAGNOSIS — R351 Nocturia: Secondary | ICD-10-CM

## 2023-10-23 ENCOUNTER — Ambulatory Visit (INDEPENDENT_AMBULATORY_CARE_PROVIDER_SITE_OTHER)

## 2023-10-23 ENCOUNTER — Other Ambulatory Visit (HOSPITAL_COMMUNITY)
Admission: RE | Admit: 2023-10-23 | Discharge: 2023-10-23 | Disposition: A | Discharge status: Home / Self Care | Source: Other Acute Inpatient Hospital | Attending: Obstetrics | Admitting: Obstetrics

## 2023-10-23 VITALS — BP 106/59 | HR 62

## 2023-10-23 DIAGNOSIS — R300 Dysuria: Secondary | ICD-10-CM

## 2023-10-23 DIAGNOSIS — R82998 Other abnormal findings in urine: Secondary | ICD-10-CM

## 2023-10-23 DIAGNOSIS — R319 Hematuria, unspecified: Secondary | ICD-10-CM

## 2023-10-23 DIAGNOSIS — G35 Multiple sclerosis: Secondary | ICD-10-CM

## 2023-10-23 DIAGNOSIS — R1030 Lower abdominal pain, unspecified: Secondary | ICD-10-CM | POA: Diagnosis not present

## 2023-10-23 DIAGNOSIS — R8290 Unspecified abnormal findings in urine: Secondary | ICD-10-CM

## 2023-10-23 LAB — POCT URINALYSIS DIPSTICK
Bilirubin, UA: NEGATIVE
Glucose, UA: NEGATIVE
Ketones, UA: NEGATIVE
Nitrite, UA: NEGATIVE
Protein, UA: NEGATIVE
Spec Grav, UA: 1.02 (ref 1.010–1.025)
Urobilinogen, UA: 0.2 U/dL
pH, UA: 7 (ref 5.0–8.0)

## 2023-10-23 LAB — URINALYSIS, ROUTINE W REFLEX MICROSCOPIC
Bilirubin Urine: NEGATIVE
Glucose, UA: NEGATIVE mg/dL
Ketones, ur: NEGATIVE mg/dL
Leukocytes,Ua: NEGATIVE
Nitrite: NEGATIVE
Protein, ur: NEGATIVE mg/dL
Specific Gravity, Urine: 1.025 (ref 1.005–1.030)
pH: 6 (ref 5.0–8.0)

## 2023-10-23 LAB — URINALYSIS, MICROSCOPIC (REFLEX)

## 2023-10-23 MED ORDER — NITROFURANTOIN MONOHYD MACRO 100 MG PO CAPS
100.0000 mg | ORAL_CAPSULE | Freq: Two times a day (BID) | ORAL | 0 refills | Status: AC
Start: 2023-10-23 — End: 2023-10-28

## 2023-10-23 NOTE — Progress Notes (Signed)
 Remas arrived today with dysuria and lower abdominal pain. Patient is not experiencing fever, unstable vitals and/or one-sided back flank pain. Patient has not had a recent hospitalization due to UTI.  Last visit in the office was 10/14/2023.  Per protocol:   The most recent Urinalysis completed on 10-14-2023 and was not normal.  Last Creatinine level  Lab Results  Component Value Date   CREATININE 1.01 (H) 10/22/2022    An urine specimen was collected and POCT urinalysis completed. [x] A cath specimen was collected due to patient's current condition, symptoms or post-procedural state.  Total urine output by catheter is Output by Drain (mL) 10/21/23 0701 - 10/21/23 1900 10/21/23 1901 - 10/22/23 0700 10/22/23 0701 - 10/22/23 1900 10/22/23 1901 - 10/23/23 0700 10/23/23 0701 - 10/23/23 1402  Patient has no LDAs of requested type attached.    Marland Kitchen    POCT Urine results is not normal.  Urine micro was sent per protocol for abnormal urinalysis.  Urine culture was sent per protocol for abnormal urinalysis.     [x] Pt was notified of positive urine results and plan for additional urine testing. We will contact you within the next 3-4 days with these results.   [] No Prescription was sent to your pharmacy.  The additional testing will indicate if a prescription is needed.   [x] Patient was notified of abnormal urine results. The following prescription is sent to your preferred pharmacy.  []  Macrobid 100mg  #10 1 tablet by mouth twice daily with food for 5 days      []  Bactrim DS 800-160mg  #6 1 tablet by mouth twice daily for 3 days        []  Due to your current medication allergies, an alternate prescription was discussed with your provider and will be prescribed and sent to your pharmacy.  [x] You can take over the counter AZO two tablets up to three times a day for two days.  Take AZO tablets with a full glass of water. AZO will turn your urine orange, this is normal.   [] The patient was notified  of negative urine results.  If symptoms persist, you may take over the counter AZO two tablets up to three times a day for two days.  AZO will turn your urine orange, this is normal.  Contact the office back to schedule an appointment if your symptoms persist or worsen or you develop additional symptoms.       CC'd note to patient's provider.

## 2023-10-24 ENCOUNTER — Encounter: Payer: Self-pay | Admitting: Obstetrics

## 2023-10-25 LAB — URINE CULTURE: Culture: 60000 — AB

## 2023-10-28 ENCOUNTER — Ambulatory Visit: Payer: No Typology Code available for payment source | Admitting: Nurse Practitioner

## 2023-10-28 ENCOUNTER — Encounter: Payer: Self-pay | Admitting: Nurse Practitioner

## 2023-10-28 VITALS — BP 92/64 | HR 58 | Ht 65.0 in | Wt 231.6 lb

## 2023-10-28 DIAGNOSIS — Z0000 Encounter for general adult medical examination without abnormal findings: Secondary | ICD-10-CM | POA: Diagnosis not present

## 2023-10-28 DIAGNOSIS — E559 Vitamin D deficiency, unspecified: Secondary | ICD-10-CM

## 2023-10-28 DIAGNOSIS — F3181 Bipolar II disorder: Secondary | ICD-10-CM

## 2023-10-28 DIAGNOSIS — I471 Supraventricular tachycardia, unspecified: Secondary | ICD-10-CM

## 2023-10-28 DIAGNOSIS — Z23 Encounter for immunization: Secondary | ICD-10-CM | POA: Diagnosis not present

## 2023-10-28 DIAGNOSIS — E782 Mixed hyperlipidemia: Secondary | ICD-10-CM | POA: Diagnosis not present

## 2023-10-28 DIAGNOSIS — G35 Multiple sclerosis: Secondary | ICD-10-CM | POA: Diagnosis not present

## 2023-10-28 DIAGNOSIS — G9332 Myalgic encephalomyelitis/chronic fatigue syndrome: Secondary | ICD-10-CM

## 2023-10-28 DIAGNOSIS — Z79899 Other long term (current) drug therapy: Secondary | ICD-10-CM | POA: Diagnosis not present

## 2023-10-28 DIAGNOSIS — D8989 Other specified disorders involving the immune mechanism, not elsewhere classified: Secondary | ICD-10-CM | POA: Diagnosis not present

## 2023-10-28 LAB — LIPID PANEL

## 2023-10-28 MED ORDER — EZETIMIBE 10 MG PO TABS: 10.0000 mg | ORAL_TABLET | Freq: Every day | ORAL | 3 refills | Status: AC

## 2023-10-28 NOTE — Patient Instructions (Addendum)
 I do recommend taking the ibuprofen at bedtime to help with the jaw pain. I do recommend talking to the dentist to see what options are there.   Keep up with the exercise and diet changes. This is a great change!! I am proud of you.   WEIGHT LOSS PLANNING  For best management of weight, it is vital to balance intake versus output. This means the number of calories burned per day must be less than the calories you take in with food and drink.   I recommend trying to follow a diet with the following: Calories: 1200-1500 calories per day Carbohydrates: 150-180 grams of carbohydrates per day  Why: Gives your body enough "quick fuel" for cells to maintain normal function without sending them into starvation mode.  Protein: At least 90 grams of protein per day- 30 grams with each meal Why: Protein takes longer and uses more energy than carbohydrates to break down for fuel. The carbohydrates in your meals serves as quick energy sources and proteins help use some of that extra quick energy to break down to produce long term energy. This helps you not feel hungry as quickly and protein breakdown burns calories.  Water: Drink AT LEAST 64 ounces of water per day  Why: Water is essential to healthy metabolism. Water helps to fill the stomach and keep you fuller longer. Water is required for healthy digestion and filtering of waste in the body.  Fat: Limit fats in your diet- when choosing fats, choose foods with lower fats content such as lean meats (chicken, fish, [place]).  Why: Increased fat intake leads to storage "for later". Once you burn your carbohydrate energy, your body goes into fat and protein breakdown mode to help you loose weight.  Cholesterol: Fats and oils that are LIQUID at room temperature are best. Choose vegetable oils (olive oil, avocado oil, nuts). Avoid fats that are SOLID at room temperature (animal fats, processed meats). Healthy fats are often found in whole grains, beans, nuts, seeds,  and berries.  Why: Elevated cholesterol levels lead to build up of cholesterol on the inside of your blood vessels. This will eventually cause the blood vessels to become hard and can lead to high blood pressure and damage to your organs. When the blood flow is reduced, but the pressure is high from cholesterol buildup, parts of the cholesterol can break off and form clots that can go to the brain or heart leading to a stroke or heart attack.  Fiber: Increase amount of SOLUBLE the fiber in your diet. This helps to fill you up, lowers cholesterol, and helps with digestion. Some foods high in soluble fiber are oats, peas, beans, apples, carrots, barley, and citrus fruits.   Why: Fiber fills you up, helps remove excess cholesterol, and aids in healthy digestion which are all very important in weight management.   I recommend the following as a minimum activity routine: Purposeful walk or other physical activity at least 20 minutes every single day. This means purposefully taking a walk, jog, bike, swim, treadmill, elliptical, dance, etc.  This activity should be ABOVE your normal daily activities, such as walking at work. Goal exercise should be at least 150 minutes a week- work your way up to this.   Heart Rate: Your maximum exercise heart rate should be 220 - Your Age in Years. When exercising, get your heart rate up, but avoid going over the maximum targeted heart rate.  60-70% of your maximum heart rate is where you tend  to burn the most fat. To find this number:  220 - Age In Years= Max HR  Max HR x 0.6 (or 0.7) = Fat Burning HR The Fat Burning HR is your goal heart rate while working out to burn the most fat.  NEVER exercise to the point your feel lightheaded, weak, nauseated, dizzy. If you experience ANY of these symptoms- STOP exercise! Allow yourself to cool down and your heart rate to come down. Then restart slower next time.  If at ANY TIME you feel chest pain or chest pressure during  exercise, STOP IMMEDIATELY and seek medical attention.

## 2023-10-28 NOTE — Progress Notes (Signed)
 10/28/2023   Vitals:  BP 92/64   Pulse (!) 58   Ht 5\' 5"  (1.651 m)   Wt 231 lb 9.6 oz (105.1 kg)   LMP 06/28/2008   BMI 38.54 kg/m   Body mass index is 38.54 kg/m. Sarah Hogan is a 63 y.o. female who presents for Subsequent Medicare Annual Wellness Exam  Care Team Members: Current Providers as of 10/28/2023 PCP: Tollie Eth, NP Care Team Provider: Quintella Reichert, MD Care Team Provider: Jerene Bears, MD Encounter Provider: Tollie Eth, NP, starting on Tue Oct 28, 2023 12:00 AM Referring Provider: Tollie Eth, NP, starting on Tue Oct 28, 2023 12:00 AM Attending Provider: Tollie Eth, NP, starting on Tue Oct 22, 2022 11:48 AM (Active)   Method of visit:  in person In the event virtual visit conducted, the patient consented to a virtual visit. Patient consented to have virtual visit and was identified by two identifiers.  Encounter participants: Patient: Sarah Hogan - located AWV Patient Visit Location: In Office Nurse/Provider: Tollie Eth - located Virtual Visit Location Provider: Office/Clinic Others (if applicable): patient only  History of Present Illness Sarah Hogan is a 63 year old female with multiple sclerosis and depression who presents for a Medicare wellness and physical exam.  She has been on Medicare since 2008 due to multiple sclerosis and depression. She recently received a pneumonia vaccine.  She faces ongoing challenges with weight management. She has been attending Weight Wat[employer] and has lost 10 pounds over 10 weeks, but recently gained 3.2 pounds due to constipation. She engages in water aerobics twice a week and uses a seated elliptical machine. Despite giving up sweets since January 1st and tracking her food intake, she is not losing weight and notes her clothes are fitting tighter. She has a history of foot injury which previously limited her ability to exercise.  She recently started seeing a new urogynecologist who identified a tight pelvic  floor and advised against Kegel exercises. She is undergoing physical therapy for this issue.  She experienced a urinary tract infection after starting vaginal medication, which she initially attributed to medication side effects. She is finishing a course of antibiotics and reports feeling better, though still bloated.  She experiences jaw pain that radiates to her teeth, worsens by bedtime, and is alleviated by 800 mg of ibuprofen. The pain is associated with jaw cracking and is suspected to be related to tardive dyskinesia, as she also experiences lip licking and leg jiggling. She has used over-the-counter mouth guards in the past but finds them ineffective.  She reports cramping that began with the UTI and started fiber supplementation, which initially caused constipation. She now takes a full dose of fiber and has regular bowel movements. She notes cramping is not consistently related to bowel movements.  Review of Systems:  Neuro: Denies difficulty remembering daily tasks, people, or places.  Ear: Denies difficulty hearing or need to increase volume on television or telephone to hear Eye: Denies visual changes, difficulty reading normal print, or visual field deficits. Cardiac: Denies chest pain, palpitations, dizziness, shortness of breath, pain in lower extremities, or night time waking with shortness of breath. Lung: Denies shortness of breath, difficulty breathing, chronic cough, or dizziness.  GI: Denies changes in bowel habits, blood in stool, difficulty passing stool, decreased intake of food or drink, nausea, or vomiting.  GU: Denies changes in urinary habits, dark urine, malodorous urine, increased or decreased urination, or urinary incontinence.  MSK:  Denies weakness in extremities, difficulty walking, difficulty grasping, or new MSK pain.  Skin: Denies changes to the skin, fragile skin, or increased bruising.  Constitution: Denies fatigue, weakness, or confusion.   Patient rating  of health: better as this time last year  Clinical Intake: Pre-visit preparation completed: Yes  Pain : No/denies pain     Nutritional Status: BMI > 30  Obese Nutritional Risks: None Diabetes: No  Activities of Daily Living: Independent Ambulation: Independent Medication Administration: Independent Home Management: Independent  Barriers to Care Management & Learning: None  Information provided on Community resources: No  How often do you need to have someone help you when you read instructions, pamphlets, or other written materials from your doctor or pharmacy?: 1 - Never  Interpreter Needed?: No        10/28/2023   11:05 AM 10/23/2022    7:32 AM 10/16/2021   11:03 AM  Advanced Directives  Does Patient Have a Medical Advance Directive? Yes Yes Yes  Type of Estate agent of [place];Living will Healthcare Power of [place];Living will Living will  Does patient want to make changes to medical advance directive? Yes (ED - Information included in AVS) No - Patient declined   Copy of Healthcare Power of Attorney in Chart? Yes - validated most recent copy scanned in chart (See row information) No - copy requested     Social Determinants of Health SDOH Screenings   Food Insecurity: No Food Insecurity (10/23/2022)  Housing: Low Risk  (10/23/2022)  Transportation Needs: No Transportation Needs (10/23/2022)  Utilities: Not At Risk (10/23/2022)  Alcohol Screen: Low Risk  (10/23/2022)  Depression (PHQ2-9): Low Risk  (10/28/2023)  Financial Resource Strain: Low Risk  (10/23/2022)  Physical Activity: Insufficiently Active (10/23/2022)  Social Connections: Moderately Integrated (10/23/2022)  Stress: No Stress Concern Present (10/23/2022)  Tobacco Use: Medium Risk (10/28/2023)     Functional Status Survey: Is the patient deaf or have difficulty hearing?: No Does the patient have difficulty seeing, even when wearing glasses/contacts?: No Does the patient have difficulty  concentrating, remembering, or making decisions?: Yes Does the patient have difficulty walking or climbing stairs?: No Does the patient have difficulty dressing or bathing?: No Does the patient have difficulty doing errands alone such as visiting a doctor's office or shopping?: No   Annual Goal:  Goals      Weight (lb) < 200 lb (90.7 kg)     Working with weight watchers, water aerobic, seated elliptical. She has given up cake, cookies, and ice cream. She has not lost any weight yet, but she plans to continue with this.          Advanced Directives <no information>     Fall Risk    10/28/2023   11:02 AM 10/22/2022   10:34 AM 10/16/2021   10:57 AM 12/21/2020   11:11 AM 12/21/2019    9:27 AM  Fall Risk   Falls in the past year? 1 0 0 0 0  Number falls in past yr: 0 0 0 0 0  Injury with Fall? 0 0 0 0   Comment bruise tailbone      Risk for fall due to : Other (Comment) No Fall Risks  No Fall Risks   Risk for fall due to: Comment slide on a throw rug      Follow up Falls evaluation completed Falls evaluation completed  Falls evaluation completed     Medicare Risk  Medicare Risk at Home - 10/28/23 1108  Any stairs in or around the home? Yes    If so, are there any without handrails? No    Home free of loose throw rugs in walkways, pet beds, electrical cords, etc? Yes    Adequate lighting in your home to reduce risk of falls? Yes    Life alert? No    Use of a cane, walker or w/c? No    Grab bars in the bathroom? No    Shower chair or bench in shower? No    Elevated toilet seat or a handicapped toilet? No              Cognitive Function Normal: Yes Exam Completed:         10/23/2022    7:34 AM  6CIT Screen  What Year? 0 points  What month? 0 points  What time? 0 points  Count back from 20 0 points  Months in reverse 0 points  Repeat phrase 0 points  Total Score 0 points    Mini-Cog - 10/28/23 1104     Normal clock drawing test? yes    How many words  correct? 2             Depression Screening    10/28/2023   11:03 AM 10/22/2022   10:35 AM 10/16/2021   10:57 AM 02/12/2021    4:09 PM 12/21/2020   11:11 AM  Depression screen PHQ 2/9  Decreased Interest 0 0 1 0 0  Down, Depressed, Hopeless 0 0 1 0 0  PHQ - 2 Score 0 0 2 0 0  Altered sleeping   1  0  Tired, decreased energy   1  0  Change in appetite   0  0  Feeling bad or failure about yourself    1  0  Trouble concentrating   0  3  Moving slowly or fidgety/restless   1  1  Suicidal thoughts   0  0  PHQ-9 Score   6  4  Difficult doing work/chores     Not difficult at all     Activities of Daily Living    10/28/2023   11:05 AM  In your present state of health, do you have any difficulty performing the following activities:  Hearing? 0  Vision? 0  Difficulty concentrating or making decisions? 1  Walking or climbing stairs? 0  Dressing or bathing? 0  Doing errands, shopping? 0  Preparing Food and eating ? Y  Comment preparing food yes  Using the Toilet? N  In the past six months, have you accidently leaked urine? Y  Do you have problems with loss of bowel control? Y  Comment sometimes  Managing your Medications? N  Managing your Finances? Y  Housekeeping or managing your Housekeeping? Y    Tobacco Social History   Tobacco Use  Smoking Status Former   Current packs/day: 0.00   Average packs/day: 1.5 packs/day for 20.0 years (30.0 ttl pk-yrs)   Types: Cigarettes   Start date: 10/28/1974   Quit date: 10/28/1994   Years since quitting: 29.0  Smokeless Tobacco Never     Counseling given: Not Answered   Hospitalizations in the Past Year: none  ED Visits in the Past Year: No  Surgeries in the Past Year: No   History    Medication List Current Meds  Medication Sig   baclofen (LIORESAL) 10 MG tablet Take 10 mg by mouth 2 (two) times daily.   Biotin 5000 MCG CAPS Take by  mouth.   cholecalciferol (VITAMIN D3) 25 MCG (1000 UNIT) tablet Take 1,000 Units by  mouth daily.   Chromium Picolinate (CHROMIUM PICOLATE PO) Take by mouth.   clonazePAM (KLONOPIN) 0.5 MG tablet Take 1 tablet (0.5 mg total) by mouth daily as needed for anxiety.   estradiol (ESTRACE) 0.1 MG/GM vaginal cream Place 1g nightly for two weeks then twice a week after   ibuprofen (ADVIL) 800 MG tablet Take 1 tablet (800 mg total) by mouth every 8 (eight) hours as needed.   lurasidone (LATUDA) 40 MG TABS tablet Take 1 tablet (40 mg total) by mouth daily with supper.   metoprolol succinate (TOPROL XL) 50 MG 24 hr tablet Take 1 tablet (50 mg total) by mouth daily. Take with or immediately following a meal.   modafinil (PROVIGIL) 100 MG tablet Take 100 mg by mouth daily as needed (Driving).   nitrofurantoin, macrocrystal-monohydrate, (MACROBID) 100 MG capsule Take 1 capsule (100 mg total) by mouth 2 (two) times daily for 5 days.   NONFORMULARY OR COMPOUNDED ITEM Compounded progesterone SR 100mg .  1 capsule daily.   OXcarbazepine (TRILEPTAL) 150 MG tablet Take 1 tablet (150 mg total) by mouth 2 (two) times daily.   pregabalin (LYRICA) 50 MG capsule Take 50 mg by mouth 2 (two) times daily.   traZODone (DESYREL) 100 MG tablet Take 100 mg by mouth at bedtime. Takes 1/2-1 tablet at bedtime as needed   venlafaxine XR (EFFEXOR-XR) 75 MG 24 hr capsule TAKE ONE CAPSULE BY MOUTH DAILY WITH BREAKFAST   Vibegron (GEMTESA) 75 MG TABS Take 1 tablet (75 mg total) by mouth daily.   [DISCONTINUED] ezetimibe (ZETIA) 10 MG tablet TAKE 1 TABLET BY MOUTH DAILY     Immunizations Immunization History  Administered Date(s) Administered   Influenza Split 04/04/2013, 07/22/2013   Influenza, Seasonal, Injecte, Preservative Fre 04/20/2009   Influenza,inj,Quad PF,6+ Mos 03/26/2018, 04/19/2019, 04/05/2022   Influenza,inj,quad, With Preservative 07/22/2014   Influenza-Unspecified 07/22/2013, 05/21/2017, 03/26/2018   Janssen (J&J) SARS-COV-2 Vaccination 10/09/2019, 02/03/2021   PNEUMOCOCCAL CONJUGATE-20 10/28/2023    Tdap 07/22/2000, 01/12/2019   Zoster Recombinant(Shingrix) 02/02/2019, 04/05/2019     Screening Tests Health Maintenance  Topic Date Due   COVID-19 Vaccine (3 - 2024-25 season) 03/23/2023   Cervical Cancer Screening (HPV/Pap Cotest)  02/13/2024   INFLUENZA VACCINE  02/20/2024   Medicare Annual Wellness (AWV)  10/27/2024   MAMMOGRAM  12/25/2024   DTaP/Tdap/Td (3 - Td or Tdap) 01/11/2029   Colonoscopy  02/15/2030   Pneumococcal Vaccine 42-66 Years old  Completed   Hepatitis C Screening  Completed   HIV Screening  Completed   Zoster Vaccines- Shingrix  Completed   HPV VACCINES  Aged Out    Health Maintenance Screenings  Health Maintenance Topics with due status: Overdue     Topic Date Due   COVID-19 Vaccine 03/23/2023    RSV Vaccine: recommended  Past Medical History:  Diagnosis Date   Allergy    Anxiety    Bipolar 2 disorder (HCC)    Cervical polyp 12/16/2018   Depression    Former smoker 12/16/2018   Hyperlipidemia    Leukopenia    Mild aortic insufficiency    mild by echo 09/2022   Mild mitral regurgitation    by echo 07/2016   MS (multiple sclerosis) (HCC)    Neuromuscular disorder (HCC)    MS    Normal coronary arteries cath 2015   Overactive bladder    PVC's (premature ventricular contractions) 04/09/2016   SVT (supraventricular tachycardia) (  HCC) 04/22/2017   Vitamin D deficiency    Past Surgical History:  Procedure Laterality Date   BREAST BIOPSY Left 2002   CARPAL TUNNEL RELEASE     CHOLECYSTECTOMY     COLONOSCOPY  2010   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     pigmented squamous cell carcinoma removal     POLYPECTOMY  2010   1 polyp- rectum , no path    UPPER GASTROINTESTINAL ENDOSCOPY  2010   Family History  Problem Relation Age of Onset   Stroke Mother 62   Dementia Mother    Bipolar disorder Mother    Hypertension Brother    Hyperlipidemia Brother    Obesity Brother    Colon polyps Brother    Breast cancer Paternal Grandmother         over 30   Skin cancer Paternal Grandmother    Colon cancer Neg Hx    Esophageal cancer Neg Hx    Rectal cancer Neg Hx    Stomach cancer Neg Hx    Bladder Cancer Neg Hx    Uterine cancer Neg Hx    Social History   Socioeconomic History   Marital status: Single    Spouse name: Not on file   Number of children: Not on file   Years of education: Not on file   Highest education level: Not on file  Occupational History   Not on file  Tobacco Use   Smoking status: Former    Current packs/day: 0.00    Average packs/day: 1.5 packs/day for 20.0 years (30.0 ttl pk-yrs)    Types: Cigarettes    Start date: 10/28/1974    Quit date: 10/28/1994    Years since quitting: 29.0   Smokeless tobacco: Never  Vaping Use   Vaping status: Never Used  Substance and Sexual Activity   Alcohol use: Yes    Alcohol/week: 0.0 - 1.0 standard drinks of alcohol    Comment: occ   Drug use: No   Sexual activity: Not Currently    Partners: Male    Birth control/protection: Post-menopausal    Comment: since 2008  Other Topics Concern   Not on file  Social History Narrative   Not on file   Social Drivers of Health   Financial Resource Strain: Low Risk  (10/23/2022)   Overall Financial Resource Strain (CARDIA)    Difficulty of Paying Living Expenses: Not hard at all  Food Insecurity: No Food Insecurity (10/23/2022)   Hunger Vital Sign    Worried About Running Out of Food in the Last Year: Never true    Ran Out of Food in the Last Year: Never true  Transportation Needs: No Transportation Needs (10/23/2022)   PRAPARE - Administrator, Civil Service (Medical): No    Lack of Transportation (Non-Medical): No  Physical Activity: Insufficiently Active (10/23/2022)   Exercise Vital Sign    Days of Exercise per Week: 2 days    Minutes of Exercise per Session: 10 min  Stress: No Stress Concern Present (10/23/2022)   [employer] of Occupational Health - Occupational Stress Questionnaire     Feeling of Stress : Not at all  Social Connections: Moderately Integrated (10/23/2022)   Social Connection and Isolation Panel [NHANES]    Frequency of Communication with Friends and Family: Three times a week    Frequency of Social Gatherings with Friends and Family: Not on file    Attends Religious Services: More than 4 times per year  Active Member of Clubs or Organizations: Yes    Attends Banker Meetings: More than 4 times per year    Marital Status: Never married    Outpatient Encounter Medications as of 10/28/2023  Medication Sig   baclofen (LIORESAL) 10 MG tablet Take 10 mg by mouth 2 (two) times daily.   Biotin 5000 MCG CAPS Take by mouth.   cholecalciferol (VITAMIN D3) 25 MCG (1000 UNIT) tablet Take 1,000 Units by mouth daily.   Chromium Picolinate (CHROMIUM PICOLATE PO) Take by mouth.   clonazePAM (KLONOPIN) 0.5 MG tablet Take 1 tablet (0.5 mg total) by mouth daily as needed for anxiety.   estradiol (ESTRACE) 0.1 MG/GM vaginal cream Place 1g nightly for two weeks then twice a week after   ibuprofen (ADVIL) 800 MG tablet Take 1 tablet (800 mg total) by mouth every 8 (eight) hours as needed.   lurasidone (LATUDA) 40 MG TABS tablet Take 1 tablet (40 mg total) by mouth daily with supper.   metoprolol succinate (TOPROL XL) 50 MG 24 hr tablet Take 1 tablet (50 mg total) by mouth daily. Take with or immediately following a meal.   modafinil (PROVIGIL) 100 MG tablet Take 100 mg by mouth daily as needed (Driving).   nitrofurantoin, macrocrystal-monohydrate, (MACROBID) 100 MG capsule Take 1 capsule (100 mg total) by mouth 2 (two) times daily for 5 days.   NONFORMULARY OR COMPOUNDED ITEM Compounded progesterone SR 100mg .  1 capsule daily.   OXcarbazepine (TRILEPTAL) 150 MG tablet Take 1 tablet (150 mg total) by mouth 2 (two) times daily.   pregabalin (LYRICA) 50 MG capsule Take 50 mg by mouth 2 (two) times daily.   traZODone (DESYREL) 100 MG tablet Take 100 mg by mouth at  bedtime. Takes 1/2-1 tablet at bedtime as needed   venlafaxine XR (EFFEXOR-XR) 75 MG 24 hr capsule TAKE ONE CAPSULE BY MOUTH DAILY WITH BREAKFAST   Vibegron (GEMTESA) 75 MG TABS Take 1 tablet (75 mg total) by mouth daily.   [DISCONTINUED] ezetimibe (ZETIA) 10 MG tablet TAKE 1 TABLET BY MOUTH DAILY   ezetimibe (ZETIA) 10 MG tablet Take 1 tablet (10 mg total) by mouth daily.   [DISCONTINUED] lidocaine (XYLOCAINE) 5 % ointment Use 0.5g peasize up to 3 times a day as needed for discomfort and 10-10min prior to vaginal dilator use (Patient not taking: Reported on 10/28/2023)   No facility-administered encounter medications on file as of 10/28/2023.    Physical Exam: Physical Exam Vitals and nursing note reviewed.  Constitutional:      Appearance: Normal appearance. She is obese.  HENT:     Head: Normocephalic.     Right Ear: Tympanic membrane normal.     Left Ear: Tympanic membrane normal.     Nose: Nose normal.     Mouth/Throat:     Pharynx: Oropharynx is clear.  Eyes:     Conjunctiva/sclera: Conjunctivae normal.     Pupils: Pupils are equal, round, and reactive to light.  Neck:     Vascular: No carotid bruit.  Cardiovascular:     Rate and Rhythm: Normal rate and regular rhythm.     Pulses: Normal pulses.     Heart sounds: Normal heart sounds.  Pulmonary:     Effort: Pulmonary effort is normal.     Breath sounds: Normal breath sounds.  Abdominal:     General: Bowel sounds are normal. There is no distension.     Tenderness: There is abdominal tenderness. There is no right CVA tenderness, left  CVA tenderness, guarding or rebound.     Hernia: No hernia is present.  Musculoskeletal:     Cervical back: Normal range of motion. No tenderness.     Right lower leg: No edema.     Left lower leg: No edema.  Lymphadenopathy:     Cervical: No cervical adenopathy.  Skin:    General: Skin is warm and dry.     Capillary Refill: Capillary refill takes less than 2 seconds.  Neurological:      Mental Status: She is alert and oriented to person, place, and time.     Motor: Weakness present.  Psychiatric:        Mood and Affect: Mood normal.        Behavior: Behavior normal.     PLAN  Exercise Activities and Dietary Recommendations - keep track of how long I exercise - keep track of how often I exercise Cardiac diet  Fall Prevention - always use handrails on the stairs - always wear shoes or slippers with non-slip sole - install bathroom grab bars - keep a flashlight by the bed - keep cell phone with me always - remove, or use a non-slip pad, with my throw rugs  Orders Placed This Encounter  Procedures   Pneumococcal conjugate vaccine 20-valent (Prevnar 20)   Hemoglobin A1c    Release to patient:   Immediate   CBC with Differential/Platelet    Release to patient:   Immediate   Comprehensive metabolic panel with GFR    Has the patient fasted?:   Yes    Release to patient:   Immediate   Lipid panel    Has the patient fasted?:   Yes    Release to patient:   Immediate   VITAMIN D 25 Hydroxy (Vit-D Deficiency, Fractures)    Release to patient:   Immediate     I have personally reviewed and noted the following in the patient's chart:   Medical and social history Use of alcohol, tobacco or illicit drugs  Current medications and supplements Functional ability and status Nutritional status Physical activity Advanced directives List of other physicians Hospitalizations, surgeries, and ER visits in previous 12 months Vitals Screenings to include cognitive, depression, and falls Referrals and appointments  In addition, I have reviewed and discussed with patient certain preventive protocols, quality metrics, and best practice recommendations. A written personalized care plan for preventive services as well as general preventive health recommendations were provided to patient.   Tollie Eth, NP  10/28/2023

## 2023-10-29 ENCOUNTER — Ambulatory Visit (INDEPENDENT_AMBULATORY_CARE_PROVIDER_SITE_OTHER): Admitting: Obstetrics and Gynecology

## 2023-10-29 ENCOUNTER — Other Ambulatory Visit (HOSPITAL_COMMUNITY)
Admission: RE | Admit: 2023-10-29 | Discharge: 2023-10-29 | Disposition: A | Discharge status: Home / Self Care | Attending: Obstetrics and Gynecology | Admitting: Obstetrics and Gynecology

## 2023-10-29 ENCOUNTER — Encounter: Payer: Self-pay | Admitting: Obstetrics and Gynecology

## 2023-10-29 ENCOUNTER — Encounter: Payer: Self-pay | Admitting: Nurse Practitioner

## 2023-10-29 VITALS — BP 114/73 | HR 62

## 2023-10-29 DIAGNOSIS — N393 Stress incontinence (female) (male): Secondary | ICD-10-CM

## 2023-10-29 DIAGNOSIS — R948 Abnormal results of function studies of other organs and systems: Secondary | ICD-10-CM

## 2023-10-29 DIAGNOSIS — R339 Retention of urine, unspecified: Secondary | ICD-10-CM

## 2023-10-29 DIAGNOSIS — R350 Frequency of micturition: Secondary | ICD-10-CM | POA: Diagnosis not present

## 2023-10-29 DIAGNOSIS — R319 Hematuria, unspecified: Secondary | ICD-10-CM

## 2023-10-29 LAB — CBC WITH DIFFERENTIAL/PLATELET
Basophils Absolute: 0 10*3/uL (ref 0.0–0.2)
Basos: 0 %
EOS (ABSOLUTE): 0.1 10*3/uL (ref 0.0–0.4)
Eos: 2 %
Hematocrit: 40.4 % (ref 34.0–46.6)
Hemoglobin: 13.4 g/dL (ref 11.1–15.9)
Immature Grans (Abs): 0 10*3/uL (ref 0.0–0.1)
Immature Granulocytes: 0 %
Lymphocytes Absolute: 1.2 10*3/uL (ref 0.7–3.1)
Lymphs: 26 %
MCH: 28.9 pg (ref 26.6–33.0)
MCHC: 33.2 g/dL (ref 31.5–35.7)
MCV: 87 fL (ref 79–97)
Monocytes Absolute: 0.4 10*3/uL (ref 0.1–0.9)
Monocytes: 8 %
Neutrophils Absolute: 3 10*3/uL (ref 1.4–7.0)
Neutrophils: 64 %
Platelets: 196 10*3/uL (ref 150–450)
RBC: 4.63 x10E6/uL (ref 3.77–5.28)
RDW: 12.4 % (ref 11.7–15.4)
WBC: 4.7 10*3/uL (ref 3.4–10.8)

## 2023-10-29 LAB — POCT URINALYSIS DIPSTICK
Bilirubin, UA: NEGATIVE
Glucose, UA: NEGATIVE
Ketones, UA: NEGATIVE
Leukocytes, UA: NEGATIVE
Nitrite, UA: NEGATIVE
Protein, UA: NEGATIVE
Spec Grav, UA: 1.015 (ref 1.010–1.025)
Urobilinogen, UA: 0.2 U/dL
pH, UA: 7 (ref 5.0–8.0)

## 2023-10-29 LAB — COMPREHENSIVE METABOLIC PANEL WITH GFR
ALT: 17 IU/L (ref 0–32)
AST: 18 IU/L (ref 0–40)
Albumin: 4.4 g/dL (ref 3.9–4.9)
Alkaline Phosphatase: 63 IU/L (ref 44–121)
BUN/Creatinine Ratio: 11 — ABNORMAL LOW (ref 12–28)
BUN: 12 mg/dL (ref 8–27)
Bilirubin Total: 0.3 mg/dL (ref 0.0–1.2)
CO2: 25 mmol/L (ref 20–29)
Calcium: 9.3 mg/dL (ref 8.7–10.3)
Chloride: 104 mmol/L (ref 96–106)
Creatinine, Ser: 1.1 mg/dL — ABNORMAL HIGH (ref 0.57–1.00)
Globulin, Total: 2.3 g/dL (ref 1.5–4.5)
Glucose: 89 mg/dL (ref 70–99)
Potassium: 4.5 mmol/L (ref 3.5–5.2)
Sodium: 142 mmol/L (ref 134–144)
Total Protein: 6.7 g/dL (ref 6.0–8.5)
eGFR: 56 mL/min/{1.73_m2} — ABNORMAL LOW (ref >59)

## 2023-10-29 LAB — URINALYSIS, ROUTINE W REFLEX MICROSCOPIC
Bilirubin Urine: NEGATIVE
Glucose, UA: NEGATIVE mg/dL
Ketones, ur: NEGATIVE mg/dL
Leukocytes,Ua: NEGATIVE
Nitrite: NEGATIVE
Protein, ur: NEGATIVE mg/dL
Specific Gravity, Urine: 1.014 (ref 1.005–1.030)
pH: 6 (ref 5.0–8.0)

## 2023-10-29 LAB — LIPID PANEL
Cholesterol, Total: 182 mg/dL (ref 100–199)
HDL: 52 mg/dL (ref >39)
LDL CALC COMMENT:: 3.5 ratio (ref 0.0–4.4)
LDL Chol Calc (NIH): 104 mg/dL — ABNORMAL HIGH (ref 0–99)
Triglycerides: 146 mg/dL (ref 0–149)
VLDL Cholesterol Cal: 26 mg/dL (ref 5–40)

## 2023-10-29 LAB — HEMOGLOBIN A1C
Est. average glucose Bld gHb Est-mCnc: 103 mg/dL
Hgb A1c MFr Bld: 5.2 % (ref 4.8–5.6)

## 2023-10-29 LAB — VITAMIN D 25 HYDROXY (VIT D DEFICIENCY, FRACTURES): Vit D, 25-Hydroxy: 33.8 ng/mL (ref 30.0–100.0)

## 2023-10-29 NOTE — Progress Notes (Signed)
 [facility] Urogyne[facility] Urodynamics Procedure  Referring Physician: Tollie Eth, NP Date of Procedure: 10/29/2023  Sarah Hogan is a 63 y.o. female who presents for urodynamic evaluation. Indication(s) for study: mixed incontinence  Vital Signs: LMP 06/28/2008   Laboratory Results: A catheterized urine specimen revealed:  POC urine:  Lab Results  Component Value Date   COLORU yellow 10/29/2023   CLARITYU clear 10/29/2023   GLUCOSEUR Negative 10/29/2023   BILIRUBINUR negative 10/29/2023   KETONESU negative 10/29/2023   SPECGRAV 1.015 10/29/2023   RBCUR Small 10/29/2023   PHUR 7.0 10/29/2023   PROTEINUR Negative 10/29/2023   UROBILINOGEN 0.2 10/29/2023   LEUKOCYTESUR Negative 10/29/2023     Voiding Diary: Deferred   Procedure Timeout:  The correct patient was verified and the correct procedure was verified. The patient was in the correct position and safety precautions were reviewed based on at the patient's history.  Urodynamic Procedure A 1F dual lumen urodynamics catheter was placed under sterile conditions into the patient's bladder. A 1F catheter was placed into the rectum in order to measure abdominal pressure. EMG patches were placed in the appropriate position.  All connections were confirmed and calibrations/adjusted made. Saline was instilled into the bladder through the dual lumen catheters.  Cough/valsalva pressures were measured periodically during filling.  Patient was allowed to void.  The bladder was then emptied of its residual.  UROFLOW: Patient was unable to void.Cathed PVR was .   CMG: This was performed with sterile water in the sitting position at a fill rate of 30 mL/min.    First sensation of fullness was 200 mLs,  First urge was 330 mLs,  Strong urge was 364 mLs and  Capacity was 478 mLs  Stress incontinence was demonstrated Highest positive CLPP was 93 cmH20 at 405 ml. Highest positive VLPP was 99 cmH20at 406 ml.    Detrusor  function was underactive, with no phasic contractions seen.     Compliance:  Normal. End fill detrusor pressure was 0cmH20.    UPP: MUCP without barrier reduction was 65 cm of water.    MICTURITION STUDY: Voiding was performed without reduction in the sitting position.  Pdet at Qmax was 0 cm of water.  Qmax was 8.4 mL/sec.  It was a highly prolonged pattern.  She voided 167 mL and had a residual of 400 mL.  It was a volitional void, sustained detrusor contraction was not present and abdominal straining was present  EMG: This was performed with patches.  She had voluntary contractions, recruitment with fill was not present and urethral sphincter was relaxed with void.  The details of the procedure with the study tracings have been scanned into EPIC.   Urodynamic Impression:  1. Sensation was reduced; capacity was normal 2. Stress Incontinence was demonstrated at normal pressures; 3. Detrusor Overactivity was not demonstrated. 4. Emptying was dysfunctional with a elevated PVR, a sustained detrusor contraction not present,  abdominal straining present, normal urethral sphincter activity on EMG.  Plan: - The patient will follow up  to discuss the findings and treatment options.

## 2023-11-10 NOTE — Progress Notes (Signed)
 [facility] Return Visit  SUBJECTIVE  History of Present Illness: Sarah Hogan is a 63 y.o. female seen in follow-up for mixed urinary incontinence with multiple sclerosis, nocturia, recurrent UTI, fecal incontinence, slow transit constipation, elevated Cr, and high tone pelvic floor dysfunction. Plan at last visit was miralax titration, renal ultrasound, vaginal estrogen, referral to pelvic floor PT, repeat creatinine, topical lidocaine  with dilator use.   Reports worsening leakage yesterday and fecal leakage attributed to allergies. Started miralax titrated to 1.5 capful/day Previously UTI like symptoms with vaginal estrogen use, resolved 1 week ago.  Pelvic floor PT appt pending 12/23/23  - UDS in 2019 with increased sensation, decreased MCC, DOI and no SUI. Elevated Pdet at Qmax 36 and low flow with Qmax at 6 after removal of catheter. PVR 74mL with catheter removed. Synergic EMG. 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  Urodynamic Impression 10/29/23:  1. Sensation was reduced; capacity was normal at 2. Stress Incontinence was demonstrated at normal pressures; MUCP 65cmH2O 3. Detrusor Overactivity was not demonstrated. 4. Emptying was dysfunctional with a elevated PVR , a sustained detrusor contraction not present,  abdominal straining present, normal urethral sphincter activity with synergic EMG. Low flow with Qmax 40mL/sec, Pdet at Qmax 0.   - tried pelvic floor PT, Oxybutynin , mirabegron  25mg , and possible PTNS in the past   Urine testing: - 10/14/23 UA + heme, 6-10 RBCs/hpf with pending cystoscopy 11/27/23 - last treated for UTI with lower abdominal pain and dysuria with Macrobid  on 10/23/23. UA + heme/leuk, microscopy + heme. Culture 60K pansensitive Citrobacter Freundii.   - Cr 1.01 on 10/22/22, 1.1 on 10/28/23 - 10/29/23 UA + heme, 6-10 RBC/hpf   Renal US  10/22/23 "CLINICAL DATA:  Hematuria.   EXAM: RENAL / URINARY TRACT ULTRASOUND COMPLETE   COMPARISON:  None  Available.   FINDINGS: Right Kidney:   Renal measurements: 10.3 x 4.6 x 5.4 cm = volume: 132.6 mL. Echogenicity within normal limits. No hydronephrosis visualized. There is a 2.3 x 1.6 x 1.8 cm simple cysts. No follow-up is recommended.   Left Kidney:   Renal measurements: 10.8 x 4.5 x 5.8 cm = volume: 146.7 mL. Echogenicity within normal limits. No hydronephrosis visualized. There is a 1.4 x 1.5 x 1.4 cm simple cyst. No follow-up is recommended.   Bladder:   Appears normal for degree of bladder distention.   Other:   None.   IMPRESSION: No acute abnormality identified.     Electronically Signed   By: Sarah Hogan M.D.   On: 10/22/2023 14:40"  Past Medical History: Patient  has a past medical history of Allergy, Anxiety, Bipolar 2 disorder (HCC), Cervical polyp (12/16/2018), Depression, Former smoker (12/16/2018), Hyperlipidemia, Leukopenia, Mild aortic insufficiency, Mild mitral regurgitation, MS (multiple sclerosis) (HCC), Neuromuscular disorder (HCC), Normal coronary arteries (cath 2015), Overactive bladder, PVC's (premature ventricular contractions) (04/09/2016), SVT (supraventricular tachycardia) (HCC) (04/22/2017), and Vitamin D  deficiency.   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   Medications: She has a current medication list which includes the following prescription(s): baclofen, biotin, cholecalciferol, chromium picolinate, clonazepam , estradiol , ezetimibe , ibuprofen , lurasidone , metoprolol  succinate, modafinil , NONFORMULARY OR COMPOUNDED ITEM, oxcarbazepine , pregabalin, trazodone , venlafaxine  xr, and gemtesa .   Allergies: Patient is allergic to amoxicillin and statins.   Social History: Patient   reports that she quit smoking  about 29 years ago. Her smoking use included cigarettes. She started smoking about 49 years ago. She has a 30 pack-year smoking history. She has never used smokeless tobacco. She reports current alcohol use. She reports that she does not use drugs.     OBJECTIVE     Physical Exam: Vitals:   11/11/23 1404  BP: 112/63  Pulse: (!) 59   Gen: No apparent distress, A&O x 3.  Detailed Urogynecologic Evaluation:  Deferred.   CIC teaching: Reviewed anatomy, handwashing, supplies needed for CIC. After verbal consent was obtained from the patient for catheterization prior to bladder botox injection. Patient performed an in and out catheterization with assistance of half a speculum.  The patient tolerated the procedure well.  Straight Catheterization Procedure for PVR: After verbal consent was obtained from the patient for catheterization to assess bladder emptying and residual volume the urethra and surrounding tissues were prepped with betadine and an in and out catheterization was performed.  PVR was .  Urine appeared clear yellow. The patient tolerated the procedure well.    Lab Results  Component Value Date   COLORU yellow 11/11/2023   CLARITYU cloudy 11/11/2023   GLUCOSEUR Negative 11/11/2023   BILIRUBINUR Negative 11/11/2023   KETONESU Negative 11/11/2023   SPECGRAV 1.015 11/11/2023   RBCUR moderate 11/11/2023   PHUR 7.0 11/11/2023   PROTEINUR Negative 11/11/2023   UROBILINOGEN 0.2 11/11/2023   LEUKOCYTESUR Large (3+) (A) 11/11/2023      ASSESSMENT AND PLAN    Sarah Hogan is a 63 y.o. with:  1. Incomplete bladder emptying   2. MS (multiple sclerosis) (HCC)   3. High-tone pelvic floor dysfunction   4. Elevated serum creatinine   5. Mixed stress and urge urinary incontinence   6. Incontinence of feces, unspecified fecal incontinence type   7. Recurrent UTI   8. Abnormal urinalysis   9. Other microscopic hematuria   10. Slow transit  constipation     Incomplete bladder emptying Assessment & Plan: - UDS 10/29/23 with elevated PVR and overflow urinary incontinence with no detrusor pressure and valsalva void with low flow Qmax 39mL/sec. Synergic EMG - prior UDS 2019 with high pressure Pdet at Qmax 36 and low flow with Qmax at 6 after removal of catheter. PVR 74mL with synergic EMG.  - discussed options for bladder emptying including indwelling foley, CIC, suprapubic catheter, SNM or diversion/augmentation - patient reports prior CIC teaching with no postvoid residual, patient reports difficulty with CIC and performed by palpation due to difficulty with visualization. Reviewed difference in sensation with intraurethral vs. Vaginal catheter placement - pt performed CIC in the office with some assistance. Provided catheter supplies, urine collection container, and bladder diary to assess need for CIC and frequency.    MS (multiple sclerosis) (HCC) Assessment & Plan: - discussed risk of neurogenic bladder and refractory OAB symptoms - UDS 10/29/23 with low flow, valsalva void and synergic EMG with elevated PVR.  - renal US  negative 10/22/23 - UDS in 2019 with increased sensation, decreased MCC, DOI and no SUI. Elevated Pdet at Qmax 36 and low flow with Qmax at 6 after removal of catheter. PVR 74mL with catheter removed. 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 Resume CIC with diary and supplies provided.   High-tone pelvic floor dysfunction Assessment & Plan: - prior pelvic floor PT with Kegel  exercises and reported increased discomfort and frequency of UTIs - pending pelvic floor PT appt on 12/23/23 discussed need for pelvic floor relaxation and coordination due to pain and discomfort reproduced on pelvic exam - The origin of pelvic floor muscle spasm can be multifactorial,  including primary, reactive to a different pain source, trauma, or even part of a centralized pain syndrome.Treatment options include pelvic floor physical therapy, local (vaginal) or oral  muscle relaxants, pelvic muscle trigger point injections or centrally acting pain medications.   - provided samples of vaginal lubrication with topical lidocaine  for comfort if patient desires to resume vaginal dilator use - encouraged topical lidocaine  prior to pelvic floor PT   Elevated serum creatinine Assessment & Plan: - discussed need for annual Cr in the setting of MS and neurogenic bladder - last Cr 1.01 on 10/22/22, repeat 1.1 on 10/28/23 with PCP  - renal US  WNL on 10/22/23    Mixed stress and urge urinary incontinence Assessment & Plan: - tried pelvic floor PT, Oxybutynin , mirabegron  25mg , and possible PTNS in the past  - We discussed the symptoms of overactive bladder (OAB), which include urinary urgency, urinary frequency, nocturia, with or without urge incontinence.  While we do not know the exact etiology of OAB, several treatment options exist. We discussed management including behavioral therapy (decreasing bladder irritants, urge suppression strategies, timed voids, bladder retraining), physical therapy, medication; for refractory cases posterior tibial nerve stimulation, sacral neuromodulation, and intravesical botulinum toxin injection.  For anticholinergic medications, we discussed the potential side effects of anticholinergics including dry eyes, dry mouth, constipation, cognitive impairment and urinary retention. For Beta-3 agonist medication, we discussed the potential side effect of elevated blood pressure which is more likely to occur in individuals with uncontrolled hypertension. - discussed UDS 10/29/23 finding of Pdet at Qmax of 0 and valsalva void with low flow.  - reassess symptoms after CIC and assess bladder emptying.  - discussed SNM due to non-obstructive urinary retention  noted   Incontinence of feces, unspecified fecal incontinence type Assessment & Plan: - possibly multifactorial due to constipation, high tone pelvic floor, MS, and peripheral neuropathy - Treatment options include anti-diarrhea medication (loperamide/ Imodium OTC or prescription lomotil), fiber supplements, physical therapy, and possible sacral neuromodulation or surgery.   - continue titration of miralax - pending to start pelvic floor PT - reviewed SNM and possible treatment for bother fecal and urinary incontinence symptoms   Recurrent UTI Assessment & Plan: - For treatment of recurrent urinary tract infections, we discussed management of recurrent UTIs including prophylaxis with a daily low dose antibiotic, transvaginal estrogen therapy, D-mannose, and cranberry supplements.  We discussed the role of diagnostic testing such as cystoscopy and upper tract imaging.   - patient to continue low dose vaginal estrogen - denies pyelonephritis or history of kidney stone - renal US  WNL 10/22/23 - Cr 1.1 on 10/28/23  Orders: -     POCT urinalysis dipstick  Abnormal urinalysis Assessment & Plan: - POCT UA + heme, catheterized urine microscopy 6-10 RBCs/hpf - denies gross hematuria outside of UTI - repeat catheterized POCT UA + leuk/heme. Pending UA microscopy and culture - continue low dose vaginal estrogen  Orders: -     Urine Culture; Future  Other microscopic hematuria Assessment & Plan: - clean catch 10/29/23 POCT UA + heme with 6-10 RBC/hpf. Started vaginal estrogen - repeat catheterized POCT UA +  heme, pending microscopy and culture - For management of microscopic hematuria, we discussed the importance of work-up including  assessing the upper and lower GU tract with CT urogram and cystoscopy. We will also check her creatinine today if there is not a recent value in the system. She will pursue this work-up and follow-up afterward to discuss the results and decide on a treatment plan  based on the findings.  - will need to schedule cystoscopy if positive  Orders: -     Urine Microscopic; Future  Slow transit constipation Assessment & Plan: - since 92s - discussed association with pelvic floor disorders and urinary incontinence - For constipation, we reviewed the importance of a better bowel regimen.  We also discussed the importance of avoiding chronic straining, as it can exacerbate her pelvic floor symptoms; we discussed treating constipation and straining prior to surgery, as postoperative straining can lead to damage to the repair and recurrence of symptoms. We discussed initiating therapy with increasing fluid intake, fiber supplementation, stool softeners, and laxatives such as miralax.  - continue titration of miralax and squatting position for pelvic floor relaxation during defecation - prior use of fiber supplementation with bloating   Time spent: I spent 55 minutes dedicated to the care of this patient on the date of this encounter to include pre-visit review of records, face-to-face time with the patient discussing neurogenic bladder with incomplete bladder emptying, mixed urinary incontinence, abnormal urinalysis, slow transit constipation, fecal incontinence, high tone pelvic floor, elevated creatinine, vaginal atrophy, and post visit documentation and ordering medication/ testing.   Darlene Ehlers, MD

## 2023-11-11 ENCOUNTER — Ambulatory Visit (INDEPENDENT_AMBULATORY_CARE_PROVIDER_SITE_OTHER): Admitting: Obstetrics

## 2023-11-11 ENCOUNTER — Encounter: Payer: Self-pay | Admitting: Obstetrics

## 2023-11-11 ENCOUNTER — Other Ambulatory Visit (HOSPITAL_COMMUNITY)
Admission: RE | Admit: 2023-11-11 | Discharge: 2023-11-11 | Disposition: A | Discharge status: Home / Self Care | Source: Other Acute Inpatient Hospital | Attending: Obstetrics | Admitting: Obstetrics

## 2023-11-11 VITALS — BP 112/63 | HR 59

## 2023-11-11 DIAGNOSIS — R3129 Other microscopic hematuria: Secondary | ICD-10-CM

## 2023-11-11 DIAGNOSIS — R159 Full incontinence of feces: Secondary | ICD-10-CM

## 2023-11-11 DIAGNOSIS — R339 Retention of urine, unspecified: Secondary | ICD-10-CM | POA: Insufficient documentation

## 2023-11-11 DIAGNOSIS — K5901 Slow transit constipation: Secondary | ICD-10-CM

## 2023-11-11 DIAGNOSIS — R7989 Other specified abnormal findings of blood chemistry: Secondary | ICD-10-CM

## 2023-11-11 DIAGNOSIS — M6289 Other specified disorders of muscle: Secondary | ICD-10-CM | POA: Diagnosis not present

## 2023-11-11 DIAGNOSIS — Z87440 Personal history of urinary (tract) infections: Secondary | ICD-10-CM | POA: Diagnosis not present

## 2023-11-11 DIAGNOSIS — R8290 Unspecified abnormal findings in urine: Secondary | ICD-10-CM

## 2023-11-11 DIAGNOSIS — N3946 Mixed incontinence: Secondary | ICD-10-CM | POA: Diagnosis not present

## 2023-11-11 DIAGNOSIS — G35 Multiple sclerosis: Secondary | ICD-10-CM

## 2023-11-11 DIAGNOSIS — N390 Urinary tract infection, site not specified: Secondary | ICD-10-CM

## 2023-11-11 LAB — POCT URINALYSIS DIPSTICK
Bilirubin, UA: NEGATIVE
Glucose, UA: NEGATIVE
Ketones, UA: NEGATIVE
Nitrite, UA: NEGATIVE
Protein, UA: NEGATIVE
Spec Grav, UA: 1.015 (ref 1.010–1.025)
Urobilinogen, UA: 0.2 U/dL
pH, UA: 7 (ref 5.0–8.0)

## 2023-11-11 LAB — URINALYSIS, ROUTINE W REFLEX MICROSCOPIC
Bilirubin Urine: NEGATIVE
Glucose, UA: NEGATIVE mg/dL
Ketones, ur: NEGATIVE mg/dL
Nitrite: POSITIVE — AB
Protein, ur: NEGATIVE mg/dL
Specific Gravity, Urine: 1.011 (ref 1.005–1.030)
WBC, UA: >50 WBC/hpf (ref 0–5)
pH: 7 (ref 5.0–8.0)

## 2023-11-11 NOTE — Assessment & Plan Note (Signed)
-   tried pelvic floor PT, Oxybutynin , mirabegron  25mg , and possible PTNS in the past  - We discussed the symptoms of overactive bladder (OAB), which include urinary urgency, urinary frequency, nocturia, with or without urge incontinence.  While we do not know the exact etiology of OAB, several treatment options exist. We discussed management including behavioral therapy (decreasing bladder irritants, urge suppression strategies, timed voids, bladder retraining), physical therapy, medication; for refractory cases posterior tibial nerve stimulation, sacral neuromodulation, and intravesical botulinum toxin injection.  For anticholinergic medications, we discussed the potential side effects of anticholinergics including dry eyes, dry mouth, constipation, cognitive impairment and urinary retention. For Beta-3 agonist medication, we discussed the potential side effect of elevated blood pressure which is more likely to occur in individuals with uncontrolled hypertension. - discussed UDS 10/29/23 finding of Pdet at Qmax of 0 and valsalva void with low flow.  - reassess symptoms after CIC and assess bladder emptying.  - discussed SNM due to non-obstructive urinary retention noted

## 2023-11-11 NOTE — Assessment & Plan Note (Signed)
-   prior pelvic floor PT with Kegel exercises and reported increased discomfort and frequency of UTIs - pending pelvic floor PT appt on 12/23/23 discussed need for pelvic floor relaxation and coordination due to pain and discomfort reproduced on pelvic exam - The origin of pelvic floor muscle spasm can be multifactorial, including primary, reactive to a different pain source, trauma, or even part of a centralized pain syndrome.Treatment options include pelvic floor physical therapy, local (vaginal) or oral  muscle relaxants, pelvic muscle trigger point injections or centrally acting pain medications.   - provided samples of vaginal lubrication with topical lidocaine  for comfort if patient desires to resume vaginal dilator use - encouraged topical lidocaine  prior to pelvic floor PT

## 2023-11-11 NOTE — Assessment & Plan Note (Signed)
-   since 75s - discussed association with pelvic floor disorders and urinary incontinence - For constipation, we reviewed the importance of a better bowel regimen.  We also discussed the importance of avoiding chronic straining, as it can exacerbate her pelvic floor symptoms; we discussed treating constipation and straining prior to surgery, as postoperative straining can lead to damage to the repair and recurrence of symptoms. We discussed initiating therapy with increasing fluid intake, fiber supplementation, stool softeners, and laxatives such as miralax.  - continue titration of miralax and squatting position for pelvic floor relaxation during defecation - prior use of fiber supplementation with bloating

## 2023-11-11 NOTE — Assessment & Plan Note (Signed)
-   possibly multifactorial due to constipation, high tone pelvic floor, MS, and peripheral neuropathy - Treatment options include anti-diarrhea medication (loperamide/ Imodium OTC or prescription lomotil), fiber supplements, physical therapy, and possible sacral neuromodulation or surgery.   - continue titration of miralax - pending to start pelvic floor PT - reviewed SNM and possible treatment for bother fecal and urinary incontinence symptoms

## 2023-11-11 NOTE — Assessment & Plan Note (Addendum)
-   UDS 10/29/23 with elevated PVR and overflow urinary incontinence with no detrusor pressure and valsalva void with low flow Qmax 60mL/sec. Synergic EMG - prior UDS 2019 with high pressure Pdet at Qmax 36 and low flow with Qmax at 6 after removal of catheter. PVR 74mL with synergic EMG.  - discussed options for bladder emptying including indwelling foley, CIC, suprapubic catheter, SNM or diversion/augmentation - patient reports prior CIC teaching with no postvoid residual, patient reports difficulty with CIC and performed by palpation due to difficulty with visualization. Reviewed difference in sensation with intraurethral vs. Vaginal catheter placement - pt performed CIC in the office with some assistance. Provided catheter supplies, urine collection container, and bladder diary to assess need for CIC and frequency.

## 2023-11-11 NOTE — Assessment & Plan Note (Signed)
-   discussed need for annual Cr in the setting of MS and neurogenic bladder - last Cr 1.01 on 10/22/22, repeat 1.1 on 10/28/23 with PCP  - renal US  WNL on 10/22/23

## 2023-11-11 NOTE — Assessment & Plan Note (Signed)
-   discussed risk of neurogenic bladder and refractory OAB symptoms - UDS 10/29/23 with low flow, valsalva void and synergic EMG with elevated PVR.  - renal US  negative 10/22/23 - UDS in 2019 with increased sensation, decreased MCC, DOI and no SUI. Elevated Pdet at Qmax 36 and low flow with Qmax at 6 after removal of catheter. PVR 74mL with catheter removed. 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 Resume CIC with diary and supplies provided.

## 2023-11-11 NOTE — Assessment & Plan Note (Addendum)
-   POCT UA + heme, catheterized urine microscopy 6-10 RBCs/hpf - denies gross hematuria outside of UTI - repeat catheterized POCT UA + leuk/heme. Pending UA microscopy and culture - continue low dose vaginal estrogen

## 2023-11-11 NOTE — Assessment & Plan Note (Signed)
-   clean catch 10/29/23 POCT UA + heme with 6-10 RBC/hpf. Started vaginal estrogen - repeat catheterized POCT UA +  heme, pending microscopy and culture - For management of microscopic hematuria, we discussed the importance of work-up including assessing the upper and lower GU tract with CT urogram and cystoscopy. We will also check her creatinine today if there is not a recent value in the system. She will pursue this work-up and follow-up afterward to discuss the results and decide on a treatment plan based on the findings.  - will need to schedule cystoscopy if positive

## 2023-11-11 NOTE — Assessment & Plan Note (Signed)
-   For treatment of recurrent urinary tract infections, we discussed management of recurrent UTIs including prophylaxis with a daily low dose antibiotic, transvaginal estrogen therapy, D-mannose, and cranberry supplements.  We discussed the role of diagnostic testing such as cystoscopy and upper tract imaging.   - patient to continue low dose vaginal estrogen - denies pyelonephritis or history of kidney stone - renal US  WNL 10/22/23 - Cr 1.1 on 10/28/23

## 2023-11-11 NOTE — Patient Instructions (Addendum)
  Start in and out catheterization and record void volume and postvoid catheter volume to reassess symptoms.    Please call if you experience any UTI symptoms.   We discussed the role of InterStim sacral neuromodulation and how it works. It requires a test phase, and documentation of bladder function via diary. After a successful test period, a permanent wire and generator are placed in the OR. The battery lasts 5-7 years on average and would need to be replaced surgically but there is also a rechargeable option.  The goal of this therapy is at least a 50% improvement in symptoms. It is NOT realistic to expect a 100% cure.  We reviewed the fact that about 30% of patients fail the test phase and are not candidates for permanent generator placement.  We discussed the risk of infection and that the patient would be able to get an MRI once the device is placed.

## 2023-11-13 ENCOUNTER — Encounter: Payer: Self-pay | Admitting: Obstetrics

## 2023-11-13 LAB — URINE CULTURE: Culture: >=100000 — AB

## 2023-11-13 MED ORDER — NITROFURANTOIN MONOHYD MACRO 100 MG PO CAPS: 100.0000 mg | ORAL_CAPSULE | Freq: Two times a day (BID) | ORAL | 0 refills | Status: AC

## 2023-11-13 NOTE — Addendum Note (Signed)
 Addended byWyonia Hefty T on: 11/13/2023 12:54 PM   Modules accepted: Orders

## 2023-11-21 ENCOUNTER — Ambulatory Visit (HOSPITAL_COMMUNITY): Attending: Cardiology

## 2023-11-21 DIAGNOSIS — I351 Nonrheumatic aortic (valve) insufficiency: Secondary | ICD-10-CM | POA: Diagnosis not present

## 2023-11-21 LAB — ECHOCARDIOGRAM COMPLETE
Area-P 1/2: 3.19 cm2
P 1/2 time: 612 ms
S' Lateral: 2.6 cm

## 2023-11-24 ENCOUNTER — Encounter: Payer: Self-pay | Admitting: Cardiology

## 2023-11-24 ENCOUNTER — Telehealth: Payer: Self-pay

## 2023-11-24 DIAGNOSIS — I340 Nonrheumatic mitral (valve) insufficiency: Secondary | ICD-10-CM

## 2023-11-24 DIAGNOSIS — Z008 Encounter for other general examination: Secondary | ICD-10-CM | POA: Diagnosis not present

## 2023-11-24 DIAGNOSIS — G43909 Migraine, unspecified, not intractable, without status migrainosus: Secondary | ICD-10-CM | POA: Diagnosis not present

## 2023-11-24 DIAGNOSIS — E785 Hyperlipidemia, unspecified: Secondary | ICD-10-CM | POA: Diagnosis not present

## 2023-11-24 DIAGNOSIS — I351 Nonrheumatic aortic (valve) insufficiency: Secondary | ICD-10-CM | POA: Insufficient documentation

## 2023-11-24 DIAGNOSIS — G4700 Insomnia, unspecified: Secondary | ICD-10-CM | POA: Diagnosis not present

## 2023-11-24 DIAGNOSIS — Z6837 Body mass index (BMI) 37.0-37.9, adult: Secondary | ICD-10-CM | POA: Diagnosis not present

## 2023-11-24 DIAGNOSIS — N3281 Overactive bladder: Secondary | ICD-10-CM | POA: Diagnosis not present

## 2023-11-24 NOTE — Telephone Encounter (Signed)
 Spoke with pt regarding her results and a repeat echo in one year. An echo was ordered for 1 year out. Pt verbalized understanding. All questions if any were answered.

## 2023-11-24 NOTE — Telephone Encounter (Signed)
-----   Message from Gaylyn Keas sent at 11/24/2023  9:54 AM EDT ----- Echo showed normal heart function EF 60 to 65% with increase stiffness of the heart muscle called diastolic dysfunction.  There was mild leakiness of the mitral valve.  Mild to moderate leakiness of the aortic valve.  Please repeat an echo in 1 year for AI and MR

## 2023-11-27 ENCOUNTER — Other Ambulatory Visit: Admitting: Obstetrics

## 2023-11-27 DIAGNOSIS — G35 Multiple sclerosis: Secondary | ICD-10-CM | POA: Diagnosis not present

## 2023-12-02 ENCOUNTER — Ambulatory Visit: Attending: Obstetrics | Admitting: Physical Therapy

## 2023-12-02 ENCOUNTER — Other Ambulatory Visit: Payer: Self-pay

## 2023-12-02 ENCOUNTER — Encounter: Payer: Self-pay | Admitting: Physical Therapy

## 2023-12-02 DIAGNOSIS — R279 Unspecified lack of coordination: Secondary | ICD-10-CM | POA: Insufficient documentation

## 2023-12-02 DIAGNOSIS — M6281 Muscle weakness (generalized): Secondary | ICD-10-CM | POA: Diagnosis not present

## 2023-12-02 NOTE — Therapy (Signed)
 OUTPATIENT PHYSICAL THERAPY FEMALE PELVIC EVALUATION   Patient Name: Sarah Hogan MRN: [id number] DOB:06-27-1961, 63 y.o., female Today's Date: 12/02/2023  END OF SESSION:  PT End of Session - 12/02/23 1625     Visit Number 1    Date for PT Re-Evaluation 06/03/24    Authorization Type [employer] - [place]    PT Start Time 1400    PT Stop Time 1445    PT Time Calculation (min) 45 min    Activity Tolerance Patient tolerated treatment well    Behavior During Therapy [facility] for tasks assessed/performed             Past Medical History:  Diagnosis Date   Allergy    Anxiety    Aortic insufficiency    Mild to moderate by echo 11/2023   Bipolar 2 disorder (HCC)    Cervical polyp 12/16/2018   Depression    Former smoker 12/16/2018   Hyperlipidemia    Leukopenia    Mild aortic insufficiency    mild by echo 09/2022   Mild mitral regurgitation    Noted on echo 11/2023   MS (multiple sclerosis) (HCC)    Neuromuscular disorder (HCC)    MS    Normal coronary arteries cath 2015   Overactive bladder    PVC's (premature ventricular contractions) 04/09/2016   SVT (supraventricular tachycardia) (HCC) 04/22/2017   Vitamin D  deficiency    Past Surgical History:  Procedure Laterality Date   BREAST BIOPSY Left 2002   CARPAL TUNNEL RELEASE     CHOLECYSTECTOMY     COLONOSCOPY  2010   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     pigmented squamous cell carcinoma removal     POLYPECTOMY  2010   1 polyp- rectum , no path    UPPER GASTROINTESTINAL ENDOSCOPY  2010   Patient Active Problem List   Diagnosis Date Noted   Aortic insufficiency    Incomplete bladder emptying 11/11/2023   Mixed stress and urge urinary incontinence 11/11/2023   Other microscopic hematuria 11/11/2023   Medicare annual wellness visit, subsequent 10/28/2023   Nocturia 10/14/2023   Recurrent UTI 10/14/2023   Incontinence of feces 10/14/2023   Abnormal urinalysis 10/14/2023   High-tone pelvic floor  dysfunction 10/14/2023   Sacroiliac joint dysfunction of both sides 07/22/2023   Recurrent oral ulcers 03/22/2023   CFIDS (chronic fatigue and immune dysfunction syndrome) (HCC) 03/22/2023   History of skin cancer 10/22/2022   Mixed hyperlipidemia 10/16/2021   Slow transit constipation 10/16/2021   Elevated serum creatinine 12/13/2019   MS (multiple sclerosis) (HCC)    Cervical polyp 12/16/2018   History of steroid therapy 12/16/2018   Former smoker 12/16/2018   Statin intolerance 12/16/2018   Visual disturbance of one eye 08/12/2018   Circadian rhythm sleep disorder, unspecified type 06/02/2018   Vitamin D  deficiency 12/10/2017   SVT (supraventricular tachycardia) (HCC) 04/22/2017   Normal coronary arteries    Mild mitral regurgitation    Mild aortic insufficiency    Overactive bladder    Bipolar II disorder, moderate, depressed, with anxious distress (HCC)    Generalized anxiety disorder 04/09/2016   Fibrocystic disease of breast 04/09/2016   Migraine 04/09/2016   PVC's (premature ventricular contractions) 04/09/2016    PCP: Darlene Ehlers, MD   REFERRING PROVIDER: Darlene Ehlers, MD   REFERRING DIAG:  K59.01 (ICD-10-CM) - Slow transit constipation  G35 (ICD-10-CM) - MS (multiple sclerosis) (HCC)  N39.46 (ICD-10-CM) - Mixed stress and urge urinary incontinence  R35.1 (  ICD-10-CM) - Nocturia  R15.9 (ICD-10-CM) - Incontinence of feces, unspecified fecal incontinence type  M62.89 (ICD-10-CM) - High-tone pelvic floor dysfunction   62yo with neurogenic bladder with MS, mixed urinary incontinence, fecal incontinence, high tone pelvic floor, constipation, nocturia.   THERAPY DIAG:  Muscle weakness (generalized)  Unspecified lack of coordination  Rationale for Evaluation and Treatment: Rehabilitation  ONSET DATE: 2014  SUBJECTIVE:                                                                                                                                                                                            SUBJECTIVE STATEMENT: Pt reports that she has MS, started being incontinent at 63 yo In 2015 she had some pelvic PT, they put a thing in her, was not sure what it was, they also did acupuncture on her leg. It was in [place]. She still has the device. She is not sure what it did.  Dr Aron Lard stuck a finger in her and saw that her pelvic too tight. She sent her here.  Pt reports she hopes dr Aron Lard will give her her bladder back, equals control. New bladder medicine gives her control.      Fluid intake: water, coffee, does not drink sweet tea, pt not doing sugar anymore, d/t being a sugar addict  PAIN:  Are you having pain? Yes NPRS scale: 2-4/10 leg, 7-8/10 shoulder and hip Pain location: right leg- spasticity and neuropathy- right hip and shoulder- gets massages at the massage school once/ month  Aggravating factors: falls, water arerobics Relieving factors: massage  PRECAUTIONS: None  RED FLAGS: None   WEIGHT BEARING RESTRICTIONS: No  FALLS:  Has patient fallen in last 6 months? No- last year- yes  OCCUPATION: disability  ACTIVITY LEVEL : water aerobics. [employer], seated elliptical, walks with neighbor, trying to lose weight, hurts to walk  PLOF: Independent  PATIENT GOALS: control over bladder  PERTINENT HISTORY:   Sexual abuse: No  BOWEL MOVEMENT: was constipated, but taking miralax now cap and half every morning, goes 2-3 times/ day Has wet farts   URINATION: Pain with urination: No unless she has UTI Fully empty bladder: Yes: thinks she does, but dr makes her cath to see for sure- so she does not know yet Stream: in between Urgency: Yes  Frequency: better on new meds ( gemtesa ) Leakage: Walking to the bathroom- gets the urge to go, has 10 seconds to make it to the bathroom, hard to get cat and dog off lap Pads: Yes: 1-2  INTERCOURSE: not active   PREGNANCY: none PROLAPSE: None   OBJECTIVE:  Note: Objective measures were  completed at Evaluation unless otherwise noted.  DIAGNOSTIC FINDINGS:  None recently  PATIENT SURVEYS:    PFIQ-7: 143  COGNITION: Overall cognitive status: Within functional limits for tasks assessed     SENSATION: Light touch: Appears intact  LUMBAR SPECIAL TESTS:  Single leg stance test: Positive for dipping, more on lt  GAIT: Assistive device utilized: None Comments: antalgic  POSTURE: rounded shoulders and forward head   LUMBARAROM/PROM:   A/PROM A/PROM  Eval % availability  Flexion 60  Extension WFL  Right lateral flexion 80  Left lateral flexion 80  Right rotation 80  Left rotation 80   (Blank rows = not tested)  LOWER EXTREMITY ROM: bilateral hips full  LOWER EXTREMITY MMT: 4/5 grossly throughout  PALPATION:   General: DRA abdomen, low tone with doming, able to activate pelvic floor- palpated through clothing  Pelvic Alignment: even  Abdominal: doming , low tone                External Perineal Exam: deferred                             Internal Pelvic Floor: deferred  Patient confirms identification and approves PT to assess internal pelvic floor and treatment No  PELVIC MMT:   MMT eval  Vaginal   Internal Anal Sphincter   External Anal Sphincter   Puborectalis   Diastasis Recti 2 fingers at umbilicus, doming present above umbilicus  (Blank rows = not tested)        TONE: deferred  PROLAPSE: deferred  TODAY'S TREATMENT:                                                                                                                              DATE: 12/02/2023  EVAL see below   PATIENT EDUCATION/ there acts Education details: relevant core and pelvic floor anatomy, expectations of PT, exam findings Person educated: Patient Education method: Explanation, Demonstration, Tactile cues, Verbal cues, and Handouts Education comprehension: verbalized understanding, returned demonstration, verbal cues required, tactile cues required, and  needs further education  HOME EXERCISE PROGRAM: [id number]  ASSESSMENT:  CLINICAL IMPRESSION: Patient is a 63 y.o. F who was seen today for physical therapy evaluation and treatment for urinary urgency and incontinence and fecal incontinence. She was nervous today, did not know what to expect. Pt with poor tone abdominal musculature, decreased core coordination, decreased pelvic floor awareness, decreased hip strength, tightness throughout lumbar spine. She did not agree to internal pelvic floor muscle assessment, but seems agreeable to pelvic PT to reduce fecal incontinence and urinary urgency and incontinence and improve control. She will benefit from PT to address deficits  OBJECTIVE IMPAIRMENTS: Abnormal gait, decreased activity tolerance, decreased balance, decreased coordination, decreased knowledge of condition, decreased mobility, difficulty walking, decreased ROM, decreased strength, impaired tone, improper body mechanics, obesity, and pain.   ACTIVITY LIMITATIONS: lifting, continence, and toileting  PARTICIPATION LIMITATIONS: community  activity  PERSONAL FACTORS: Time since onset of injury/illness/exacerbation are also affecting patient's functional outcome.   REHAB POTENTIAL: Good  CLINICAL DECISION MAKING: Evolving/moderate complexity  EVALUATION COMPLEXITY: Moderate   GOALS: Goals reviewed with patient? Yes  SHORT TERM GOALS: Target date: 12/30/2023    Pt will be independent with the knack, urge suppression technique, and double voiding in order to improve bladder habits and decrease urinary incontinence.   Baseline: Goal status: INITIAL  2.  Pt will be independent with HEP.   Baseline:  Goal status: INITIAL  3.  Pt will be able to participate in 40 minute PT session without increased hip and shoulder pain Baseline: up to 8/10 Goal status: INITIAL  4.  Pt will be independent with use of squatty potty, relaxed toileting mechanics, and improved bowel movement  techniques in order to increase ease of bowel movements and complete evacuation.   Baseline:  Goal status: INITIAL  LONG TERM GOALS: Target date: 06/03/2024  Pt will be independent with advanced HEP.   Baseline:  Goal status: INITIAL  2.  Pt will have improved PFIQ-7 by at least 30 points Baseline: 143 Goal status: INITIAL  3.  Pt will soak 0 pads/ day Baseline: 2 Goal status: INITIAL  4.  Pt will be able to walk her dog without increased fecal or urinary urgency for at least 1 hr Baseline:  Goal status: INITIAL  5.  Pt will be able to participate in community activities as needed without leaking Baseline:  Goal status: INITIAL    PLAN:  PT FREQUENCY: 1-2x/week  PT DURATION: 6 months  PLANNED INTERVENTIONS: 97110-Therapeutic exercises, 97530- Therapeutic activity, 97112- Neuromuscular re-education, 97535- Self Care, 82956- Manual therapy, 97032- Electrical stimulation (manual), Taping, Dry Needling, Joint mobilization, Joint manipulation, Spinal manipulation, Spinal mobilization, Scar mobilization, Cryotherapy, Moist heat, and Biofeedback  PLAN FOR NEXT SESSION: TTNS, pelvic floor assessment, cont coordination an strengthening exercises   Clarita Mcelvain, PT 12/02/2023, 4:26 PM

## 2023-12-04 ENCOUNTER — Encounter: Payer: Self-pay | Admitting: Obstetrics and Gynecology

## 2023-12-04 ENCOUNTER — Ambulatory Visit: Admitting: Obstetrics and Gynecology

## 2023-12-04 VITALS — BP 93/68 | HR 63

## 2023-12-04 DIAGNOSIS — R339 Retention of urine, unspecified: Secondary | ICD-10-CM

## 2023-12-04 DIAGNOSIS — K5901 Slow transit constipation: Secondary | ICD-10-CM

## 2023-12-04 DIAGNOSIS — N3946 Mixed incontinence: Secondary | ICD-10-CM

## 2023-12-04 MED ORDER — MIRABEGRON ER 50 MG PO TB24
50.0000 mg | ORAL_TABLET | Freq: Every day | ORAL | 2 refills | Status: DC
Start: 2023-12-04 — End: 2024-03-12

## 2023-12-04 NOTE — Progress Notes (Signed)
 [facility] Return Visit  SUBJECTIVE  History of Present Illness: Sarah Hogan is a 63 y.o. female seen in follow-up for incomplete emptying and FI. Plan at last visit was CIC catheterizing and continue Gemtesa  75mg  daily. Patient had also been working to titrate up on her miralax and reports she is doing well.     Past Medical History: Patient  has a past medical history of Allergy, Anxiety, Aortic insufficiency, Bipolar 2 disorder (HCC), Cervical polyp (12/16/2018), Depression, Former smoker (12/16/2018), Hyperlipidemia, Leukopenia, Mild aortic insufficiency, Mild mitral regurgitation, MS (multiple sclerosis) (HCC), Neuromuscular disorder (HCC), Normal coronary arteries (cath 2015), Overactive bladder, PVC's (premature ventricular contractions) (04/09/2016), SVT (supraventricular tachycardia) (HCC) (04/22/2017), and Vitamin D  deficiency.   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   Medications: She has a current medication list which includes the following prescription(s): baclofen, biotin, cholecalciferol, chromium picolinate, clonazepam , estradiol , ezetimibe , ibuprofen , lurasidone , metoprolol  succinate, mirabegron  er, modafinil , NONFORMULARY OR COMPOUNDED ITEM, oxcarbazepine , pregabalin, trazodone , and venlafaxine  xr.   Allergies: Patient is allergic to amoxicillin and statins.   Social History: Patient  reports that she quit smoking about 29 years ago. Her smoking use included cigarettes. She started smoking about 49 years ago. She has a 30 pack-year smoking history. She has never used smokeless tobacco. She reports current alcohol use. She reports that she does not use drugs.     OBJECTIVE     Physical  Exam: Vitals:   12/04/23 1359  BP: 93/68  Pulse: 63   Gen: No apparent distress, A&O x 3.  Detailed Urogynecologic Evaluation:  External exam normal. No obvious vaginal bleeding or urethral concerns or   PVR by straight cath was   ASSESSMENT AND PLAN    Sarah Hogan is a 63 y.o. with:  1. Incomplete bladder emptying   2. Slow transit constipation   3. Mixed stress and urge urinary incontinence     Patient reports she feels the medication has been working well for her. She was only able to successfully cathetarize once but feels she is emptying well. PVR by cath today in office was 125. She has been doing PT and that is going well. Per the bladder diary, on the most recent day she drank and had output which is better than previous noted amounts where she had drank and had output of . This bladder diary will be scanned into the chart.  Patient has been having a bowel movement almost daily with her miralax titration and reportedly is happy with this. She denies increased leakage at this time.  Patient cannot afford Gemtesa  75mg  daily. She is requesting to go back to her Myrbetriq  which is more affordable. Will send a new prescription for the Myrbetriq  50mg  to the mail order pharmacy.   Patient to follow up with Dr. Aron Lard for Cystoscopy.    Silver Parkey G Davy Faught, NP

## 2023-12-08 ENCOUNTER — Encounter: Payer: Self-pay | Admitting: Physical Therapy

## 2023-12-08 ENCOUNTER — Ambulatory Visit: Admitting: Physical Therapy

## 2023-12-08 DIAGNOSIS — M6281 Muscle weakness (generalized): Secondary | ICD-10-CM

## 2023-12-08 DIAGNOSIS — R279 Unspecified lack of coordination: Secondary | ICD-10-CM

## 2023-12-08 NOTE — Therapy (Signed)
 OUTPATIENT PHYSICAL THERAPY FEMALE PELVIC TREATMENT   Patient Name: Sarah Hogan MRN: [id number] DOB:[birth year], 63 y.o., female Today's Date: 12/08/2023  END OF SESSION:  PT End of Session - 12/08/23 1520     Visit Number 2    Date for PT Re-Evaluation 06/03/24    Authorization Type [employer] [facility] [place]    PT Start Time 1520    PT Stop Time 1603    PT Time Calculation (min) 43 min    Activity Tolerance Patient tolerated treatment well    Behavior During Therapy [facility] for tasks assessed/performed              Past Medical History:  Diagnosis Date   Allergy    Anxiety    Aortic insufficiency    Mild to moderate by echo 11/2023   Bipolar 2 disorder (HCC)    Cervical polyp 12/16/2018   Depression    Former smoker 12/16/2018   Hyperlipidemia    Leukopenia    Mild aortic insufficiency    mild by echo 09/2022   Mild mitral regurgitation    Noted on echo 11/2023   MS (multiple sclerosis) (HCC)    Neuromuscular disorder (HCC)    MS    Normal coronary arteries cath 2015   Overactive bladder    PVC's (premature ventricular contractions) 04/09/2016   SVT (supraventricular tachycardia) (HCC) 04/22/2017   Vitamin D  deficiency    Past Surgical History:  Procedure Laterality Date   BREAST BIOPSY Left 2002   CARPAL TUNNEL RELEASE     CHOLECYSTECTOMY     COLONOSCOPY  2010   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     pigmented squamous cell carcinoma removal     POLYPECTOMY  2010   1 polyp- rectum , no path    UPPER GASTROINTESTINAL ENDOSCOPY  2010   Patient Active Problem List   Diagnosis Date Noted   Aortic insufficiency    Incomplete bladder emptying 11/11/2023   Mixed stress and urge urinary incontinence 11/11/2023   Other microscopic hematuria 11/11/2023   Medicare annual wellness visit, subsequent 10/28/2023   Nocturia 10/14/2023   Recurrent UTI 10/14/2023   Incontinence of feces 10/14/2023   Abnormal urinalysis 10/14/2023   High-tone pelvic floor  dysfunction 10/14/2023   Sacroiliac joint dysfunction of both sides 07/22/2023   Recurrent oral ulcers 03/22/2023   CFIDS (chronic fatigue and immune dysfunction syndrome) (HCC) 03/22/2023   History of skin cancer 10/22/2022   Mixed hyperlipidemia 10/16/2021   Slow transit constipation 10/16/2021   Elevated serum creatinine 12/13/2019   MS (multiple sclerosis) (HCC)    Cervical polyp 12/16/2018   History of steroid therapy 12/16/2018   Former smoker 12/16/2018   Statin intolerance 12/16/2018   Visual disturbance of one eye 08/12/2018   Circadian rhythm sleep disorder, unspecified type 06/02/2018   Vitamin D  deficiency 12/10/2017   SVT (supraventricular tachycardia) (HCC) 04/22/2017   Normal coronary arteries    Mild mitral regurgitation    Mild aortic insufficiency    Overactive bladder    Bipolar II disorder, moderate, depressed, with anxious distress (HCC)    Generalized anxiety disorder 04/09/2016   Fibrocystic disease of breast 04/09/2016   Migraine 04/09/2016   PVC's (premature ventricular contractions) 04/09/2016    PCP: Darlene Ehlers, MD   REFERRING PROVIDER: Darlene Ehlers, MD   REFERRING DIAG:  K59.01 (ICD-10-CM) - Slow transit constipation  G35 (ICD-10-CM) - MS (multiple sclerosis) (HCC)  N39.46 (ICD-10-CM) - Mixed stress and urge urinary incontinence  R35.1 (ICD-10-CM) - Nocturia  R15.9 (ICD-10-CM) - Incontinence of feces, unspecified fecal incontinence type  M62.89 (ICD-10-CM) - High-tone pelvic floor dysfunction   62yo with neurogenic bladder with MS, mixed urinary incontinence, fecal incontinence, high tone pelvic floor, constipation, nocturia.   THERAPY DIAG:  Muscle weakness (generalized)  Unspecified lack of coordination  Rationale for Evaluation and Treatment: Rehabilitation  ONSET DATE: 2014  SUBJECTIVE:                                                                                                                                                                                            SUBJECTIVE STATEMENT: Pt reports that she just went to water aerobics, she feels good She does not like to exercise, she would like to just sit and read She did some exercises in the sauna Forgot the device.  New medicine gave her control but she can't afford it- it is $300/ month Yesterday she lost control of of her bowels, it was one of those days, it was not what she ate. She is not sure   Pt reports that she is supposed to try to cath, use a tampon. Make sure she can empty completely    Fluid intake: water, coffee, does not drink sweet tea, pt not doing sugar anymore, d/t being a sugar addict  PAIN:  Are you having pain? Yes NPRS scale: 2-4/10 leg, 7-8/10 shoulder and hip Pain location: right leg- spasticity and neuropathy- right hip and shoulder- gets massages at the massage school once/ month  Aggravating factors: falls, water arerobics Relieving factors: massage  PRECAUTIONS: None  RED FLAGS: None   WEIGHT BEARING RESTRICTIONS: No  FALLS:  Has patient fallen in last 6 months? No- last year- yes  OCCUPATION: disability  ACTIVITY LEVEL : water aerobics. [employer], seated elliptical, walks with neighbor, trying to lose weight, hurts to walk  PLOF: Independent  PATIENT GOALS: control over bladder  PERTINENT HISTORY:   Sexual abuse: No  BOWEL MOVEMENT: was constipated, but taking miralax now cap and half every morning, goes 2-3 times/ day Has wet farts   URINATION: Pain with urination: No unless she has UTI Fully empty bladder: Yes: thinks she does, but dr makes her cath to see for sure- so she does not know yet Stream: in between Urgency: Yes  Frequency: better on new meds ( gemtesa ) Leakage: Walking to the bathroom- gets the urge to go, has 10 seconds to make it to the bathroom, hard to get cat and dog off lap Pads: Yes: 1-2  INTERCOURSE: not active   PREGNANCY: none PROLAPSE: None   OBJECTIVE:  Note: Objective  measures  were completed at Evaluation unless otherwise noted.  DIAGNOSTIC FINDINGS:  None recently  PATIENT SURVEYS:    PFIQ-7: 143  COGNITION: Overall cognitive status: Within functional limits for tasks assessed     SENSATION: Light touch: Appears intact  LUMBAR SPECIAL TESTS:  Single leg stance test: Positive for dipping, more on lt  GAIT: Assistive device utilized: None Comments: antalgic  POSTURE: rounded shoulders and forward head   LUMBARAROM/PROM:   A/PROM A/PROM  Eval % availability  Flexion 60  Extension WFL  Right lateral flexion 80  Left lateral flexion 80  Right rotation 80  Left rotation 80   (Blank rows = not tested)  LOWER EXTREMITY ROM: bilateral hips full  LOWER EXTREMITY MMT: 4/5 grossly throughout  PALPATION:   General: DRA abdomen, low tone with doming, able to activate pelvic floor- palpated through clothing  Pelvic Alignment: even  Abdominal: doming , low tone                External Perineal Exam: mild dryness present, good clitoral hood mobility, reduced labia minora                            Internal Pelvic Floor: mild dryness, holds high tone, reported decreased sensation - more on left Has high tone and difficulty with bulging and lengthening of pelvic floor  Patient confirms identification and approves PT to assess internal pelvic floor and treatment   PELVIC MMT:   MMT eval  Vaginal 3/5, difficulty with lengthening  Internal Anal Sphincter Difficulty with bulging, 3/5(weakness present)  External Anal Sphincter 3/5, difficulty with bulging/ lengthening  Puborectalis   Diastasis Recti 2 fingers at umbilicus, doming present above umbilicus  (Blank rows = not tested)        TONE: high  PROLAPSE: None noticed  TODAY'S TREATMENT:                                                                                                                              DATE: 12/08/2023    Neuro reed- pelvic floor  muscle contract/  relax/ bulge training vaginally and rectally with coordination with breathing Happy baby on a wall Child's pose     Therapeutic activities- strategies for self cathing    PATIENT EDUCATION/ there acts Education details: relevant core and pelvic floor anatomy, expectations of PT, exam findings Person educated: Patient Education method: Explanation, Demonstration, Tactile cues, Verbal cues, and Handouts Education comprehension: verbalized understanding, returned demonstration, verbal cues required, tactile cues required, and needs further education  HOME EXERCISE PROGRAM: [id number]  ASSESSMENT:  CLINICAL IMPRESSION: Patient is a 63 y.o. F who was seen today for physical therapy evaluation and treatment for urinary urgency and incontinence and fecal incontinence. Pt with poor tone abdominal musculature, decreased core coordination, decreased pelvic floor awareness, decreased hip strength, tightness throughout lumbar spine. She has high pelvic floor tone and decreased coordination and sensation throughout-  more on left.  Added stretches and diaphragmatic breathing for down training to her HEP.  She will benefit from PT to address deficits  OBJECTIVE IMPAIRMENTS: Abnormal gait, decreased activity tolerance, decreased balance, decreased coordination, decreased knowledge of condition, decreased mobility, difficulty walking, decreased ROM, decreased strength, impaired tone, improper body mechanics, obesity, and pain.   ACTIVITY LIMITATIONS: lifting, continence, and toileting  PARTICIPATION LIMITATIONS: community activity  PERSONAL FACTORS: Time since onset of injury/illness/exacerbation are also affecting patient's functional outcome.   REHAB POTENTIAL: Good  CLINICAL DECISION MAKING: Evolving/moderate complexity  EVALUATION COMPLEXITY: Moderate   GOALS: Goals reviewed with patient? Yes  SHORT TERM GOALS: Target date: 12/30/2023    Pt will be independent with the knack, urge  suppression technique, and double voiding in order to improve bladder habits and decrease urinary incontinence.   Baseline: Goal status: INITIAL  2.  Pt will be independent with HEP.   Baseline:  Goal status: INITIAL  3.  Pt will be able to participate in 40 minute PT session without increased hip and shoulder pain Baseline: up to 8/10 Goal status: INITIAL  4.  Pt will be independent with use of squatty potty, relaxed toileting mechanics, and improved bowel movement techniques in order to increase ease of bowel movements and complete evacuation.   Baseline:  Goal status: INITIAL  LONG TERM GOALS: Target date: 06/03/2024  Pt will be independent with advanced HEP.   Baseline:  Goal status: INITIAL  2.  Pt will have improved PFIQ-7 by at least 30 points Baseline: 143 Goal status: INITIAL  3.  Pt will soak 0 pads/ day in order to reduce financial  burden Baseline: 2 Goal status: INITIAL  4.  Pt will be able to walk her dog without increased fecal or urinary urgency for at least 1 hr Baseline:  Goal status: INITIAL  5.  Pt will be able to participate in community activities as needed without leaking Baseline:  Goal status: INITIAL    PLAN:  PT FREQUENCY: 1-2x/week  PT DURATION: 6 months  PLANNED INTERVENTIONS: 97110-Therapeutic exercises, 97530- Therapeutic activity, 97112- Neuromuscular re-education, 97535- Self Care, 14782- Manual therapy, 97032- Electrical stimulation (manual), Taping, Dry Needling, Joint mobilization, Joint manipulation, Spinal manipulation, Spinal mobilization, Scar mobilization, Cryotherapy, Moist heat, and Biofeedback  PLAN FOR NEXT SESSION: TTNS, pelvic floor assessment, cont coordination an strengthening exercises   Nelta Caudill, PT 12/08/2023, 4:17 PM

## 2023-12-09 ENCOUNTER — Encounter: Payer: Self-pay | Admitting: Physician Assistant

## 2023-12-09 ENCOUNTER — Ambulatory Visit: Payer: No Typology Code available for payment source | Admitting: Physician Assistant

## 2023-12-09 DIAGNOSIS — G4700 Insomnia, unspecified: Secondary | ICD-10-CM | POA: Diagnosis not present

## 2023-12-09 DIAGNOSIS — F411 Generalized anxiety disorder: Secondary | ICD-10-CM | POA: Diagnosis not present

## 2023-12-09 DIAGNOSIS — F3181 Bipolar II disorder: Secondary | ICD-10-CM

## 2023-12-09 MED ORDER — TRAZODONE HCL 100 MG PO TABS
100.0000 mg | ORAL_TABLET | Freq: Every evening | ORAL | 1 refills | Status: AC | PRN
Start: 2023-12-09 — End: ?

## 2023-12-09 NOTE — Progress Notes (Unsigned)
 [facility]  Patient ID: Sarah Hogan,  MRN: [id number]  PCP: Annella Kief, NP  Date of Evaluation: 12/09/2023 Time spent:25 minutes  Chief Complaint:   HISTORY/CURRENT STATUS: HPI   Feels flat, unable to have emotions, feels like she's watching the world go by. She hates that feeling.     Individual Medical History/ Review of Systems: Changes? :No  Has long-term MS, since 1990.   Past medications for mental health diagnoses include: Geodon -Has been helpful for her depression. Worsening TD on 20 mg.  Latuda - Effective. Took samples.  Seroquel- Took briefly. Had severe fatigue Effexor - Taken for several years Zoloft- Was effective and then stopped working Prozac Remeron- Lost 80 lbs when she stopped taking it. Buspar Trileptal - Helps stabilize mood. Has taken long-term. Lamictal -Worsening depression Tranxene  Provigil  Trazodone   Allergies: Amoxicillin and Statins  Current Medications:  Current Outpatient Medications:    baclofen (LIORESAL) 10 MG tablet, Take 10 mg by mouth 2 (two) times daily., Disp: , Rfl:    Biotin 5000 MCG CAPS, Take by mouth., Disp: , Rfl:    cholecalciferol (VITAMIN D3) 25 MCG (1000 UNIT) tablet, Take 1,000 Units by mouth daily., Disp: , Rfl:    Chromium Picolinate (CHROMIUM PICOLATE PO), Take by mouth., Disp: , Rfl:    clonazePAM  (KLONOPIN ) 0.5 MG tablet, Take 1 tablet (0.5 mg total) by mouth daily as needed for anxiety., Disp: 30 tablet, Rfl: 0   estradiol  (ESTRACE ) 0.1 MG/GM vaginal cream, Place 1g nightly for two weeks then twice a week after, Disp: 30 g, Rfl: 3   ezetimibe  (ZETIA ) 10 MG tablet, Take 1 tablet (10 mg total) by mouth daily., Disp: 90 tablet, Rfl: 3   ibuprofen  (ADVIL ) 800 MG tablet, Take 1 tablet (800 mg total) by mouth every 8 (eight) hours as needed., Disp: 90 tablet, Rfl: 1   metoprolol  succinate (TOPROL  XL) 50 MG 24 hr tablet, Take 1 tablet (50 mg total) by mouth daily. Take with or immediately following a meal.,  Disp: 90 tablet, Rfl: 3   modafinil  (PROVIGIL ) 100 MG tablet, Take 100 mg by mouth daily as needed (Driving)., Disp: , Rfl:    NONFORMULARY OR COMPOUNDED ITEM, Compounded progesterone  SR 100mg .  1 capsule daily., Disp: 90 each, Rfl: 3   OXcarbazepine  (TRILEPTAL ) 150 MG tablet, Take 1 tablet (150 mg total) by mouth 2 (two) times daily., Disp: 180 tablet, Rfl: 1   pregabalin (LYRICA) 50 MG capsule, Take 50 mg by mouth 2 (two) times daily., Disp: , Rfl:    venlafaxine  XR (EFFEXOR -XR) 75 MG 24 hr capsule, TAKE ONE CAPSULE BY MOUTH DAILY WITH BREAKFAST, Disp: 90 capsule, Rfl: 1   mirabegron  ER (MYRBETRIQ ) 50 MG TB24 tablet, Take 1 tablet (50 mg total) by mouth daily. (Patient not taking: Reported on 12/09/2023), Disp: 90 tablet, Rfl: 2   traZODone  (DESYREL ) 100 MG tablet, Take 1 tablet (100 mg total) by mouth at bedtime as needed for sleep., Disp: 90 tablet, Rfl: 1 Medication Side Effects: none  Family Medical/ Social History: Changes? 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  General Appearance: Casual, Well Groomed, and Obese  Eye Contact:  Good  Speech:  Clear and Coherent and Normal Rate  Volume:  Normal  Mood:  Euthymic  Affect:  Congruent  Thought Process:  Goal Directed and Descriptions of Associations: Circumstantial  Orientation:  Full (Time, Place, and Person)  Thought Content:  Logical   Suicidal Thoughts:  No  Homicidal Thoughts:  No  Memory:  fair  Judgement:  Good  Insight:  Good  Psychomotor Activity:  Normal  Concentration:  Concentration: Good  Recall:  Fair  Fund of Knowledge: Good  Language: Good  Assets:  Communication Skills Desire for Improvement Financial Resources/Insurance Social Support Transportation  ADL's:  Intact  Cognition: WNL  Prognosis:  Good   Her PCP follows her labs.  DIAGNOSES:  No diagnosis found.  Receiving Psychotherapy: No    RECOMMENDATIONS:  PDMP reviewed.  Lyrica filled 10/22/2023.  Klonopin  filled 02/05/2023.    1/2 latuda  for 1 week, then stop   Continue Klonopin  0.5 mg, 1 p.o. daily as needed anxiety.  Continue modafinil  100 mg, 1 p.o. daily as needed. Continue Trileptal  150 mg, 1 p.o. twice daily. Continue trazodone  100 mg, 1 p.o. nightly as needed. Continue Effexor  XR 75 mg, 1 p.o. daily. Return in 6-8 weeks.   Marvia Slocumb, PA-C

## 2023-12-11 ENCOUNTER — Encounter: Payer: Self-pay | Admitting: Obstetrics

## 2023-12-11 ENCOUNTER — Other Ambulatory Visit (HOSPITAL_COMMUNITY)
Admission: RE | Admit: 2023-12-11 | Discharge: 2023-12-11 | Disposition: A | Discharge status: Home / Self Care | Source: Other Acute Inpatient Hospital | Attending: Obstetrics | Admitting: Obstetrics

## 2023-12-11 ENCOUNTER — Ambulatory Visit: Payer: Self-pay | Admitting: Obstetrics

## 2023-12-11 ENCOUNTER — Ambulatory Visit (INDEPENDENT_AMBULATORY_CARE_PROVIDER_SITE_OTHER): Admitting: Obstetrics

## 2023-12-11 VITALS — BP 108/65 | HR 61

## 2023-12-11 DIAGNOSIS — N390 Urinary tract infection, site not specified: Secondary | ICD-10-CM | POA: Diagnosis not present

## 2023-12-11 DIAGNOSIS — Z87440 Personal history of urinary (tract) infections: Secondary | ICD-10-CM

## 2023-12-11 DIAGNOSIS — R319 Hematuria, unspecified: Secondary | ICD-10-CM | POA: Insufficient documentation

## 2023-12-11 DIAGNOSIS — N3946 Mixed incontinence: Secondary | ICD-10-CM

## 2023-12-11 LAB — POCT URINALYSIS DIPSTICK
Bilirubin, UA: NEGATIVE
Bilirubin, UA: NEGATIVE
Glucose, UA: NEGATIVE
Glucose, UA: NEGATIVE
Ketones, UA: NEGATIVE
Ketones, UA: NEGATIVE
Nitrite, UA: POSITIVE
Nitrite, UA: POSITIVE
Protein, UA: NEGATIVE
Protein, UA: NEGATIVE
Spec Grav, UA: 1.015 (ref 1.010–1.025)
Spec Grav, UA: 1.02 (ref 1.010–1.025)
Urobilinogen, UA: 0.2 U/dL
Urobilinogen, UA: 0.2 U/dL
pH, UA: 7.5 (ref 5.0–8.0)
pH, UA: 7 (ref 5.0–8.0)

## 2023-12-11 MED ORDER — CIPROFLOXACIN HCL 500 MG PO TABS: 500.0000 mg | ORAL_TABLET | Freq: Two times a day (BID) | ORAL | 0 refills | Status: AC

## 2023-12-11 MED ORDER — MIRABEGRON ER 50 MG PO TB24: 50.0000 mg | ORAL_TABLET | Freq: Every day | ORAL | 3 refills | Status: DC

## 2023-12-11 MED ORDER — CIPROFLOXACIN HCL 500 MG PO TABS: 500.0000 mg | ORAL_TABLET | Freq: Once | ORAL | Status: DC

## 2023-12-11 NOTE — Addendum Note (Signed)
 Addended by: Graciela Lava on: 12/11/2023 02:39 PM   Modules accepted: Orders

## 2023-12-11 NOTE — Progress Notes (Addendum)
 CYSTOSCOPY  CC:  This is a 63 y.o. with microscopic hematuria and recurrent UTI who presents today for cystoscopy.  Reports cramping with urination.  Denies blood in urine, dysuria, one sided back pain, increased frequency/urgency, leakage.  Using miralax daily, stopped 2 days ago pending to purchase today.  Completing Gemtesa  for 3 more day.    Lab Results  Component Value Date   COLORU yellow 11/11/2023   CLARITYU cloudy 11/11/2023   GLUCOSEUR Negative 11/11/2023   BILIRUBINUR NEGATIVE 11/11/2023   KETONESU Negative 11/11/2023   SPECGRAV 1.015 11/11/2023   RBCUR moderate 11/11/2023   PHUR 7.0 11/11/2023   PROTEINUR NEGATIVE 11/11/2023   UROBILINOGEN 0.2 11/11/2023   LEUKOCYTESUR LARGE (A) 11/11/2023     LMP 06/28/2008   CYSTOSCOPY: A time out was performed.  The periurethral area was prepped and draped in a sterile manner.  2% lidocaine  jetpack was inserted at the urethral meatus and the urethra and bladder visualized with a flexible cystoscope.  She had normal urethral coaptation and normal urethral mucosa.  She had abnormal  irritated bladder mucosa without focal lesions, foreign material, or stones. White sedimentation noted during cystoscopy. She had bilateral clear efflux from both ureteral orifices.  She had squamous metaplasia at the trigone, no trabeculations, no cellules or diverticuli.    Straight Catheterization Procedure for PVR: After verbal consent was obtained from the patient for catheterization to assess bladder emptying and residual volume the urethra and surrounding tissues were prepped with betadine and an in and out catheterization was performed.  PVR was .  Urine appeared cloudy yellow. The patient tolerated the procedure well.   ASSESSMENT:  63 y.o. with microscopic hematuria and recurrent UTI. Cystoscopy today is abnormal  with inflammation and white sedimentation without focal lesion.  PLAN:  Follow-up to discuss findings and treatment options.   All questions answered and post-procedures instructions were given. - Rx cipro  500mg  BID if persistent symptoms of LUTS, provided Cipro  for pre-procedure prophylaxis due to history of rUTI - pending urine culture - encouraged to increased consistency of vaginal estrogen use - increased Rx mirabegron  to 50mg , pt to continue to monitor BP - Renal US  10/22/23 negative with 1.5cm simple cyst  Darlene Ehlers, MD

## 2023-12-11 NOTE — Patient Instructions (Addendum)
 Taking Care of Yourself after Urodynamics, Cystoscopy, Bulkamid Injection, or Botox Injection   Drink plenty of water for a day or two following your procedure. Try to have about 8 ounces (one cup) at a time, and do this 6 times or more per day unless you have fluid restrictitons AVOID irritative beverages such as coffee, tea, soda, alcoholic or citrus drinks for a day or two, as this may cause burning with urination.  For the first 1-2 days after the procedure, your urine may be pink or red in color. You may have some blood in your urine as a normal side effect of the procedure. Large amounts of bleeding or difficulty urinating are NOT normal. Call the nurse line if this happens or go to the nearest Emergency Room if the bleeding is heavy or you cannot urinate at all and it is after hours. If you had a Bulkamid injection in the urethra and need to be catheterized, ask for a pediatric catheter to be used (size 10 or 12-French) so the material is not pushed out of place.   You may experience some discomfort or a burning sensation with urination after having this procedure. You can use over the counter Azo or pyridium  to help with burning and follow the instructions on the packaging. If it does not improve within 1-2 days, or other symptoms appear (fever, chills, or difficulty urinating) call the office to speak to a nurse.  You may return to normal daily activities such as work, school, driving, exercising and housework on the day of the procedure. If your doctor gave you a prescription, take it as ordered.   Continue vaginal estrogen 1g twice a week.   Start ciprofloxacin  1 tab 500mg  by mouth twice a day for 5 days.    For Beta-3 agonist medication,there is a potential side effect of elevated blood pressure which is more likely to occur in individuals with uncontrolled hypertension. It appears that your most recent blood pressure is within normal limits 100s/60s. Please monitor your blood pressure and  stop the medication if you experience any headache, chest discomfort, or shortness of breath and seek care immediately.  I have printed your prescription of mirabegron  increase to 50mg  after 1 month and continue to monitor your blood pressure.  Continue pelvic floor PT

## 2023-12-11 NOTE — Addendum Note (Signed)
 Addended by: Uriah Trueba T on: 12/11/2023 12:42 PM   Modules accepted: Orders

## 2023-12-14 LAB — URINE CULTURE: Culture: 30000 — AB

## 2023-12-16 DIAGNOSIS — G35 Multiple sclerosis: Secondary | ICD-10-CM | POA: Diagnosis not present

## 2023-12-16 MED ORDER — TRIMETHOPRIM 100 MG PO TABS: 100.0000 mg | ORAL_TABLET | Freq: Every day | ORAL | 1 refills | Status: DC

## 2023-12-23 ENCOUNTER — Ambulatory Visit: Admitting: Physical Therapy

## 2023-12-29 ENCOUNTER — Encounter: Payer: Self-pay | Admitting: Physical Therapy

## 2023-12-29 ENCOUNTER — Ambulatory Visit: Attending: Obstetrics | Admitting: Physical Therapy

## 2023-12-29 DIAGNOSIS — M6281 Muscle weakness (generalized): Secondary | ICD-10-CM | POA: Insufficient documentation

## 2023-12-29 DIAGNOSIS — R279 Unspecified lack of coordination: Secondary | ICD-10-CM | POA: Insufficient documentation

## 2023-12-29 NOTE — Therapy (Signed)
 OUTPATIENT PHYSICAL THERAPY FEMALE PELVIC TREATMENT   Patient Name: Sarah Hogan MRN: [id number] DOB:Sep 26, 1960, 63 y.o., female Today's Date: 12/29/2023  END OF SESSION:  PT End of Session - 12/29/23 1311     Visit Number 3    Date for PT Re-Evaluation 06/03/24    Authorization Type [employer]    PT Start Time 1230    PT Stop Time 1315    PT Time Calculation (min) 45 min    Activity Tolerance Patient tolerated treatment well    Behavior During Therapy [facility] for tasks assessed/performed               Past Medical History:  Diagnosis Date   Allergy    Anxiety    Aortic insufficiency    Mild to moderate by echo 11/2023   Bipolar 2 disorder (HCC)    Cervical polyp 12/16/2018   Depression    Former smoker 12/16/2018   Hyperlipidemia    Leukopenia    Mild aortic insufficiency    mild by echo 09/2022   Mild mitral regurgitation    Noted on echo 11/2023   MS (multiple sclerosis) (HCC)    Neuromuscular disorder (HCC)    MS    Normal coronary arteries cath 2015   Overactive bladder    PVC's (premature ventricular contractions) 04/09/2016   SVT (supraventricular tachycardia) (HCC) 04/22/2017   Vitamin D  deficiency    Past Surgical History:  Procedure Laterality Date   BREAST BIOPSY Left 2002   CARPAL TUNNEL RELEASE     CHOLECYSTECTOMY     COLONOSCOPY  2010   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     pigmented squamous cell carcinoma removal     POLYPECTOMY  2010   1 polyp- rectum , no path    UPPER GASTROINTESTINAL ENDOSCOPY  2010   Patient Active Problem List   Diagnosis Date Noted   Hematuria 12/11/2023   Aortic insufficiency    Incomplete bladder emptying 11/11/2023   Mixed stress and urge urinary incontinence 11/11/2023   Other microscopic hematuria 11/11/2023   Medicare annual wellness visit, subsequent 10/28/2023   Nocturia 10/14/2023   Recurrent UTI 10/14/2023   Incontinence of feces 10/14/2023   Abnormal urinalysis 10/14/2023    High-tone pelvic floor dysfunction 10/14/2023   Sacroiliac joint dysfunction of both sides 07/22/2023   Recurrent oral ulcers 03/22/2023   CFIDS (chronic fatigue and immune dysfunction syndrome) (HCC) 03/22/2023   History of skin cancer 10/22/2022   Mixed hyperlipidemia 10/16/2021   Slow transit constipation 10/16/2021   Elevated serum creatinine 12/13/2019   MS (multiple sclerosis) (HCC)    Cervical polyp 12/16/2018   History of steroid therapy 12/16/2018   Former smoker 12/16/2018   Statin intolerance 12/16/2018   Visual disturbance of one eye 08/12/2018   Circadian rhythm sleep disorder, unspecified type 06/02/2018   Vitamin D  deficiency 12/10/2017   SVT (supraventricular tachycardia) (HCC) 04/22/2017   Normal coronary arteries    Mild mitral regurgitation    Mild aortic insufficiency    Overactive bladder    Bipolar II disorder, moderate, depressed, with anxious distress (HCC)    Generalized anxiety disorder 04/09/2016   Fibrocystic disease of breast 04/09/2016   Migraine 04/09/2016   PVC's (premature ventricular contractions) 04/09/2016    PCP: Darlene Ehlers, MD   REFERRING PROVIDER: Darlene Ehlers, MD   REFERRING DIAG:  K59.01 (ICD-10-CM) - Slow transit constipation  G35 (ICD-10-CM) - MS (multiple sclerosis) (HCC)  N39.46 (ICD-10-CM) - Mixed stress  and urge urinary incontinence  R35.1 (ICD-10-CM) - Nocturia  R15.9 (ICD-10-CM) - Incontinence of feces, unspecified fecal incontinence type  M62.89 (ICD-10-CM) - High-tone pelvic floor dysfunction   62yo with neurogenic bladder with MS, mixed urinary incontinence, fecal incontinence, high tone pelvic floor, constipation, nocturia.   THERAPY DIAG:  Muscle weakness (generalized)  Unspecified lack of coordination  Rationale for Evaluation and Treatment: Rehabilitation  ONSET DATE: 2014  SUBJECTIVE:                                                                                                                                                                                            SUBJECTIVE STATEMENT: Pt reports that it seems that she has more control over her bladder. She has increased her Myrbetric to 50mg  from 25mg  Sometimes she pees just in case before she walks her dog Saw her dr. - loves her. Did not like her last urogyne. Last urogyne wanted her to have a surgery.  Does not know if she is doing her exercises right Does her water aerobics, just came from it, feels good.  Does not think she is retaining urine.   Myrbetric helps her not go to bathroom so often, helps  Forgot her device- pelvic floor stimulation- did not work anyway when she tried it in 2005.     Fluid intake: water, coffee, does not drink sweet tea, pt not doing sugar anymore, d/t being a sugar addict  PAIN:  Are you having pain? Yes NPRS scale: 2-4/10 leg, 7-8/10 shoulder and hip Pain location: right leg- spasticity and neuropathy- right hip and shoulder- gets massages at the massage school once/ month  Aggravating factors: falls, water arerobics Relieving factors: massage  PRECAUTIONS: None  RED FLAGS: None   WEIGHT BEARING RESTRICTIONS: No  FALLS:  Has patient fallen in last 6 months? No- last year- yes  OCCUPATION: disability  ACTIVITY LEVEL : water aerobics. [employer], seated elliptical, walks with neighbor, trying to lose weight, hurts to walk  PLOF: Independent  PATIENT GOALS: control over bladder  PERTINENT HISTORY:   Sexual abuse: No  BOWEL MOVEMENT: was constipated, but taking miralax now cap and half every morning, goes 2-3 times/ day Has wet farts   URINATION: Pain with urination: No unless she has UTI Fully empty bladder: Yes: thinks she does, but dr makes her cath to see for sure- so she does not know yet Stream: in between Urgency: Yes  Frequency: better on new meds ( gemtesa ) Leakage: Walking to the bathroom- gets the urge to go, has 10 seconds to make it to the bathroom, hard to get cat and dog  off  lap Pads: Yes: 1-2  INTERCOURSE: not active   PREGNANCY: none PROLAPSE: None   OBJECTIVE:  Note: Objective measures were completed at Evaluation unless otherwise noted.  DIAGNOSTIC FINDINGS:  None recently  PATIENT SURVEYS:    PFIQ-7: 143  COGNITION: Overall cognitive status: Within functional limits for tasks assessed     SENSATION: Light touch: Appears intact  LUMBAR SPECIAL TESTS:  Single leg stance test: Positive for dipping, more on lt  GAIT: Assistive device utilized: None Comments: antalgic  POSTURE: rounded shoulders and forward head   LUMBARAROM/PROM:   A/PROM A/PROM  Eval % availability  Flexion 60  Extension WFL  Right lateral flexion 80  Left lateral flexion 80  Right rotation 80  Left rotation 80   (Blank rows = not tested)  LOWER EXTREMITY ROM: bilateral hips full  LOWER EXTREMITY MMT: 4/5 grossly throughout  PALPATION:   General: DRA abdomen, low tone with doming, able to activate pelvic floor  Pelvic Alignment: even  Abdominal: doming , low tone                External Perineal Exam: mild dryness present, good clitoral hood mobility, reduced labia minora                            Internal Pelvic Floor: mild dryness, holds high tone, reported decreased sensation - more on left Has high tone and difficulty with bulging and lengthening of pelvic floor  Patient confirms identification and approves PT to assess internal pelvic floor and treatment   PELVIC MMT:   MMT eval  Vaginal 3/5, difficulty with lengthening  Internal Anal Sphincter Difficulty with bulging, 3/5(weakness present)  External Anal Sphincter 3/5, difficulty with bulging/ lengthening  Puborectalis   Diastasis Recti 2 fingers at umbilicus, doming present above umbilicus  (Blank rows = not tested)        TONE: high  PROLAPSE: None noticed  TODAY'S TREATMENT:                                                                                                                               DATE: 12/29/2023 Neuro reed- hip adduction with ball with transverse abdominis breath in hooklying with black ball 15 reps                     Cat/ cow                      Child's pose modified with yoga mat and pillow under glutes and head for improved comfort  There ex- trial of TTNS- 20 mins - education on placement, protocol, where to purchase- [facility] vs [employer] Pt with near fall when taking her shoes off today Manual therapy- gentle hamstring and hip stretches- internal and external rotation in hooklying with diaphragmatic breathing  12/08/2023    Neuro reed- pelvic floor  muscle contract/ relax/ bulge training vaginally and rectally with coordination with breathing Happy baby on a wall Child's pose     Therapeutic activities- strategies for self cathing    PATIENT EDUCATION/ there acts Education details: relevant core and pelvic floor anatomy, expectations of PT, exam findings Person educated: Patient Education method: Explanation, Demonstration, Tactile cues, Verbal cues, and Handouts Education comprehension: verbalized understanding, returned demonstration, verbal cues required, tactile cues required, and needs further education  HOME EXERCISE PROGRAM: [id number]  ASSESSMENT:  CLINICAL IMPRESSION: Patient is a 63 y.o. F who was seen today for physical therapy evaluation and treatment for urinary urgency and incontinence and fecal incontinence. Pt with poor tone abdominal musculature, decreased core coordination, decreased pelvic floor awareness, decreased hip strength, tightness throughout lumbar spine. She has high pelvic floor tone and decreased coordination and sensation throughout- more on left.   Trial of transcutaneous tibial nerve stimulation today, continued down training, pt with less urinary urgency, still sometimes with just in case peeing, but she is seeing progress. Needed VC's and TC's for yoga stretches today  as well as manual therapy for spacticity with bilateral lower extremities.   OBJECTIVE IMPAIRMENTS: Abnormal gait, decreased activity tolerance, decreased balance, decreased coordination, decreased knowledge of condition, decreased mobility, difficulty walking, decreased ROM, decreased strength, impaired tone, improper body mechanics, obesity, and pain.   ACTIVITY LIMITATIONS: lifting, continence, and toileting  PARTICIPATION LIMITATIONS: community activity  PERSONAL FACTORS: Time since onset of injury/illness/exacerbation are also affecting patient's functional outcome.   REHAB POTENTIAL: Good  CLINICAL DECISION MAKING: Evolving/moderate complexity  EVALUATION COMPLEXITY: Moderate   GOALS: Goals reviewed with patient? Yes  SHORT TERM GOALS: Target date: 12/30/2023    Pt will be independent with the knack, urge suppression technique, and double voiding in order to improve bladder habits and decrease urinary incontinence.   Baseline: Goal status: progressing  2.  Pt will be independent with HEP.   Baseline:  Goal status: progressing  3.  Pt will be able to participate in 40 minute PT session without increased hip and shoulder pain Baseline: up to 8/10 Goal status: met  4.  Pt will be independent with use of squatty potty, relaxed toileting mechanics, and improved bowel movement techniques in order to increase ease of bowel movements and complete evacuation.   Baseline:  Goal status: INITIAL  LONG TERM GOALS: Target date: 06/03/2024  Pt will be independent with advanced HEP.   Baseline:  Goal status: INITIAL  2.  Pt will have improved PFIQ-7 by at least 30 points Baseline: 143 Goal status: INITIAL  3.  Pt will soak 0 pads/ day in order to reduce financial  burden Baseline: 2 Goal status: INITIAL  4.  Pt will be able to walk her dog without increased fecal or urinary urgency for at least 1 hr Baseline:  Goal status: INITIAL  5.  Pt will be able to participate in  community activities as needed without leaking Baseline:  Goal status: INITIAL    PLAN:  PT FREQUENCY: 1-2x/week  PT DURATION: 6 months  PLANNED INTERVENTIONS: 97110-Therapeutic exercises, 97530- Therapeutic activity, 97112- Neuromuscular re-education, 97535- Self Care, [id number]- Manual therapy, 97032- Electrical stimulation (manual), Taping, Dry Needling, Joint mobilization, Joint manipulation, Spinal manipulation, Spinal mobilization, Scar mobilization, Cryotherapy, Moist heat, and Biofeedback  PLAN FOR NEXT SESSION: TTNS, cont coordination an strengthening exercises, add HS stretches with strap/ thick theraband    Jye Fariss, PT 12/29/2023, 1:13 PM

## 2024-01-01 ENCOUNTER — Other Ambulatory Visit: Admitting: Obstetrics

## 2024-01-06 ENCOUNTER — Encounter: Payer: Self-pay | Admitting: Physical Therapy

## 2024-01-06 ENCOUNTER — Ambulatory Visit: Payer: Self-pay | Admitting: Physical Therapy

## 2024-01-06 DIAGNOSIS — R279 Unspecified lack of coordination: Secondary | ICD-10-CM

## 2024-01-06 DIAGNOSIS — M6281 Muscle weakness (generalized): Secondary | ICD-10-CM | POA: Diagnosis not present

## 2024-01-06 NOTE — Therapy (Signed)
 OUTPATIENT PHYSICAL THERAPY FEMALE PELVIC TREATMENT   Patient Name: Sarah Hogan MRN: [id number] DOB:September 10, 1960, 63 y.o., female Today's Date: 01/06/2024  END OF SESSION:  PT End of Session - 01/06/24 1616     Visit Number 4    Date for PT Re-Evaluation 06/03/24    Authorization Type [employer] - [place]    PT Start Time [phone]    PT Stop Time 1700    PT Time Calculation (min) 47 min    Activity Tolerance Patient tolerated treatment well    Behavior During Therapy [facility] for tasks assessed/performed             Past Medical History:  Diagnosis Date   Allergy    Anxiety    Aortic insufficiency    Mild to moderate by echo 11/2023   Bipolar 2 disorder (HCC)    Cervical polyp 12/16/2018   Depression    Former smoker 12/16/2018   Hyperlipidemia    Leukopenia    Mild aortic insufficiency    mild by echo 09/2022   Mild mitral regurgitation    Noted on echo 11/2023   MS (multiple sclerosis) (HCC)    Neuromuscular disorder (HCC)    MS    Normal coronary arteries cath 2015   Overactive bladder    PVC's (premature ventricular contractions) 04/09/2016   SVT (supraventricular tachycardia) (HCC) 04/22/2017   Vitamin D  deficiency    Past Surgical History:  Procedure Laterality Date   BREAST BIOPSY Left 2002   CARPAL TUNNEL RELEASE     CHOLECYSTECTOMY     COLONOSCOPY  2010   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     pigmented squamous cell carcinoma removal     POLYPECTOMY  2010   1 polyp- rectum , no path    UPPER GASTROINTESTINAL ENDOSCOPY  2010   Patient Active Problem List   Diagnosis Date Noted   Hematuria 12/11/2023   Aortic insufficiency    Incomplete bladder emptying 11/11/2023   Mixed stress and urge urinary incontinence 11/11/2023   Other microscopic hematuria 11/11/2023   Medicare annual wellness visit, subsequent 10/28/2023   Nocturia 10/14/2023   Recurrent UTI 10/14/2023   Incontinence of feces 10/14/2023   Abnormal urinalysis 10/14/2023   High-tone  pelvic floor dysfunction 10/14/2023   Sacroiliac joint dysfunction of both sides 07/22/2023   Recurrent oral ulcers 03/22/2023   CFIDS (chronic fatigue and immune dysfunction syndrome) (HCC) 03/22/2023   History of skin cancer 10/22/2022   Mixed hyperlipidemia 10/16/2021   Slow transit constipation 10/16/2021   Elevated serum creatinine 12/13/2019   MS (multiple sclerosis) (HCC)    Cervical polyp 12/16/2018   History of steroid therapy 12/16/2018   Former smoker 12/16/2018   Statin intolerance 12/16/2018   Visual disturbance of one eye 08/12/2018   Circadian rhythm sleep disorder, unspecified type 06/02/2018   Vitamin D  deficiency 12/10/2017   SVT (supraventricular tachycardia) (HCC) 04/22/2017   Normal coronary arteries    Mild mitral regurgitation    Mild aortic insufficiency    Overactive bladder    Bipolar II disorder, moderate, depressed, with anxious distress (HCC)    Generalized anxiety disorder 04/09/2016   Fibrocystic disease of breast 04/09/2016   Migraine 04/09/2016   PVC's (premature ventricular contractions) 04/09/2016    PCP: Darlene Ehlers, MD   REFERRING PROVIDER: Darlene Ehlers, MD   REFERRING DIAG:  K59.01 (ICD-10-CM) - Slow transit constipation  G35 (ICD-10-CM) - MS (multiple sclerosis) (HCC)  N39.46 (ICD-10-CM) - Mixed stress and urge  urinary incontinence  R35.1 (ICD-10-CM) - Nocturia  R15.9 (ICD-10-CM) - Incontinence of feces, unspecified fecal incontinence type  M62.89 (ICD-10-CM) - High-tone pelvic floor dysfunction   63yo with neurogenic bladder with MS, mixed urinary incontinence, fecal incontinence, high tone pelvic floor, constipation, nocturia.   THERAPY DIAG:  Muscle weakness (generalized)  Unspecified lack of coordination  Rationale for Evaluation and Treatment: Rehabilitation  ONSET DATE: 2014  SUBJECTIVE:                                                                                                                                                                                            SUBJECTIVE STATEMENT: Pt reports that everything feels better, she is not constipated, has been taking Milarax, has leaked 1 time in 2 weeks. Is not sure why she is feeling better. Taking Myrbetric, is working for her. It is life altering prescription.  She reports that she has had chronic illness for decades and listens to her body Trying to wait a little longer between going to pee.  Pt reports that she likes oatmeal bur does not like fruits and vegetables, vegetables are expensive.  Does not like that Miralax  is a laxative, if she knew it, she would not have started taking it.      Fluid intake: water, coffee, does not drink sweet tea, pt not doing sugar anymore, d/t being a sugar addict  PAIN:  Are you having pain? Yes NPRS scale: 2-4/10 leg, 7-8/10 shoulder and hip Pain location: right leg- spasticity and neuropathy- right hip and shoulder- gets massages at the massage school once/ month  Aggravating factors: falls, water arerobics Relieving factors: massage  PRECAUTIONS: None  RED FLAGS: None   WEIGHT BEARING RESTRICTIONS: No  FALLS:  Has patient fallen in last 6 months? No- last year- yes  OCCUPATION: disability  ACTIVITY LEVEL : water aerobics. [employer], seated elliptical, walks with neighbor, trying to lose weight, hurts to walk  PLOF: Independent  PATIENT GOALS: control over bladder  PERTINENT HISTORY:   Sexual abuse: No  BOWEL MOVEMENT: was constipated, but taking miralax now cap and half every morning, goes 2-3 times/ day Has wet farts   URINATION: Pain with urination: No unless she has UTI Fully empty bladder: Yes: thinks she does, but dr makes her cath to see for sure- so she does not know yet Stream: in between Urgency: Yes  Frequency: better on new meds ( gemtesa ) Leakage: Walking to the bathroom- gets the urge to go, has 10 seconds to make it to the bathroom, hard to get cat and dog off lap Pads: Yes:  1-2  INTERCOURSE: not active  PREGNANCY: none PROLAPSE: None   OBJECTIVE:  Note: Objective measures were completed at Evaluation unless otherwise noted.  DIAGNOSTIC FINDINGS:  None recently  PATIENT SURVEYS:    PFIQ-7: 143  COGNITION: Overall cognitive status: Within functional limits for tasks assessed     SENSATION: Light touch: Appears intact  LUMBAR SPECIAL TESTS:  Single leg stance test: Positive for dipping, more on lt  GAIT: Assistive device utilized: None Comments: antalgic  POSTURE: rounded shoulders and forward head   LUMBARAROM/PROM:   A/PROM A/PROM  Eval % availability  Flexion 60  Extension WFL  Right lateral flexion 80  Left lateral flexion 80  Right rotation 80  Left rotation 80   (Blank rows = not tested)  LOWER EXTREMITY ROM: bilateral hips full  LOWER EXTREMITY MMT: 4/5 grossly throughout  PALPATION:   General: DRA abdomen, low tone with doming, able to activate pelvic floor  Pelvic Alignment: even  Abdominal: doming , low tone                External Perineal Exam: mild dryness present, good clitoral hood mobility, reduced labia minora                            Internal Pelvic Floor: mild dryness, holds high tone, reported decreased sensation - more on left Has high tone and difficulty with bulging and lengthening of pelvic floor  Patient confirms identification and approves PT to assess internal pelvic floor and treatment   PELVIC MMT:   MMT eval  Vaginal 3/5, difficulty with lengthening  Internal Anal Sphincter Difficulty with bulging, 3/5(weakness present)  External Anal Sphincter 3/5, difficulty with bulging/ lengthening  Puborectalis   Diastasis Recti 2 fingers at umbilicus, doming present above umbilicus  (Blank rows = not tested)        TONE: high  PROLAPSE: None noticed  TODAY'S TREATMENT:                                                                                                                               DATE: 01/06/24 Second trial of transcutaneous tibial nerve stimulation for urinary urgency. Pt educated on electrode placement near medial malleoli bilateral feet and electrodes placed. Tens unit turned up until strong and comfortable sensation was felt in bilateral feet and sensation of toes curling and then turned down. Current ran for 20 mins. Pt was educated on turning off unit, electrode cleaning and storing and protocol was given for self care at home. Pt to set up unit per protocol and reach out if help is needed with adjusting parameters to 200 HZ and 20 ns.    Neuro reed- cat/ cow with diaphragmatic breathing  Manual - abdominal massage There ex- education on fiber, abdominal massage Reducing Miralax gradually, smooth move tea, benefiber   12/29/2023 Neuro reed- hip adduction with ball with transverse abdominis breath in hooklying with black ball 15 reps  Cat/ cow                      Child's pose modified with yoga mat and pillow under glutes and head for improved comfort  There ex- trial of TTNS- 20 mins - education on placement, protocol, where to purchase- [employer] vs [employer] Pt with near fall when taking her shoes off today Manual therapy- gentle hamstring and hip stretches- internal and external rotation in hooklying with diaphragmatic breathing                      12/08/2023    Neuro reed- pelvic floor  muscle contract/ relax/ bulge training vaginally and rectally with coordination with breathing Happy baby on a wall Child's pose     Therapeutic activities- strategies for self cathing    PATIENT EDUCATION/ there acts Education details: relevant core and pelvic floor anatomy, expectations of PT, exam findings Person educated: Patient Education method: Explanation, Demonstration, Tactile cues, Verbal cues, and Handouts Education comprehension: verbalized understanding, returned demonstration, verbal cues required, tactile cues required, and  needs further education  HOME EXERCISE PROGRAM: [id number]  ASSESSMENT:  CLINICAL IMPRESSION:  Patient is a 63 y.o. F who was seen today for physical therapy evaluation and treatment for urinary urgency and incontinence and fecal incontinence. Pt with poor tone abdominal musculature, decreased core coordination, decreased pelvic floor awareness, decreased hip strength, tightness throughout lumbar spine. She has high pelvic floor tone and decreased coordination and sensation throughout- more on left.   Trial of transcutaneous tibial nerve stimulation today, continued down training, pt with less urinary urgency,  Bristol stool scale type 4-7, does not like taking Miralax. Discussed reducing slowly and increasing fiber or taking Benefiber. Pt is progressing well towards goals. Will continue to benefit from PT to address deficits.      OBJECTIVE IMPAIRMENTS: Abnormal gait, decreased activity tolerance, decreased balance, decreased coordination, decreased knowledge of condition, decreased mobility, difficulty walking, decreased ROM, decreased strength, impaired tone, improper body mechanics, obesity, and pain.   ACTIVITY LIMITATIONS: lifting, continence, and toileting  PARTICIPATION LIMITATIONS: community activity  PERSONAL FACTORS: Time since onset of injury/illness/exacerbation are also affecting patient's functional outcome.   REHAB POTENTIAL: Good  CLINICAL DECISION MAKING: Evolving/moderate complexity  EVALUATION COMPLEXITY: Moderate   GOALS: Goals reviewed with patient? Yes  SHORT TERM GOALS: Target date: 12/30/2023    Pt will be independent with the knack, urge suppression technique, and double voiding in order to improve bladder habits and decrease urinary incontinence.   Baseline: Goal status: progressing  2.  Pt will be independent with HEP.   Baseline:  Goal status: progressing  3.  Pt will be able to participate in 40 minute PT session without increased hip and  shoulder pain Baseline: up to 8/10 Goal status: met  4.  Pt will be independent with use of squatty potty, relaxed toileting mechanics, and improved bowel movement techniques in order to increase ease of bowel movements and complete evacuation.   Baseline:  Goal status: INITIAL  LONG TERM GOALS: Target date: 06/03/2024  Pt will be independent with advanced HEP.   Baseline:  Goal status: INITIAL  2.  Pt will have improved PFIQ-7 by at least 30 points Baseline: 143 Goal status: INITIAL  3.  Pt will soak 0 pads/ day in order to reduce financial  burden Baseline: 2 Goal status: progressing  4.  Pt will be able to walk her dog without increased fecal or urinary urgency  for at least 1 hr Baseline:  Goal status: INITIAL  5.  Pt will be able to participate in community activities as needed without leaking Baseline:  Goal status: INITIAL    PLAN:  PT FREQUENCY: 1-2x/week  PT DURATION: 6 months  PLANNED INTERVENTIONS: 97110-Therapeutic exercises, 97530- Therapeutic activity, 97112- Neuromuscular re-education, 97535- Self Care, 16109- Manual therapy, 97032- Electrical stimulation (manual), Taping, Dry Needling, Joint mobilization, Joint manipulation, Spinal manipulation, Spinal mobilization, Scar mobilization, Cryotherapy, Moist heat, and Biofeedback  PLAN FOR NEXT SESSION: TTNS, cont coordination an strengthening exercises, add HS stretches with strap/ thick theraband    Tylar Amborn, PT 01/06/2024, 4:17 PM

## 2024-01-12 ENCOUNTER — Telehealth: Payer: Self-pay | Admitting: Physician Assistant

## 2024-01-12 DIAGNOSIS — Z0289 Encounter for other administrative examinations: Secondary | ICD-10-CM

## 2024-01-12 NOTE — Telephone Encounter (Signed)
 Received disability forms. Given to [facility] 6/23

## 2024-01-21 ENCOUNTER — Encounter: Payer: Self-pay | Admitting: Physician Assistant

## 2024-01-21 ENCOUNTER — Ambulatory Visit: Admitting: Physician Assistant

## 2024-01-21 DIAGNOSIS — G4700 Insomnia, unspecified: Secondary | ICD-10-CM | POA: Diagnosis not present

## 2024-01-21 DIAGNOSIS — F3181 Bipolar II disorder: Secondary | ICD-10-CM | POA: Diagnosis not present

## 2024-01-21 DIAGNOSIS — F411 Generalized anxiety disorder: Secondary | ICD-10-CM | POA: Diagnosis not present

## 2024-01-21 MED ORDER — MODAFINIL 100 MG PO TABS: 100.0000 mg | ORAL_TABLET | Freq: Every day | ORAL | 1 refills | Status: DC | PRN

## 2024-01-21 NOTE — Progress Notes (Signed)
 [facility]  Patient ID: Sarah Hogan,  MRN: [id number]  PCP: Oris Camie BRAVO, NP  Date of Evaluation: 01/21/2024 Time spent:20 minutes  Chief Complaint:  Chief Complaint   Anxiety; Depression; Insomnia; Follow-up    HISTORY/CURRENT STATUS: HPI For routine f/u  Weaned off Latuda  at LOV. Unable to sleep well at all since then. Might be a few nights a week where she gets only a few hours or no sleep at all. Able to feel emotions now, which has been nice, still can't cry but I've got nothing to cry about. Has no motivation since going off the Latuda .  The Latuda  did give her some energy to do things she liked to do.  Now she doesn't want to do anything.  Reads a lot. That's about it.  Modafinil  helps with motivation but she takes it only prn, like driving somewhere that she really needs to pay attention to, or paying bills for example.  Craving food more too, but unsure if it's related. Has had problems with food all her life. Went to [employer] today and several people said they had the same problem last week. No PA, takes Klonopin  occas which is helpful when overwhelmed. No delirium, psychosis, mania, SI/HI.   Denies dizziness, syncope, seizures, numbness, tingling, tremor, tics, unsteady gait, slurred speech, confusion. Denies muscle or joint pain, stiffness, or dystonia.  Individual Medical History/ Review of Systems: Changes? :No  Has long-term MS, since 1990.   Past medications for mental health diagnoses include: Geodon -Has been helpful for her depression. Worsening TD on 20 mg.  Latuda - Effective. Took samples.  Seroquel- Took briefly. Had severe fatigue Effexor - Taken for several years Zoloft- Was effective and then stopped working Prozac Remeron- Lost 80 lbs when she stopped taking it. Buspar Trileptal - Helps stabilize mood. Has taken long-term. Lamictal -Worsening depression Tranxene  Provigil  Trazodone   Allergies: Amoxicillin and Statins  Current  Medications:  Current Outpatient Medications:    baclofen (LIORESAL) 10 MG tablet, Take 10 mg by mouth 2 (two) times daily., Disp: , Rfl:    Biotin 5000 MCG CAPS, Take by mouth., Disp: , Rfl:    cholecalciferol (VITAMIN D3) 25 MCG (1000 UNIT) tablet, Take 1,000 Units by mouth daily., Disp: , Rfl:    Chromium Picolinate (CHROMIUM PICOLATE PO), Take by mouth., Disp: , Rfl:    clonazePAM  (KLONOPIN ) 0.5 MG tablet, Take 1 tablet (0.5 mg total) by mouth daily as needed for anxiety., Disp: 30 tablet, Rfl: 0   estradiol  (ESTRACE ) 0.1 MG/GM vaginal cream, Place 1g nightly for two weeks then twice a week after, Disp: 30 g, Rfl: 3   ezetimibe  (ZETIA ) 10 MG tablet, Take 1 tablet (10 mg total) by mouth daily., Disp: 90 tablet, Rfl: 3   ibuprofen  (ADVIL ) 800 MG tablet, Take 1 tablet (800 mg total) by mouth every 8 (eight) hours as needed., Disp: 90 tablet, Rfl: 1   metoprolol  succinate (TOPROL  XL) 50 MG 24 hr tablet, Take 1 tablet (50 mg total) by mouth daily. Take with or immediately following a meal., Disp: 90 tablet, Rfl: 3   mirabegron  ER (MYRBETRIQ ) 50 MG TB24 tablet, Take 1 tablet (50 mg total) by mouth daily., Disp: 90 tablet, Rfl: 2   mirabegron  ER (MYRBETRIQ ) 50 MG TB24 tablet, Take 1 tablet (50 mg total) by mouth daily., Disp: 30 tablet, Rfl: 3   OXcarbazepine  (TRILEPTAL ) 150 MG tablet, Take 1 tablet (150 mg total) by mouth 2 (two) times daily., Disp: 180 tablet, Rfl: 1   pregabalin (LYRICA)  50 MG capsule, Take 50 mg by mouth 2 (two) times daily., Disp: , Rfl:    traZODone  (DESYREL ) 100 MG tablet, Take 1 tablet (100 mg total) by mouth at bedtime as needed for sleep., Disp: 90 tablet, Rfl: 1   venlafaxine  XR (EFFEXOR -XR) 75 MG 24 hr capsule, TAKE ONE CAPSULE BY MOUTH DAILY WITH BREAKFAST, Disp: 90 capsule, Rfl: 1   modafinil  (PROVIGIL ) 100 MG tablet, Take 1 tablet (100 mg total) by mouth daily as needed (Driving)., Disp: 30 tablet, Rfl: 1   NONFORMULARY OR COMPOUNDED ITEM, Compounded progesterone  SR  100mg .  1 capsule daily., Disp: 90 each, Rfl: 3  Current Facility-Administered Medications:    ciprofloxacin  (CIPRO ) tablet 500 mg, 500 mg, Oral, Once,  Medication Side Effects: none  Family Medical/ Social History: Changes? No    MENTAL HEALTH EXAM:  Last menstrual period 06/28/2008.There is no height or weight on file to calculate BMI.  General Appearance: Casual, Well Groomed, and Obese  Eye Contact:  Good  Speech:  Clear and Coherent and Normal Rate  Volume:  Normal  Mood:  Euthymic  Affect:  Congruent  Thought Process:  Goal Directed and Descriptions of Associations: Circumstantial  Orientation:  Full (Time, Place, and Person)  Thought Content: Logical   Suicidal Thoughts:  No  Homicidal Thoughts:  No  Memory:  fair  Judgement:  Good  Insight:  Good  Psychomotor Activity:  Normal  Concentration:  Concentration: Fair and Attention Span: Fair  Recall:  Fair  Fund of Knowledge: Good  Language: Good  Assets:  Communication Skills Desire for Improvement Financial Resources/Insurance Resilience Social Support Transportation  ADL's:  Intact  Cognition: WNL  Prognosis:  Good   Her PCP follows her labs.  DIAGNOSES:    ICD-10-CM   1. Bipolar 2 disorder (HCC)  F31.81     2. Generalized anxiety disorder  F41.1     3. Insomnia, unspecified type  G47.00       Receiving Psychotherapy: No   RECOMMENDATIONS:  PDMP reviewed.  Lyrica filled 10/22/2023.  Klonopin  filled 02/05/2023. I provided approximately 20 minutes of face to face time during this encounter, including time spent before and after the visit in records review, medical decision making, counseling pertinent to today's visit, and charting.   We discussed options to help with energy and motivation.  I would recommend taking the modafinil  on a daily basis or at least more often than she takes it now.  She agrees with this plan.  We are both hopeful that when she has more energy and motivation to do things she will  sleep better at night.  In the meantime she has the trazodone  and Klonopin  to help her sleep if she needs it. Another option would be to add Abilify.  We agree that it is not a good option because of the risk of increased hunger and weight gain.  Continue Klonopin  0.5 mg, 1 p.o. daily as needed anxiety. Continue modafinil  100 mg, 1 p.o. daily.  (See above) Continue Trileptal  150 mg, 1 p.o. twice daily. Continue trazodone  100 mg, 1 p.o. nightly as needed. Continue Effexor  XR 75 mg, 1 p.o. daily. Return in 2 months.  Verneita Cooks, PA-C

## 2024-01-22 DIAGNOSIS — Z0289 Encounter for other administrative examinations: Secondary | ICD-10-CM

## 2024-01-22 NOTE — Telephone Encounter (Signed)
Paper work completed and given to [place] to review and sign.

## 2024-01-27 NOTE — Telephone Encounter (Signed)
 Paperwork completed, signed and faxed to [place]  [employer] [phone]

## 2024-02-18 ENCOUNTER — Ambulatory Visit (INDEPENDENT_AMBULATORY_CARE_PROVIDER_SITE_OTHER)

## 2024-02-18 ENCOUNTER — Other Ambulatory Visit (HOSPITAL_COMMUNITY)
Admission: RE | Admit: 2024-02-18 | Discharge: 2024-02-18 | Disposition: A | Discharge status: Home / Self Care | Source: Other Acute Inpatient Hospital | Attending: Obstetrics | Admitting: Obstetrics

## 2024-02-18 VITALS — BP 112/68 | HR 71 | Temp 98.5°F | Wt 214.0 lb

## 2024-02-18 DIAGNOSIS — R319 Hematuria, unspecified: Secondary | ICD-10-CM

## 2024-02-18 DIAGNOSIS — R32 Unspecified urinary incontinence: Secondary | ICD-10-CM

## 2024-02-18 DIAGNOSIS — R1030 Lower abdominal pain, unspecified: Secondary | ICD-10-CM

## 2024-02-18 DIAGNOSIS — R82998 Other abnormal findings in urine: Secondary | ICD-10-CM | POA: Insufficient documentation

## 2024-02-18 DIAGNOSIS — N390 Urinary tract infection, site not specified: Secondary | ICD-10-CM

## 2024-02-18 DIAGNOSIS — R300 Dysuria: Secondary | ICD-10-CM | POA: Diagnosis not present

## 2024-02-18 LAB — URINALYSIS, ROUTINE W REFLEX MICROSCOPIC
Bilirubin Urine: NEGATIVE
Glucose, UA: NEGATIVE mg/dL
Ketones, ur: NEGATIVE mg/dL
Nitrite: NEGATIVE
Protein, ur: 100 mg/dL — AB
RBC / HPF: >50 RBC/hpf (ref 0–5)
Specific Gravity, Urine: 1.016 (ref 1.005–1.030)
WBC, UA: >50 WBC/hpf (ref 0–5)
pH: 7 (ref 5.0–8.0)

## 2024-02-18 LAB — POCT URINALYSIS DIP (CLINITEK)
Bilirubin, UA: NEGATIVE
Glucose, UA: NEGATIVE mg/dL
Nitrite, UA: NEGATIVE
POC PROTEIN,UA: 100 — AB
Spec Grav, UA: 1.015 (ref 1.010–1.025)
Urobilinogen, UA: 0.2 U/dL
pH, UA: 7.5 (ref 5.0–8.0)

## 2024-02-18 MED ORDER — NITROFURANTOIN MONOHYD MACRO 100 MG PO CAPS: 100.0000 mg | ORAL_CAPSULE | Freq: Two times a day (BID) | ORAL | 0 refills | Status: DC

## 2024-02-18 MED ORDER — PHENAZOPYRIDINE HCL 200 MG PO TABS: 200.0000 mg | ORAL_TABLET | Freq: Three times a day (TID) | ORAL | 0 refills | Status: AC | PRN

## 2024-02-18 NOTE — Patient Instructions (Signed)
 We have sent your urine out for additional testing for positive infection   .

## 2024-02-18 NOTE — Progress Notes (Signed)
 Sarah Hogan arrived today with dysuria, lower abdominal pain, and urinary incontinence. Patient is notexperiencing fever, unstable vitals and/or one-sided back flank pain. Patient has not had had a recent hospitalization due to UTI.  Last visit in the office was 12/11/2023.  Per protocol:   The most recent Urinalysis completed on 12-11-2023 and was not normal.  Last Creatinine level  Lab Results  Component Value Date   CREATININE 1.10 (H) 10/28/2023    An urine specimen was collected and POCT urinalysis completed. [x] A cath specimen was collected due to patient's current condition, symptoms or post-procedural state.  Total urine output by catheter is  Output by Drain (mL) 02/16/24 0701 - 02/16/24 1900 02/16/24 1901 - 02/17/24 0700 02/17/24 0701 - 02/17/24 1900 02/17/24 1901 - 02/18/24 0700 02/18/24 0701 - 02/18/24 1410  Patient has no LDAs of requested type attached.    Sarah Hogan    POCT Urine results is not normal.  Urine micro was sent per protocol for abnormal urinalysis.  Urine culture was sent per protocol for abnormal urinalysis.     [x] Pt was notified of positive urine results and plan for additional urine testing. We will contact you within the next 3-4 days with these results.  [] No Prescription was sent to your pharmacy.  The additional testing will indicate if a prescription is needed.   [x] Patient was notified of abnormal urine results. The following prescription is sent to your preferred pharmacy.  [x]  Macrobid  100mg  #10 1 tablet by mouth twice daily with food for 5 days      []  Bactrim  DS 800-160mg  #6 1 tablet by mouth twice daily for 3 days        []  Due to your current medication allergies, an alternate prescription was discussed with your provider and will be prescribed and sent to your pharmacy.  [] You can take over the counter AZO two tablets up to three times a day for two days.  Take AZO tablets with a full glass of water. AZO will turn your urine orange, this is normal.   [] The  patient was notified of negative urine results.  If symptoms persist, you may take over the counter AZO two tablets up to three times a day for two days.  AZO will turn your urine orange, this is normal.  Contact the office back to schedule an appointment if your symptoms persist or worsen or you develop additional symptoms.       CC'd note to patient's provider.

## 2024-02-19 ENCOUNTER — Ambulatory Visit: Payer: Self-pay | Admitting: Obstetrics

## 2024-02-19 DIAGNOSIS — N390 Urinary tract infection, site not specified: Secondary | ICD-10-CM

## 2024-02-19 LAB — URINE CULTURE: Culture: <10000 — AB

## 2024-02-19 MED ORDER — METHENAMINE HIPPURATE 1 G PO TABS: 1.0000 g | ORAL_TABLET | Freq: Two times a day (BID) | ORAL | 2 refills | Status: DC

## 2024-02-22 ENCOUNTER — Other Ambulatory Visit: Payer: Self-pay | Admitting: Physician Assistant

## 2024-02-22 DIAGNOSIS — F411 Generalized anxiety disorder: Secondary | ICD-10-CM

## 2024-03-12 ENCOUNTER — Ambulatory Visit: Admitting: Obstetrics

## 2024-03-12 ENCOUNTER — Encounter: Payer: Self-pay | Admitting: Obstetrics

## 2024-03-12 ENCOUNTER — Ambulatory Visit: Payer: Self-pay | Admitting: Obstetrics

## 2024-03-12 VITALS — BP 100/79 | HR 64

## 2024-03-12 DIAGNOSIS — R339 Retention of urine, unspecified: Secondary | ICD-10-CM

## 2024-03-12 DIAGNOSIS — N390 Urinary tract infection, site not specified: Secondary | ICD-10-CM

## 2024-03-12 DIAGNOSIS — R159 Full incontinence of feces: Secondary | ICD-10-CM

## 2024-03-12 DIAGNOSIS — N3946 Mixed incontinence: Secondary | ICD-10-CM | POA: Diagnosis not present

## 2024-03-12 DIAGNOSIS — Z87440 Personal history of urinary (tract) infections: Secondary | ICD-10-CM | POA: Diagnosis not present

## 2024-03-12 DIAGNOSIS — M6289 Other specified disorders of muscle: Secondary | ICD-10-CM | POA: Diagnosis not present

## 2024-03-12 LAB — POCT URINALYSIS DIP (CLINITEK)
Bilirubin, UA: NEGATIVE
Glucose, UA: NEGATIVE mg/dL
Ketones, POC UA: NEGATIVE mg/dL
Nitrite, UA: POSITIVE — AB
POC PROTEIN,UA: NEGATIVE
Spec Grav, UA: 1.02 (ref 1.010–1.025)
Urobilinogen, UA: 0.2 U/dL
pH, UA: 7.5 (ref 5.0–8.0)

## 2024-03-12 MED ORDER — SULFAMETHOXAZOLE-TRIMETHOPRIM 800-160 MG PO TABS: 1.0000 | ORAL_TABLET | Freq: Two times a day (BID) | ORAL | 0 refills | Status: AC

## 2024-03-12 NOTE — Assessment & Plan Note (Signed)
-   continues pelvic floor PT - tried Oxybutynin , mirabegron  25mg , and PTNS in the past  - We discussed the symptoms of overactive bladder (OAB), which include urinary urgency, urinary frequency, nocturia, with or without urge incontinence.  While we do not know the exact etiology of OAB, several treatment options exist. We discussed management including behavioral therapy (decreasing bladder irritants, urge suppression strategies, timed voids, bladder retraining), physical therapy, medication; for refractory cases posterior tibial nerve stimulation, sacral neuromodulation, and intravesical botulinum toxin injection.  For refractory OAB we reviewed the procedure for intravesical Botox injection with cystoscopy in the office and reviewed the risks, benefits and alternatives of treatment including but not limited to infection, need for self-catheterization and need for repeat therapy.  We discussed that there is a 5-15% chance of needing to catheterize with Botox and that this usually resolves in a few months; however can persist for longer periods of time.  Typically Botox injections would need to be repeated every 3-12 months since this is not a permanent therapy.   We discussed the role of sacral neuromodulation and how it works. It requires a test phase, and documentation of bladder function via diary. After a successful test period, a permanent wire and generator are placed in the OR. The battery lasts 5 years on average and would need to be replaced surgically.  The goal of this therapy is at least a 50% improvement in symptoms. It is NOT realistic to expect a 100% cure.  We reviewed the fact that about 30% of patients fail the test phase and are not candidates for permanent generator placement.  We discussed the risk of infection and that the patient would have to switch to MRI compatible mode during imaging once the device is placed. There are two companies that provide this therapy: [employer] and [employer]  that both offer rechargeable or non-rechargeable options. For all procedures, we discussed risks of bleeding, infection, damage to surrounding organs including bowel, bladder, blood vessels, ureters and nerves, need for further surgery, risk of postoperative urinary incontinence or retention with need to catheterize, recurrent prolapse, numbness and weakness at any body site, buttock pain, and the rarer risks of blood clot, heart attack, pneumonia, death.    We also discussed the role of percutaneous tibial nerve stimulation and how it works.  She understands it requires 12 weekly visits for temporary neuromodulation of the sacral nerve roots via the tibial nerve and that she may then require continued tapered treatment.  She will return for the procedure. All questions were answered. - discussed UDS 10/29/23 finding of Pdet at Qmax of 0 and valsalva void with low flow.  - unable to perform CIC  - discussed SNM due to non-obstructive urinary retention noted, reviewed handout and encouraged pt to consider options. Patient considering SNM

## 2024-03-12 NOTE — Assessment & Plan Note (Signed)
-   UDS 10/29/23 with elevated PVR and overflow urinary incontinence with no detrusor pressure and valsalva void with low flow Qmax 1mL/sec. Synergic EMG - prior UDS 2019 with high pressure Pdet at Qmax 36 and low flow with Qmax at 6 after removal of catheter. PVR 74mL with synergic EMG.  - reviewed options for bladder emptying including indwelling foley, CIC, suprapubic catheter, SNM or diversion/augmentation. Patient desires to proceed with SNM. Pt provided permission and desires to receive call from representative to speak with other patients prior to proceeding.  - patient reports prior CIC teaching with no postvoid residual, patient reports difficulty with CIC and performed by palpation due to difficulty with visualization. Reviewed difference in sensation with intraurethral vs. Vaginal catheter placement - pt performed CIC in the office with some assistance. Discontinued due to difficulty.  - catheterized for today, pending urine culture to r/o UTI

## 2024-03-12 NOTE — Assessment & Plan Note (Addendum)
-   For treatment of recurrent urinary tract infections, we discussed management of recurrent UTIs including prophylaxis with a daily low dose antibiotic, transvaginal estrogen therapy, D-mannose, and cranberry supplements.   - cystoscopy 12/11/23 noted inflammation and white sedimentation without focal lesion  - Renal US  10/22/23 negative - continue low dose vaginal estrogen - denies pyelonephritis or history of kidney stone - Cr 1.1 on 10/28/23 - pending catheterized urine testing for pathnostics due to refractory UTI symptoms despite macrobid  use and culture < 10K CFU after GI distress with diarrhea - Rx Bactrim  if symptoms worsens.  - dislikes methenamine  due to mouth ulcers, consider keflex 250mg  for UTI suppression if urine testing positive for infection

## 2024-03-12 NOTE — Progress Notes (Signed)
 [facility] Return Visit  SUBJECTIVE  History of Present Illness: Sarah Hogan is a 63 y.o. female seen in follow-up for mixed urinary incontinence with multiple sclerosis, nocturia, recurrent UTI, fecal incontinence, slow transit constipation, elevated Cr, and high tone pelvic floor dysfunction. Plan at last visit was resumption of miralax titration, continue vaginal estrogen, referral to pelvic floor PT, topical lidocaine  with dilator use, CIC.   UTI symptoms with intermittent 5/10 suprapubic bladder pain since 02/18/24, managed by pyridium  every 3 days Denies fever, chills, N/V, back pain, hematuria.  Takes Ibuprofen  800mg  every other night for TMJ Brought clean catch urine samples at 8am this morning.  Urinary leakage started 2 days ago during nightly walk, managed by 2-3 depends/day Forgets vaginal estrogen and stopped using 1-2 months ago.  Took D-mannose before, however has not filled up her supplements containers for weeks due to illness.  Reports difficulty with sleep recently attributed to viral illness with 9lb weight loss and diarrhea for 2 weeks Stopped CIC due to difficulty and threw away catheters Started methanemine 1 week ago, dislikes due to mouth sores  Cystoscopy 12/11/23 noted inflammation and white sedimentation without focal lesion  Reports cramping with urination after stopping miralax 1.5 capful/day for 2 days on gemtesa . UA + leuk, culture 30K Klebsiella Oxytoca resistant to ampicillin.  02/18/24 presented with dysuria, lower abdominal pain and urinary incontinence. UA + leuk/protein/heme/ketones. Urine culture with < 10K growth. Rx macrobid   H/o worsening leakage and fecal leakage attributed to allergies. Prior UTI like symptoms with vaginal estrogen use, resolved 1 week ago.  Started Pelvic floor PT with increased awareness of bladder sensation Has not started vaginal dilator use with lidocaine   - UDS in 2019 with increased sensation, decreased MCC,  DOI and no SUI. Elevated Pdet at Qmax 36 and low flow with Qmax at 6 after removal of catheter. PVR 74mL with catheter removed. Synergic EMG. 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  Urodynamic Impression 10/29/23:  1. Sensation was reduced; capacity was normal at 2. Stress Incontinence was demonstrated at normal pressures; MUCP 65cmH2O 3. Detrusor Overactivity was not demonstrated. 4. Emptying was dysfunctional with a elevated PVR , a sustained detrusor contraction not present,  abdominal straining present, normal urethral sphincter activity with synergic EMG. Low flow with Qmax 87mL/sec, Pdet at Qmax 0.   - tried pelvic floor PT, Oxybutynin , mirabegron  25mg , and possible PTNS in the past   Urine testing: - 10/14/23 UA + heme, 6-10 RBCs/hpf with negative cystoscopy 11/27/23 - treated for UTI with lower abdominal pain and dysuria with Macrobid  on 10/23/23. UA + heme/leuk, microscopy + heme. Culture 60K pansensitive Citrobacter Freundii.   - Cr 1.01 on 10/22/22, 1.1 on 10/28/23 - 10/29/23 UA + heme, 6-10 RBC/hpf   Renal US  10/22/23 negative  Past Medical History: Patient  has a past medical history of Allergy, Anxiety, Aortic insufficiency, Bipolar 2 disorder (HCC), Cervical polyp (12/16/2018), Depression, Former smoker (12/16/2018), Hyperlipidemia, Leukopenia, Mild aortic insufficiency, Mild mitral regurgitation, MS (multiple sclerosis) (HCC), Neuromuscular disorder (HCC), Normal coronary arteries (cath 2015), Overactive bladder, PVC's (premature ventricular contractions) (04/09/2016), SVT (supraventricular tachycardia) (HCC) (04/22/2017), and Vitamin D  deficiency.   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   Medications: She has  a current medication list which includes the following prescription(s): baclofen, biotin, cholecalciferol,  chromium picolinate, clonazepam , estradiol , ezetimibe , ibuprofen , methenamine , metoprolol  succinate, mirabegron  er, modafinil , NONFORMULARY OR COMPOUNDED ITEM, oxcarbazepine , phenazopyridine , pregabalin, sulfamethoxazole -trimethoprim , trazodone , and venlafaxine  xr.   Allergies: Patient is allergic to amoxicillin and statins.   Social History: Patient  reports that she quit smoking about 29 years ago. Her smoking use included cigarettes. She started smoking about 49 years ago. She has a 30 pack-year smoking history. She has never used smokeless tobacco. She reports current alcohol use. She reports that she does not use drugs.     OBJECTIVE     Physical Exam: Vitals:   03/12/24 1016  BP: 100/79  Pulse: 64   Gen: No apparent distress, A&O x 3.  Straight Catheterization Procedure for PVR: After verbal consent was obtained from the patient for catheterization to assess bladder emptying and residual volume the urethra and surrounding tissues were prepped with betadine and an in and out catheterization was performed.  PVR was .  Urine appeared clear yellow. The patient tolerated the procedure well.    Lab Results  Component Value Date   COLORU yellow 03/12/2024   CLARITYU clear 03/12/2024   GLUCOSEUR negative 03/12/2024   BILIRUBINUR negative 03/12/2024   KETONESU Negative 12/11/2023   SPECGRAV 1.020 03/12/2024   RBCUR small (A) 03/12/2024   PHUR 7.5 03/12/2024   PROTEINUR 100 (A) 02/18/2024   UROBILINOGEN 0.2 03/12/2024   LEUKOCYTESUR Moderate (2+) (A) 03/12/2024   Lab Results  Component Value Date   CREATININE 1.10 (H) 10/28/2023      ASSESSMENT AND PLAN    [name] is a 63 y.o. with:  1. Recurrent UTI   2. Mixed stress and urge urinary incontinence   3. Incomplete bladder emptying   4. High-tone pelvic floor dysfunction   5. Incontinence of feces,  unspecified fecal incontinence type      Recurrent UTI Assessment & Plan: - For treatment of recurrent urinary tract infections, we discussed management of recurrent UTIs including prophylaxis with a daily low dose antibiotic, transvaginal estrogen therapy, D-mannose, and cranberry supplements.   - cystoscopy 12/11/23 noted inflammation and white sedimentation without focal lesion  - Renal US  10/22/23 negative - continue low dose vaginal estrogen - denies pyelonephritis or history of kidney stone - Cr 1.1 on 10/28/23 - pending catheterized urine testing for pathnostics due to refractory UTI symptoms despite macrobid  use and culture < 10K CFU after GI distress with diarrhea - Rx Bactrim  if symptoms worsens.  - dislikes methenamine  due to mouth ulcers, consider keflex 250mg  for UTI suppression if urine testing positive for infection  Orders: -     Sulfamethoxazole -Trimethoprim ; Take 1 tablet by mouth 2 (two) times daily for 3 days.  Dispense: 6 tablet; Refill: 0 -     POCT URINALYSIS DIP (CLINITEK) -     Pathnostics Molecular Test; Future  Mixed stress and urge urinary incontinence Assessment & Plan: - continues pelvic floor PT - tried Oxybutynin , mirabegron  25mg , and PTNS in the past  - We discussed the symptoms of overactive bladder (OAB), which include urinary urgency, urinary frequency, nocturia, with or without urge incontinence.  While we do not know the exact etiology of OAB, several treatment options exist. We discussed management including behavioral therapy (decreasing bladder irritants, urge suppression strategies, timed voids, bladder retraining), physical therapy, medication; for refractory cases posterior tibial nerve stimulation, sacral neuromodulation, and intravesical botulinum toxin injection.  For refractory OAB we reviewed the procedure for intravesical Botox injection with cystoscopy in the office and reviewed the risks, benefits and  alternatives of treatment including but not  limited to infection, need for self-catheterization and need for repeat therapy.  We discussed that there is a 5-15% chance of needing to catheterize with Botox and that this usually resolves in a few months; however can persist for longer periods of time.  Typically Botox injections would need to be repeated every 3-12 months since this is not a permanent therapy.   We discussed the role of sacral neuromodulation and how it works. It requires a test phase, and documentation of bladder function via diary. After a successful test period, a permanent wire and generator are placed in the OR. The battery lasts 5 years on average and would need to be replaced surgically.  The goal of this therapy is at least a 50% improvement in symptoms. It is NOT realistic to expect a 100% cure.  We reviewed the fact that about 30% of patients fail the test phase and are not candidates for permanent generator placement.  We discussed the risk of infection and that the patient would have to switch to MRI compatible mode during imaging once the device is placed. There are two companies that provide this therapy: [employer] and [employer] that both offer rechargeable or non-rechargeable options. For all procedures, we discussed risks of bleeding, infection, damage to surrounding organs including bowel, bladder, blood vessels, ureters and nerves, need for further surgery, risk of postoperative urinary incontinence or retention with need to catheterize, recurrent prolapse, numbness and weakness at any body site, buttock pain, and the rarer risks of blood clot, heart attack, pneumonia, death.    We also discussed the role of percutaneous tibial nerve stimulation and how it works.  She understands it requires 12 weekly visits for temporary neuromodulation of the sacral nerve roots via the tibial nerve and that she may then require continued tapered treatment.  She will return for the procedure. All questions were answered. - discussed UDS  10/29/23 finding of Pdet at Qmax of 0 and valsalva void with low flow.  - unable to perform CIC  - discussed SNM due to non-obstructive urinary retention noted, reviewed handout and encouraged pt to consider options. Patient considering SNM    Incomplete bladder emptying Assessment & Plan: - UDS 10/29/23 with elevated PVR and overflow urinary incontinence with no detrusor pressure and valsalva void with low flow Qmax 55mL/sec. Synergic EMG - prior UDS 2019 with high pressure Pdet at Qmax 36 and low flow with Qmax at 6 after removal of catheter. PVR 74mL with synergic EMG.  - reviewed options for bladder emptying including indwelling foley, CIC, suprapubic catheter, SNM or diversion/augmentation. Patient desires to proceed with SNM. Pt provided permission and desires to receive call from representative to speak with other patients prior to proceeding.  - patient reports prior CIC teaching with no postvoid residual, patient reports difficulty with CIC and performed by palpation due to difficulty with visualization. Reviewed difference in sensation with intraurethral vs. Vaginal catheter placement - pt performed CIC in the office with some assistance. Discontinued due to difficulty.  - catheterized for today, pending urine culture to r/o UTI   High-tone pelvic floor dysfunction Assessment & Plan: - prior pelvic floor PT with Kegel exercises and reported increased discomfort and frequency of UTIs - undergoing pelvic floor PT with improvement of symptoms - The origin of pelvic floor muscle spasm can be multifactorial, including primary, reactive to a different pain source, trauma, or even part of a centralized pain syndrome.Treatment options include pelvic floor  physical therapy, local (vaginal) or oral  muscle relaxants, pelvic muscle trigger point injections or centrally acting pain medications.   - encouraged vaginal lubrication with topical lidocaine  for comfort if patient desires to resume  vaginal dilator use   Incontinence of feces, unspecified fecal incontinence type Assessment & Plan: - possibly multifactorial due to constipation, high tone pelvic floor, MS, and peripheral neuropathy - Treatment options include anti-diarrhea medication (loperamide/ Imodium OTC or prescription lomotil), fiber supplements, physical therapy, and possible sacral neuromodulation or surgery.   - prior titration of miralax, recent disruption due to GI distress with diarrhea and stopped 2 days ago with increased urinary tract symptoms. Encouraged to resume Miralax or fiber supplementation to optimize stool consistency - pelvic floor PT with improvement per pt - reviewed SNM and possible treatment for bother fecal and urinary incontinence symptoms. Provided handout and reviewed with patient, considering and will proceed with repeat diary prior to PNE.    Time spent: I spent 52 minutes dedicated to the care of this patient on the date of this encounter to include pre-visit review of records, face-to-face time with the patient discussing neurogenic bladder with incomplete bladder emptying, mixed urinary incontinence, abnormal urinalysis, fecal incontinence, high tone pelvic floor, vaginal atrophy, and post visit documentation and ordering medication/ testing.   Lianne ONEIDA Gillis, MD

## 2024-03-12 NOTE — Assessment & Plan Note (Addendum)
-   possibly multifactorial due to constipation, high tone pelvic floor, MS, and peripheral neuropathy - Treatment options include anti-diarrhea medication (loperamide/ Imodium OTC or prescription lomotil), fiber supplements, physical therapy, and possible sacral neuromodulation or surgery.   - prior titration of miralax, recent disruption due to GI distress with diarrhea and stopped 2 days ago with increased urinary tract symptoms. Encouraged to resume Miralax or fiber supplementation to optimize stool consistency - pelvic floor PT with improvement per pt - reviewed SNM and possible treatment for bother fecal and urinary incontinence symptoms. Provided handout and reviewed with patient, considering and will proceed with repeat diary prior to PNE.

## 2024-03-12 NOTE — Patient Instructions (Addendum)
 We will send your urine for testing.   Please start bactrim  twice a day if you continue to experience worsening urinary symptoms.  Please call if you continue to experience persistent or worsening urinary symptoms such as fever > 100.4, nausea/vomiting, one sided back pain or blood in your urine.   Resume vaginal estrogen.  For vaginal atrophy (thinning of the vaginal tissue that can cause dryness and burning) and UTI prevention we discussed estrogen replacement in the form of vaginal cream.   Start vaginal estrogen therapy nightly for two weeks then 2 times weekly at night. This can be placed with your finger or an applicator inside the vagina and around the urethra.  Please let us  know if the prescription is too expensive and we can look for alternative options.   Is vaginal estrogen therapy safe for me? Vaginal estrogen preparations act on the vaginal skin, and only a very tiny amount is absorbed into the bloodstream (0.01%).  They work in a similar way to hand or face cream.  There is minimal absorption and they are therefore perfectly safe. If you have had breast cancer and have persistent troublesome symptoms which aren't settling with vaginal moisturisers and lubricants, local estrogen treatment may be a possibility, but consultation with your oncologist should take place first.   We discussed the role of sacral neuromodulation and how it works. It requires a test phase, and documentation of bladder function via diary. After a successful test period, a permanent wire and generator are placed in the OR. The battery lasts 5 years on average and would need to be replaced surgically.  The goal of this therapy is at least a 50% improvement in symptoms. It is NOT realistic to expect a 100% cure.  We reviewed the fact that about 30% of patients fail the test phase and are not candidates for permanent generator placement.  We discussed the risk of infection and that the patient would not be able to get  an MRI once the device is placed. There are two companies that provide this therapy: [employer] and [employer]. [employer]' product is new and is similar to [employer]'s, but has advantages of a smaller and rechargeable battery and being able to have an MRI with the implant. For all procedures, we discussed risks of bleeding, infection, damage to surrounding organs including bowel, bladder, blood vessels, ureters and nerves, need for further surgery, risk of postoperative urinary incontinence or retention with need to catheterize, recurrent prolapse, numbness and weakness at any body site, buttock pain, and the rarer risks of blood clot, heart attack, pneumonia, death.    With your permission, representative from sacral neuromodulation will reach out to you regarding your bladder symptoms.

## 2024-03-12 NOTE — Assessment & Plan Note (Signed)
-   prior pelvic floor PT with Kegel exercises and reported increased discomfort and frequency of UTIs - undergoing pelvic floor PT with improvement of symptoms - The origin of pelvic floor muscle spasm can be multifactorial, including primary, reactive to a different pain source, trauma, or even part of a centralized pain syndrome.Treatment options include pelvic floor physical therapy, local (vaginal) or oral  muscle relaxants, pelvic muscle trigger point injections or centrally acting pain medications.   - encouraged vaginal lubrication with topical lidocaine  for comfort if patient desires to resume vaginal dilator use

## 2024-03-18 ENCOUNTER — Other Ambulatory Visit: Payer: Self-pay | Admitting: *Deleted

## 2024-03-18 ENCOUNTER — Encounter (HOSPITAL_BASED_OUTPATIENT_CLINIC_OR_DEPARTMENT_OTHER): Payer: Self-pay

## 2024-03-18 DIAGNOSIS — N390 Urinary tract infection, site not specified: Secondary | ICD-10-CM

## 2024-03-25 ENCOUNTER — Other Ambulatory Visit: Payer: Self-pay | Admitting: Obstetrics & Gynecology

## 2024-03-25 DIAGNOSIS — Z1231 Encounter for screening mammogram for malignant neoplasm of breast: Secondary | ICD-10-CM

## 2024-03-26 ENCOUNTER — Ambulatory Visit
Admission: RE | Admit: 2024-03-26 | Discharge: 2024-03-26 | Disposition: A | Discharge status: Home / Self Care | Source: Ambulatory Visit | Attending: Obstetrics & Gynecology | Admitting: Obstetrics & Gynecology

## 2024-03-26 DIAGNOSIS — Z1231 Encounter for screening mammogram for malignant neoplasm of breast: Secondary | ICD-10-CM

## 2024-03-27 ENCOUNTER — Other Ambulatory Visit: Payer: Self-pay | Admitting: Physician Assistant

## 2024-03-27 ENCOUNTER — Other Ambulatory Visit: Payer: Self-pay | Admitting: Cardiology

## 2024-03-27 DIAGNOSIS — F3181 Bipolar II disorder: Secondary | ICD-10-CM

## 2024-03-27 DIAGNOSIS — F411 Generalized anxiety disorder: Secondary | ICD-10-CM

## 2024-03-30 DIAGNOSIS — D3132 Benign neoplasm of left choroid: Secondary | ICD-10-CM | POA: Diagnosis not present

## 2024-03-30 DIAGNOSIS — H43813 Vitreous degeneration, bilateral: Secondary | ICD-10-CM | POA: Diagnosis not present

## 2024-03-30 DIAGNOSIS — H2513 Age-related nuclear cataract, bilateral: Secondary | ICD-10-CM | POA: Diagnosis not present

## 2024-03-31 ENCOUNTER — Ambulatory Visit (INDEPENDENT_AMBULATORY_CARE_PROVIDER_SITE_OTHER): Admitting: Physician Assistant

## 2024-03-31 ENCOUNTER — Encounter: Payer: Self-pay | Admitting: Physician Assistant

## 2024-03-31 DIAGNOSIS — G4700 Insomnia, unspecified: Secondary | ICD-10-CM

## 2024-03-31 DIAGNOSIS — F3181 Bipolar II disorder: Secondary | ICD-10-CM | POA: Diagnosis not present

## 2024-03-31 DIAGNOSIS — F411 Generalized anxiety disorder: Secondary | ICD-10-CM | POA: Diagnosis not present

## 2024-03-31 MED ORDER — MODAFINIL 100 MG PO TABS: 100.0000 mg | ORAL_TABLET | Freq: Every day | ORAL | 5 refills | Status: AC | PRN

## 2024-03-31 MED ORDER — CLONAZEPAM 0.5 MG PO TABS: 0.5000 mg | ORAL_TABLET | Freq: Every day | ORAL | 5 refills | Status: AC | PRN

## 2024-03-31 NOTE — Progress Notes (Signed)
 [facility]  Patient ID: Sarah Hogan,  MRN: [id number]  PCP: Oris Camie BRAVO, NP  Date of Evaluation: 03/31/2024 Time spent:20 minutes  Chief Complaint:  Chief Complaint   Anxiety; Insomnia; Depression; Follow-up    HISTORY/CURRENT STATUS: HPI For routine f/u  Doing well.  Walks her dog daily for 1 mile.  She's eating well, going to [employer], has lost 20# this year.  Still has trouble sleeping some times. Trazodone  helps.  Energy and motivation are better since being on the Modafinil .   No extreme sadness, tearfulness, or feelings of hopelessness.  ADLs and personal hygiene are normal.  No change in memory.  Anxiety is controlled.  Klonopin  helps when needed.  No SI/HI.   No reports of increased energy with decreased need for sleep, increased talkativeness, racing thoughts, impulsivity or risky behaviors, increased spending, grandiosity, increased irritability or anger, paranoia, or hallucinations.  Individual Medical History/ Review of Systems: Changes? :Yes  had several UTIs since LOV, antibiotics given.  Has long-term MS, since 1990.   Past medications for mental health diagnoses include: Geodon -Has been helpful for her depression. Worsening TD on 20 mg.  Latuda - Effective. Took samples.  Seroquel- Took briefly. Had severe fatigue Effexor - Taken for several years Zoloft- Was effective and then stopped working Prozac Remeron- Lost 80 lbs when she stopped taking it. Buspar Trileptal - Helps stabilize mood. Has taken long-term. Lamictal -Worsening depression Tranxene  Provigil  Trazodone   Allergies: Amoxicillin and Statins  Current Medications:  Current Outpatient Medications:    baclofen (LIORESAL) 10 MG tablet, Take 10 mg by mouth 2 (two) times daily., Disp: , Rfl:    Biotin 5000 MCG CAPS, Take by mouth., Disp: , Rfl:    cholecalciferol (VITAMIN D3) 25 MCG (1000 UNIT) tablet, Take 1,000 Units by mouth daily., Disp: , Rfl:    Chromium Picolinate (CHROMIUM  PICOLATE PO), Take by mouth., Disp: , Rfl:    estradiol  (ESTRACE ) 0.1 MG/GM vaginal cream, Place 1g nightly for two weeks then twice a week after, Disp: 30 g, Rfl: 3   ezetimibe  (ZETIA ) 10 MG tablet, Take 1 tablet (10 mg total) by mouth daily., Disp: 90 tablet, Rfl: 3   ibuprofen  (ADVIL ) 800 MG tablet, Take 1 tablet (800 mg total) by mouth every 8 (eight) hours as needed., Disp: 90 tablet, Rfl: 1   methenamine  (HIPREX ) 1 g tablet, Take 1 tablet (1 g total) by mouth 2 (two) times daily with a meal., Disp: 60 tablet, Rfl: 2   metoprolol  succinate (TOPROL -XL) 50 MG 24 hr tablet, TAKE 1 TABLET BY MOUTH DAILY TAKE WITH OR IMMEDIATELY FOLLOWING A MEAL, Disp: 90 tablet, Rfl: 1   mirabegron  ER (MYRBETRIQ ) 50 MG TB24 tablet, Take 1 tablet (50 mg total) by mouth daily., Disp: 30 tablet, Rfl: 3   NONFORMULARY OR COMPOUNDED ITEM, Compounded progesterone  SR 100mg .  1 capsule daily., Disp: 90 each, Rfl: 3   OXcarbazepine  (TRILEPTAL ) 150 MG tablet, Take 1 tablet (150 mg total) by mouth 2 (two) times daily., Disp: 180 tablet, Rfl: 1   phenazopyridine  (PYRIDIUM ) 200 MG tablet, Take 1 tablet (200 mg total) by mouth 3 (three) times daily as needed for pain., Disp: 10 tablet, Rfl: 0   pregabalin (LYRICA) 50 MG capsule, Take 50 mg by mouth 2 (two) times daily., Disp: , Rfl:    traZODone  (DESYREL ) 100 MG tablet, Take 1 tablet (100 mg total) by mouth at bedtime as needed for sleep., Disp: 90 tablet, Rfl: 1   venlafaxine  XR (EFFEXOR -XR) 75 MG 24 hr  capsule, TAKE 1 CAPSULE BY MOUTH DAILY WITH BREAKFAST, Disp: 90 capsule, Rfl: 1   clonazePAM  (KLONOPIN ) 0.5 MG tablet, Take 1 tablet (0.5 mg total) by mouth daily as needed for anxiety., Disp: 30 tablet, Rfl: 5   modafinil  (PROVIGIL ) 100 MG tablet, Take 1 tablet (100 mg total) by mouth daily as needed (Driving)., Disp: 30 tablet, Rfl: 5 Medication Side Effects: none  Family Medical/ Social History: Changes? No    MENTAL HEALTH EXAM:  Last menstrual period 06/28/2008.There is  no height or weight on file to calculate BMI.  General Appearance: Casual and Well Groomed  Eye Contact:  Good  Speech:  Clear and Coherent and Normal Rate  Volume:  Normal  Mood:  Euthymic  Affect:  Congruent  Thought Process:  Goal Directed and Descriptions of Associations: Circumstantial  Orientation:  Full (Time, Place, and Person)  Thought Content: Logical   Suicidal Thoughts:  No  Homicidal Thoughts:  No  Memory:  fair  Judgement:  Good  Insight:  Good  Psychomotor Activity:  Normal  Concentration:  Concentration: Good and Attention Span: Fair  Recall:  Fair  Fund of Knowledge: Good  Language: Good  Assets:  Communication Skills Desire for Improvement Financial Resources/Insurance Leisure Time Resilience Social Support Transportation  ADL's:  Intact  Cognition: WNL  Prognosis:  Good   Her PCP follows her labs.  DIAGNOSES:    ICD-10-CM   1. Bipolar II disorder, moderate, depressed, with anxious distress (HCC)  F31.81     2. Generalized anxiety disorder  F41.1 clonazePAM  (KLONOPIN ) 0.5 MG tablet    3. Insomnia, unspecified type  G47.00      Receiving Psychotherapy: No   RECOMMENDATIONS:  PDMP reviewed.  Lyrica filled 10/22/2023.  Modafinil  filled 01/21/2024.  Klonopin  filled 02/17/2024. I provided approximately  20  minutes of face to face time during this encounter, including time spent before and after the visit in records review, medical decision making, counseling pertinent to today's visit, and charting.   Eevie is doing well with the current treatment so no changes are needed.  Continue Klonopin  0.5 mg, 1 p.o. daily as needed anxiety. Continue modafinil  100 mg, 1 p.o. daily.  (See above) Continue Trileptal  150 mg, 1 p.o. twice daily. Continue trazodone  100 mg, 1 p.o. nightly as needed. Continue Effexor  XR 75 mg, 1 p.o. daily. Return in 3 months.  Verneita Cooks, PA-C

## 2024-04-19 ENCOUNTER — Encounter: Payer: Self-pay | Admitting: Obstetrics and Gynecology

## 2024-04-19 ENCOUNTER — Ambulatory Visit (INDEPENDENT_AMBULATORY_CARE_PROVIDER_SITE_OTHER): Admitting: Obstetrics and Gynecology

## 2024-04-19 DIAGNOSIS — Z87440 Personal history of urinary (tract) infections: Secondary | ICD-10-CM | POA: Diagnosis not present

## 2024-04-19 DIAGNOSIS — N3946 Mixed incontinence: Secondary | ICD-10-CM | POA: Diagnosis not present

## 2024-04-19 DIAGNOSIS — N390 Urinary tract infection, site not specified: Secondary | ICD-10-CM

## 2024-04-19 MED ORDER — MIRABEGRON ER 50 MG PO TB24: 50.0000 mg | ORAL_TABLET | Freq: Every day | ORAL | 2 refills | Status: DC

## 2024-04-19 MED ORDER — MIRABEGRON ER 50 MG PO TB24: 50.0000 mg | ORAL_TABLET | Freq: Every day | ORAL | 2 refills | Status: AC

## 2024-04-19 MED ORDER — ESTRADIOL 0.1 MG/GM VA CREA: TOPICAL_CREAM | VAGINAL | 3 refills | Status: AC

## 2024-04-19 NOTE — Progress Notes (Signed)
 [facility] Return Visit  SUBJECTIVE  History of Present Illness: Sarah Hogan is a 63 y.o. female seen in follow-up for OAB and rUTI. Plan at last visit was increase Myrbetriq  to 50mg  daily and consider prophylaxis if needed for rUTI.   Patient reports she is getting up 1-2 times at night. Approximately 1 accident a week when walking the dog. Doing well on the Myrbetriq  50mg .   Patient is not consistent with estrogen cream use. Is taking Cranberry/D-mannose supplementation.    Past Medical History: Patient  has a past medical history of Allergy, Anxiety, Aortic insufficiency, Bipolar 2 disorder (HCC), Cervical polyp (12/16/2018), Depression, Former smoker (12/16/2018), Hyperlipidemia, Leukopenia, Mild aortic insufficiency, Mild mitral regurgitation, MS (multiple sclerosis), Neuromuscular disorder (HCC), Normal coronary arteries (cath 2015), Overactive bladder, PVC's (premature ventricular contractions) (04/09/2016), SVT (supraventricular tachycardia) (04/22/2017), and Vitamin D  deficiency.   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   Medications: She has a current medication list which includes the following prescription(s): baclofen, biotin, cholecalciferol, chromium picolinate, clonazepam , estradiol , ezetimibe , ibuprofen , metoprolol  succinate, mirabegron  er, modafinil , NONFORMULARY OR COMPOUNDED ITEM, oxcarbazepine , phenazopyridine , pregabalin, trazodone , and venlafaxine  xr.   Allergies: Patient is allergic to amoxicillin and statins.   Social History: Patient  reports that she quit smoking about 29 years ago. Her smoking use included cigarettes. She started smoking about 49 years ago. She has a 30 pack-year  smoking history. She has never used smokeless tobacco. She reports current alcohol use. She reports that she does not use drugs.     OBJECTIVE     Physical Exam: Vitals:   04/19/24 1305  BP: 107/61  Pulse: 68   Gen: No apparent distress, A&O x 3.  Detailed Urogynecologic Evaluation:  Deferred.   ASSESSMENT AND PLAN    Ms. Polyakov is a 63 y.o. with:  1. Mixed stress and urge urinary incontinence   2. Recurrent UTI    Continue on her Myrbetriq  50mg  daily as she has had such good improvement in her symptoms.  Patient to try and be more consistent with estrogen use. She has gone a month without a UTI so will hold off on prophylaxis at this time. Patient to continue her D-mannose/Cranberry supplementation. E-string is not covered by insurance so not a valid alternative. Patient encouraged to put a note by her toothbrush as a reminder to do estrogen cream twice a week.   Patient to return if needed for urine testing or will return in 3 months.   Otoniel Myhand G Keon Benscoter, NP

## 2024-04-19 NOTE — Patient Instructions (Signed)
 Continue your Myrbetriq  50mg  daily  Use your estrogen cream twice a week.

## 2024-05-24 ENCOUNTER — Ambulatory Visit (HOSPITAL_BASED_OUTPATIENT_CLINIC_OR_DEPARTMENT_OTHER): Admitting: Obstetrics & Gynecology

## 2024-05-26 ENCOUNTER — Encounter (HOSPITAL_BASED_OUTPATIENT_CLINIC_OR_DEPARTMENT_OTHER): Payer: Self-pay | Admitting: Obstetrics & Gynecology

## 2024-05-26 ENCOUNTER — Ambulatory Visit (HOSPITAL_BASED_OUTPATIENT_CLINIC_OR_DEPARTMENT_OTHER): Admitting: Obstetrics & Gynecology

## 2024-05-26 VITALS — BP 104/62 | HR 82 | Ht 67.0 in | Wt 208.0 lb

## 2024-05-26 DIAGNOSIS — G35D Multiple sclerosis, unspecified: Secondary | ICD-10-CM

## 2024-05-26 DIAGNOSIS — M8588 Other specified disorders of bone density and structure, other site: Secondary | ICD-10-CM

## 2024-05-26 DIAGNOSIS — N3281 Overactive bladder: Secondary | ICD-10-CM

## 2024-05-26 DIAGNOSIS — Z124 Encounter for screening for malignant neoplasm of cervix: Secondary | ICD-10-CM

## 2024-05-26 DIAGNOSIS — Z1151 Encounter for screening for human papillomavirus (HPV): Secondary | ICD-10-CM | POA: Diagnosis not present

## 2024-05-26 DIAGNOSIS — Z1331 Encounter for screening for depression: Secondary | ICD-10-CM

## 2024-05-26 DIAGNOSIS — Z780 Asymptomatic menopausal state: Secondary | ICD-10-CM | POA: Diagnosis not present

## 2024-05-26 DIAGNOSIS — Z01419 Encounter for gynecological examination (general) (routine) without abnormal findings: Secondary | ICD-10-CM

## 2024-05-26 MED ORDER — NONFORMULARY OR COMPOUNDED ITEM: 3 refills | Status: AC

## 2024-05-26 NOTE — Progress Notes (Signed)
 ANNUAL EXAM Patient name: Sarah Hogan MRN [id number]  Date of birth: 1961-01-21 Chief Complaint:   Gynecologic Exam  History of Present Illness:   Sarah Hogan is a 63 y.o. G0P0000 Caucasian female being seen today for a routine annual exam.  Doing well.  On progesterone  SR nightly.  Needs RF.  Cardiologist, Dr. Dorine is following her for hx for SVT.  Having yearly echo to monitor aortic and mitral valve.    Neurologist, Dr. Abou Zeid at [facility], for MS.  Has been fairly stable.  Also followed by urogyn due to OAB.  Somedays this is well controled and sometimes not very well controlled.  Denies vaginal bleeding.    Patient's last menstrual period was 06/28/2008.   Last pap 02/12/2021. Results were: NILM w/ HRHPV not done. H/O abnormal pap: no Last mammogram: 03/26/2024. Results were: normal. Family h/o breast cancer: yes paternal grandmother. Last colonoscopy: 02/16/2020. Results were: abnormal 2 polyps. Family h/o colorectal cancer: no.  Follow up 5 years.   DEXA:  03/2020 [facility] spine -1.7     05/26/2024    3:47 PM 10/28/2023   11:03 AM 10/22/2022   10:35 AM 10/16/2021   10:57 AM 02/12/2021    4:09 PM  Depression screen PHQ 2/9  Decreased Interest 1 0 0 1 0  Down, Depressed, Hopeless 1 0 0 1 0  PHQ - 2 Score 2 0 0 2 0  Altered sleeping 2   1   Tired, decreased energy 2   1   Change in appetite 2   0   Feeling bad or failure about yourself  0   1   Trouble concentrating 0   0   Moving slowly or fidgety/restless 0   1   Suicidal thoughts 0   0   PHQ-9 Score 8    6    Difficult doing work/chores Somewhat difficult         Data saved with a previous flowsheet row definition     Review of Systems:   Pertinent items are noted in HPI Denies any bowel changes.  Urinary issus are stable.   Pertinent History Reviewed:  Reviewed past medical,surgical, social and family history.  Reviewed problem list, medications and allergies. Physical Assessment:   Vitals:   05/26/24 1543   BP: 104/62  Pulse: 82  SpO2: 100%  Weight: 208 lb (94.3 kg)  Height: 5' 7 (1.702 m)  Body mass index is 32.58 kg/m.        Physical Examination:   General appearance - well appearing, and in no distress  Mental status - alert, oriented to person, place, and time  Psych:  She has a normal mood and affect  Skin - warm and dry, normal color, no suspicious lesions noted  Chest - effort normal, all lung fields clear to auscultation bilaterally  Heart - normal rate and regular rhythm  Neck:  midline trachea, no thyromegaly or nodules  Breasts - breasts appear normal, no suspicious masses, no skin or nipple changes or  axillary nodes  Abdomen - soft, nontender, nondistended, no masses or organomegaly  Pelvic - VULVA: normal appearing vulva with no masses, tenderness or lesions   VAGINA: normal appearing vagina with normal color and discharge, no lesions   CERVIX: normal appearing cervix without discharge or lesions, no CMT  Thin prep pap is updated today with HR HPV  UTERUS: uterus is felt to be normal size, shape, consistency and nontender   ADNEXA: No adnexal masses or  tenderness noted.  Rectal - normal rectal, good sphincter tone, no masses felt.  Extremities:  No swelling or varicosities noted  Chaperone present for exam  No results found for this or any previous visit (from the past 24 hours).  Assessment & Plan:  1. Well woman exam with routine gynecological exam (Primary) - Pap smear obtained today with HR HPV - Mammogram 03/26/2024 - Colonoscopy 02/16/2020 - Bone mineral density 2021 - lab work done with PCP, Camie Early - vaccines reviewed/updated  2. Cervical cancer screening - Cytology - PAP( [place])  3. Postmenopausal - NONFORMULARY OR COMPOUNDED ITEM; Compounded progesterone  SR 100mg .  1 capsule daily.  Dispense: 90 each; Refill: 3  4. Overactive bladder - on Myrbetriq   5. MS (multiple sclerosis)   No orders of the defined types were placed in this  encounter.   Meds:  Meds ordered this encounter  Medications   NONFORMULARY OR COMPOUNDED ITEM    Sig: Compounded progesterone  SR 100mg .  1 capsule daily.    Dispense:  90 each    Refill:  3    Follow-up: f/u 1-2 years  Ronal GORMAN Pinal, MD 05/30/2024 1:55 PM

## 2024-05-27 LAB — CYTOLOGY - PAP
Comment: NEGATIVE
Diagnosis: NEGATIVE
High risk HPV: NEGATIVE

## 2024-05-30 ENCOUNTER — Ambulatory Visit (HOSPITAL_BASED_OUTPATIENT_CLINIC_OR_DEPARTMENT_OTHER): Payer: Self-pay | Admitting: Obstetrics & Gynecology

## 2024-05-30 DIAGNOSIS — M8588 Other specified disorders of bone density and structure, other site: Secondary | ICD-10-CM | POA: Insufficient documentation

## 2024-06-30 ENCOUNTER — Ambulatory Visit: Admitting: Physician Assistant

## 2024-06-30 ENCOUNTER — Encounter: Payer: Self-pay | Admitting: Physician Assistant

## 2024-06-30 DIAGNOSIS — F411 Generalized anxiety disorder: Secondary | ICD-10-CM

## 2024-06-30 DIAGNOSIS — F3181 Bipolar II disorder: Secondary | ICD-10-CM

## 2024-06-30 DIAGNOSIS — G4700 Insomnia, unspecified: Secondary | ICD-10-CM | POA: Diagnosis not present

## 2024-06-30 MED ORDER — OXCARBAZEPINE 150 MG PO TABS: ORAL_TABLET | ORAL | 1 refills | Status: AC

## 2024-06-30 NOTE — Progress Notes (Unsigned)
 [facility]  Patient ID: Sarah Hogan,  MRN: [id number]  PCP: Oris Camie BRAVO, NP  Date of Evaluation: 06/30/2024 Time spent:20 minutes  Chief Complaint:  Chief Complaint   Anxiety; Depression; Insomnia; Follow-up    HISTORY/CURRENT STATUS: HPI For routine f/u  Hasn't been sleeping well.  She's taken Traz 50 mg, if she takes more, she has a hangover the next morning. She takes a 1/2 modafinil  which helps her 'be present' but no increased energy. She hasn't felt well for the past few days, especially yesterday. I call it an MS day..  Has those occas where she just doesn't feel good. There's a lot going on. Has changed churches which has been stressful. She spent about $1,000 Good Friday weekend, which is very unlike her.  She has not been manic in a very long time but knows the insomnia and spending can be signs of it.  Individual Medical History/ Review of Systems: Changes? :Yes     Past medications for mental health diagnoses include: Geodon -Has been helpful for her depression. Worsening TD on 20 mg.  Latuda - Effective. Took samples.  Seroquel- Took briefly. Had severe fatigue Effexor - Taken for several years Zoloft- Was effective and then stopped working Prozac Remeron- Lost 80 lbs when she stopped taking it. Buspar Trileptal - Helps stabilize mood. Has taken long-term. Lamictal -Worsening depression Tranxene  Provigil  Trazodone   Allergies: Amoxicillin and Statins  Current Medications:  Current Outpatient Medications:    baclofen (LIORESAL) 10 MG tablet, Take 10 mg by mouth 2 (two) times daily., Disp: , Rfl:    Biotin 5000 MCG CAPS, Take by mouth., Disp: , Rfl:    cholecalciferol (VITAMIN D3) 25 MCG (1000 UNIT) tablet, Take 1,000 Units by mouth daily., Disp: , Rfl:    Chromium Picolinate (CHROMIUM PICOLATE PO), Take by mouth., Disp: , Rfl:    clonazePAM  (KLONOPIN ) 0.5 MG tablet, Take 1 tablet (0.5 mg total) by mouth daily as needed for anxiety., Disp: 30  tablet, Rfl: 5   estradiol  (ESTRACE ) 0.1 MG/GM vaginal cream, Place 1g nightly for two weeks then twice a week after, Disp: 42 g, Rfl: 3   ezetimibe  (ZETIA ) 10 MG tablet, Take 1 tablet (10 mg total) by mouth daily., Disp: 90 tablet, Rfl: 3   ibuprofen  (ADVIL ) 800 MG tablet, Take 1 tablet (800 mg total) by mouth every 8 (eight) hours as needed., Disp: 90 tablet, Rfl: 1   metoprolol  succinate (TOPROL -XL) 50 MG 24 hr tablet, TAKE 1 TABLET BY MOUTH DAILY TAKE WITH OR IMMEDIATELY FOLLOWING A MEAL, Disp: 90 tablet, Rfl: 1   mirabegron  ER (MYRBETRIQ ) 50 MG TB24 tablet, Take 1 tablet (50 mg total) by mouth daily., Disp: 90 tablet, Rfl: 2   modafinil  (PROVIGIL ) 100 MG tablet, Take 1 tablet (100 mg total) by mouth daily as needed (Driving)., Disp: 30 tablet, Rfl: 5   NONFORMULARY OR COMPOUNDED ITEM, Compounded progesterone  SR 100mg .  1 capsule daily., Disp: 90 each, Rfl: 3   phenazopyridine  (PYRIDIUM ) 200 MG tablet, Take 1 tablet (200 mg total) by mouth 3 (three) times daily as needed for pain., Disp: 10 tablet, Rfl: 0   pregabalin (LYRICA) 50 MG capsule, Take 50 mg by mouth 2 (two) times daily., Disp: , Rfl:    traZODone  (DESYREL ) 100 MG tablet, Take 1 tablet (100 mg total) by mouth at bedtime as needed for sleep. (Patient taking differently: Take 50 mg by mouth at bedtime as needed for sleep.), Disp: 90 tablet, Rfl: 1   venlafaxine  XR (EFFEXOR -XR) 75 MG 24  hr capsule, TAKE 1 CAPSULE BY MOUTH DAILY WITH BREAKFAST, Disp: 90 capsule, Rfl: 1   OXcarbazepine  (TRILEPTAL ) 150 MG tablet, 1 po q am and 2 po at bedtime., Disp: 270 tablet, Rfl: 1 Medication Side Effects: none  Family Medical/ Social History: Changes? No    MENTAL HEALTH EXAM:  Last menstrual period 06/28/2008.There is no height or weight on file to calculate BMI.  General Appearance: Casual and Well Groomed  Eye Contact:  Good  Speech:  Clear and Coherent and Normal Rate  Volume:  Normal  Mood:  Euthymic  Affect:  Congruent  Thought Process:   Goal Directed and Descriptions of Associations: Circumstantial  Orientation:  Full (Time, Place, and Person)  Thought Content: Logical   Suicidal Thoughts:  No  Homicidal Thoughts:  No  Memory:  fair  Judgement:  Good  Insight:  Good  Psychomotor Activity:  Normal  Concentration:  Concentration: Good and Attention Span: Fair  Recall:  Fair  Fund of Knowledge: Good  Language: Good  Assets:  Communication Skills Desire for Improvement Financial Resources/Insurance Leisure Time Resilience Social Support Transportation  ADL's:  Intact  Cognition: WNL  Prognosis:  Good   Her PCP follows her labs.  DIAGNOSES:    ICD-10-CM   1. Bipolar 2 disorder (HCC)  F31.81 OXcarbazepine  (TRILEPTAL ) 150 MG tablet      Receiving Psychotherapy: No   RECOMMENDATIONS:  PDMP reviewed.  Lyrica filled 10/22/2023.  Modafinil  filled 01/21/2024.  Klonopin  filled 02/17/2024.     Continue Klonopin  0.5 mg, 1 p.o. daily as needed anxiety. Continue modafinil  100 mg, 1 p.o. daily.  (See above) Continue Trileptal  150 mg, 1 p.o. twice daily. Continue trazodone  100 mg, 1 p.o. nightly as needed. Continue Effexor  XR 75 mg, 1 p.o. daily. Return in      [place], PA-C

## 2024-07-19 ENCOUNTER — Ambulatory Visit: Admitting: Obstetrics and Gynecology

## 2024-07-19 VITALS — BP 122/66 | HR 65

## 2024-07-19 DIAGNOSIS — N3281 Overactive bladder: Secondary | ICD-10-CM | POA: Diagnosis not present

## 2024-07-19 DIAGNOSIS — Z87440 Personal history of urinary (tract) infections: Secondary | ICD-10-CM

## 2024-07-19 DIAGNOSIS — N390 Urinary tract infection, site not specified: Secondary | ICD-10-CM

## 2024-07-19 MED ORDER — MIRABEGRON ER 50 MG PO TB24: 50.0000 mg | ORAL_TABLET | Freq: Every day | ORAL | 5 refills | Status: AC

## 2024-07-19 NOTE — Progress Notes (Signed)
 [facility] Return Visit  SUBJECTIVE  History of Present Illness: Sarah Hogan is a 63 y.o. female seen in follow-up for OAB and rUTI. Plan at last visit was use estrogen cream twice a week and consider starting prophylaxis if needed. Her Myrbetriq  was also increased to 50mg  which she had a good response to.   Patient reports she does still have some accidents but they are nowhere near as frequent as they were prior to Myrbetriq  being increased.   Patient also states she has had no UTI's for 4 months.     Past Medical History: Patient  has a past medical history of Allergy, Anxiety, Aortic insufficiency, Bipolar 2 disorder (HCC), Cervical polyp (12/16/2018), Depression, Former smoker (12/16/2018), Hyperlipidemia, Leukopenia, Mild aortic insufficiency, Mild mitral regurgitation, MS (multiple sclerosis), Neuromuscular disorder (HCC), Normal coronary arteries (cath 2015), Overactive bladder, PVC's (premature ventricular contractions) (04/09/2016), SVT (supraventricular tachycardia) (04/22/2017), and Vitamin D  deficiency.   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   Medications: She has a current medication list which includes the following prescription(s): baclofen, biotin, cholecalciferol, chromium picolinate, clonazepam , estradiol , ezetimibe , ibuprofen , metoprolol  succinate, mirabegron  er, mirabegron  er, modafinil , NONFORMULARY OR COMPOUNDED ITEM, oxcarbazepine , phenazopyridine , pregabalin, trazodone , and venlafaxine  xr.   Allergies: Patient is allergic to amoxicillin and statins.   Social History: Patient  reports that she quit smoking about 29 years ago. Her smoking use included cigarettes. She started smoking  about 49 years ago. She has a 30 pack-year smoking history. She has never used smokeless tobacco. She reports current alcohol use. She reports that she does not use drugs.     OBJECTIVE     Physical Exam: Vitals:   07/19/24 1337  BP: 122/66  Pulse: 65   Gen: No apparent distress, A&O x 3.  Detailed Urogynecologic Evaluation:  Deferred.   ASSESSMENT AND PLAN    Sarah Hogan is a 64 y.o. with:  1. Recurrent UTI   2. OAB (overactive bladder)    Patient is doing well on her current regimen of vaginal estrogen cream. Patient to continue using this twice a week for prevention of UTI.  Patient to continue Myrbetriq  50mg  ER daily. She is doing better overall with the Myrbetriq  than without, but it is not as supportive of a medication as the Gemtesa  75mg  daily.   Patient to follow up in 6 months or sooner if needed. Patient to call if she has UTI symptoms.    Kaylana Fenstermacher G Karesha Trzcinski, NP

## 2024-08-02 ENCOUNTER — Encounter: Payer: Self-pay | Admitting: *Deleted

## 2024-08-09 ENCOUNTER — Telehealth: Payer: Self-pay

## 2024-08-09 NOTE — Telephone Encounter (Signed)
 Patient called stating her insurance will be sending paperwork to the office regarding tier reduction for Mirabegron . She is requesting a 30 days supply of medication until her issues resolved.

## 2024-08-09 NOTE — Telephone Encounter (Signed)
 She is requesting 30 days of Gemtesa  or Myrbetriq ? Kaitlin already sent in the Myrbetriq  for her.

## 2024-08-11 NOTE — Telephone Encounter (Signed)
 She's requesting 30 days of samples of Gemtesa 

## 2024-08-13 ENCOUNTER — Ambulatory Visit: Admitting: Physician Assistant

## 2024-08-13 ENCOUNTER — Encounter: Payer: Self-pay | Admitting: Physician Assistant

## 2024-08-13 DIAGNOSIS — F411 Generalized anxiety disorder: Secondary | ICD-10-CM | POA: Diagnosis not present

## 2024-08-13 DIAGNOSIS — F3181 Bipolar II disorder: Secondary | ICD-10-CM

## 2024-08-13 DIAGNOSIS — F319 Bipolar disorder, unspecified: Secondary | ICD-10-CM | POA: Diagnosis not present

## 2024-08-13 DIAGNOSIS — G4700 Insomnia, unspecified: Secondary | ICD-10-CM | POA: Diagnosis not present

## 2024-08-13 NOTE — Progress Notes (Signed)
 "     [facility]  Patient ID: Sarah Hogan,  MRN: [id number]  PCP: Oris Camie BRAVO, NP  Date of Evaluation: 08/13/2024 Time spent:20 minutes  Chief Complaint:  Chief Complaint   Anxiety; Depression; Follow-up    HISTORY/CURRENT STATUS: HPI For f/u after increasing the Trileptal .   She's sleeping better. Alphonse causes too much drowsiness the next day, and she's now taking Melatonin. Increasing the Trileptal  may be helping as well.  She had been a little manic at the last visit and had spent more money than she should have.  She ended up taking a lot of the items back to the store and getting a refund.  No manic symptoms are reported.  No delirium, or psychosis.  She does feel anxious, more so a feeling of being overwhelmed rather than having panic attacks.  The Klonopin  is helpful when needed. No extreme sadness, tearfulness, or feelings of hopelessness.  ADLs and personal hygiene are normal.   Denies any changes in concentration, making decisions, or remembering things.  Appetite has not changed.  Weight is stable.  No SI/HI.  Individual Medical History/ Review of Systems: Changes? :No    Past medications for mental health diagnoses include: Geodon -Has been helpful for her depression. Worsening TD on 20 mg.  Latuda - Effective. Took samples.  Seroquel- Took briefly. Had severe fatigue Effexor - Taken for several years Zoloft- Was effective and then stopped working Prozac Remeron- Lost 80 lbs when she stopped taking it. Buspar Trileptal - Helps stabilize mood. Has taken long-term. Lamictal -Worsening depression Tranxene  Provigil  Trazodone   Allergies: Amoxicillin and Statins  Current Medications:  Current Outpatient Medications:    baclofen (LIORESAL) 10 MG tablet, Take 10 mg by mouth 2 (two) times daily., Disp: , Rfl:    Biotin 5000 MCG CAPS, Take by mouth., Disp: , Rfl:    cholecalciferol (VITAMIN D3) 25 MCG (1000 UNIT) tablet, Take 1,000 Units by mouth daily., Disp: , Rfl:     Chromium Picolinate (CHROMIUM PICOLATE PO), Take by mouth., Disp: , Rfl:    clonazePAM  (KLONOPIN ) 0.5 MG tablet, Take 1 tablet (0.5 mg total) by mouth daily as needed for anxiety., Disp: 30 tablet, Rfl: 5   estradiol  (ESTRACE ) 0.1 MG/GM vaginal cream, Place 1g nightly for two weeks then twice a week after, Disp: 42 g, Rfl: 3   ezetimibe  (ZETIA ) 10 MG tablet, Take 1 tablet (10 mg total) by mouth daily., Disp: 90 tablet, Rfl: 3   ibuprofen  (ADVIL ) 800 MG tablet, Take 1 tablet (800 mg total) by mouth every 8 (eight) hours as needed., Disp: 90 tablet, Rfl: 1   MELATONIN PO, Take by mouth., Disp: , Rfl:    metoprolol  succinate (TOPROL -XL) 50 MG 24 hr tablet, TAKE 1 TABLET BY MOUTH DAILY TAKE WITH OR IMMEDIATELY FOLLOWING A MEAL, Disp: 90 tablet, Rfl: 1   mirabegron  ER (MYRBETRIQ ) 50 MG TB24 tablet, Take 1 tablet (50 mg total) by mouth daily., Disp: 90 tablet, Rfl: 2   mirabegron  ER (MYRBETRIQ ) 50 MG TB24 tablet, Take 1 tablet (50 mg total) by mouth daily., Disp: 90 tablet, Rfl: 5   modafinil  (PROVIGIL ) 100 MG tablet, Take 1 tablet (100 mg total) by mouth daily as needed (Driving)., Disp: 30 tablet, Rfl: 5   NONFORMULARY OR COMPOUNDED ITEM, Compounded progesterone  SR 100mg .  1 capsule daily., Disp: 90 each, Rfl: 3   OXcarbazepine  (TRILEPTAL ) 150 MG tablet, 1 po q am and 2 po at bedtime., Disp: 270 tablet, Rfl: 1   phenazopyridine  (PYRIDIUM ) 200 MG  tablet, Take 1 tablet (200 mg total) by mouth 3 (three) times daily as needed for pain., Disp: 10 tablet, Rfl: 0   pregabalin (LYRICA) 50 MG capsule, Take 50 mg by mouth 2 (two) times daily., Disp: , Rfl:    venlafaxine  XR (EFFEXOR -XR) 75 MG 24 hr capsule, TAKE 1 CAPSULE BY MOUTH DAILY WITH BREAKFAST, Disp: 90 capsule, Rfl: 1   traZODone  (DESYREL ) 100 MG tablet, Take 1 tablet (100 mg total) by mouth at bedtime as needed for sleep., Disp: 90 tablet, Rfl: 1 Medication Side Effects: none  Family Medical/ Social History: Changes? No    MENTAL HEALTH  EXAM:  Last menstrual period 06/28/2008.There is no height or weight on file to calculate BMI.  General Appearance: Casual and Well Groomed  Eye Contact:  Good  Speech:  Clear and Coherent and Normal Rate  Volume:  Normal  Mood:  Euthymic  Affect:  Congruent  Thought Process:  Goal Directed and Descriptions of Associations: Circumstantial  Orientation:  Full (Time, Place, and Person)  Thought Content: Logical   Suicidal Thoughts:  No  Homicidal Thoughts:  No  Memory:  fair  Judgement:  Good  Insight:  Good  Psychomotor Activity:  Normal  Concentration:  Concentration: Good and Attention Span: Fair  Recall:  Fair  Fund of Knowledge: Good  Language: Good  Assets:  Communication Skills Desire for Improvement Financial Resources/Insurance Housing Leisure Time Resilience Transportation  ADL's:  Intact  Cognition: WNL  Prognosis:  Good   Her PCP follows her labs.  DIAGNOSES:    ICD-10-CM   1. Bipolar 2 disorder (HCC)  F31.81     2. Bipolar I disorder (HCC)  F31.9     3. Generalized anxiety disorder  F41.1     4. Insomnia, unspecified type  G47.00       Receiving Psychotherapy: No   RECOMMENDATIONS:  PDMP reviewed.  Klonopin  filled 06/26/2024.  Modafinil  filled 05/28/2024.  Lyrica filled 03/27/2024. I provided approximately 20  minutes of face to face time during this encounter, including time spent before and after the visit in records review, medical decision making, counseling pertinent to today's visit, and charting.   She is better since we increased the Trileptal .  No changes are needed.  Continue Klonopin  0.5 mg, 1 p.o. daily as needed anxiety. Continue modafinil  100 mg, 1 p.o. daily.  Continue Melatonin prn.  Continue Trileptal  150 mg, 1 p.o. every morning and 2 p.o. nightly.   Continue Effexor  XR 75 mg, 1 p.o. daily. Return in 2 months.  Verneita Cooks, PA-C  "

## 2024-08-17 NOTE — Telephone Encounter (Signed)
 Attempted to contact patient. [facility]

## 2024-08-25 NOTE — Telephone Encounter (Signed)
 Patient was notified and is supposed to pick up medication samples.

## 2024-10-14 ENCOUNTER — Ambulatory Visit: Admitting: Physician Assistant

## 2024-11-08 ENCOUNTER — Ambulatory Visit: Payer: Self-pay | Admitting: Nurse Practitioner
